# Patient Record
Sex: Male | Born: 1945
Health system: Southern US, Community
[De-identification: ages and names within clinical notes are randomized; demographics above are authoritative.]

## PROBLEM LIST (undated history)

## (undated) DIAGNOSIS — Z95810 Presence of automatic (implantable) cardiac defibrillator: Secondary | ICD-10-CM

## (undated) DIAGNOSIS — I10 Essential (primary) hypertension: Secondary | ICD-10-CM

## (undated) DIAGNOSIS — I509 Heart failure, unspecified: Secondary | ICD-10-CM

## (undated) DIAGNOSIS — K635 Polyp of colon: Secondary | ICD-10-CM

## (undated) DIAGNOSIS — E785 Hyperlipidemia, unspecified: Secondary | ICD-10-CM

## (undated) DIAGNOSIS — I428 Other cardiomyopathies: Secondary | ICD-10-CM

## (undated) DIAGNOSIS — I4891 Unspecified atrial fibrillation: Secondary | ICD-10-CM

## (undated) DIAGNOSIS — Z950 Presence of cardiac pacemaker: Secondary | ICD-10-CM

## (undated) DIAGNOSIS — I499 Cardiac arrhythmia, unspecified: Secondary | ICD-10-CM

## (undated) DIAGNOSIS — G459 Transient cerebral ischemic attack, unspecified: Secondary | ICD-10-CM

## (undated) DIAGNOSIS — E119 Type 2 diabetes mellitus without complications: Secondary | ICD-10-CM

## (undated) DIAGNOSIS — C61 Malignant neoplasm of prostate: Secondary | ICD-10-CM

## (undated) DIAGNOSIS — I639 Cerebral infarction, unspecified: Secondary | ICD-10-CM

## (undated) HISTORY — PX: PROSTATECTOMY: SHX69

## (undated) HISTORY — DX: Malignant neoplasm of prostate: C61

## (undated) HISTORY — DX: Essential (primary) hypertension: I10

## (undated) HISTORY — PX: CARDIAC CATHETERIZATION: SHX172

## (undated) HISTORY — DX: Unspecified atrial fibrillation: I48.91

## (undated) HISTORY — DX: Presence of automatic (implantable) cardiac defibrillator: Z95.810

## (undated) HISTORY — DX: Heart failure, unspecified: I50.9

## (undated) HISTORY — DX: Cerebral infarction, unspecified: I63.9

## (undated) HISTORY — PX: BACK SURGERY: SHX140

## (undated) HISTORY — PX: COLONOSCOPY: SHX174

## (undated) HISTORY — DX: Transient cerebral ischemic attack, unspecified: G45.9

## (undated) HISTORY — DX: Polyp of colon: K63.5

## (undated) HISTORY — DX: Hyperlipidemia, unspecified: E78.5

## (undated) HISTORY — DX: Other cardiomyopathies: I42.8

## (undated) HISTORY — DX: Type 2 diabetes mellitus without complications: E11.9

---

## 2010-09-11 HISTORY — PX: ICD IMPLANT: EP1208

## 2014-10-28 ENCOUNTER — Encounter: Payer: Self-pay | Admitting: Family Medicine

## 2016-09-27 ENCOUNTER — Ambulatory Visit: Payer: Self-pay | Admitting: Family Medicine

## 2016-10-11 ENCOUNTER — Encounter: Payer: Self-pay | Admitting: Family Medicine

## 2016-10-11 ENCOUNTER — Ambulatory Visit (INDEPENDENT_AMBULATORY_CARE_PROVIDER_SITE_OTHER): Payer: Managed Care, Other (non HMO) | Admitting: Family Medicine

## 2016-10-11 VITALS — BP 160/82 | HR 60 | Temp 97.7°F | Ht 73.0 in | Wt 251.0 lb

## 2016-10-11 DIAGNOSIS — I10 Essential (primary) hypertension: Secondary | ICD-10-CM

## 2016-10-11 DIAGNOSIS — I5022 Chronic systolic (congestive) heart failure: Secondary | ICD-10-CM | POA: Insufficient documentation

## 2016-10-11 DIAGNOSIS — I480 Paroxysmal atrial fibrillation: Secondary | ICD-10-CM | POA: Insufficient documentation

## 2016-10-11 DIAGNOSIS — Z23 Encounter for immunization: Secondary | ICD-10-CM | POA: Diagnosis not present

## 2016-10-11 DIAGNOSIS — Z8546 Personal history of malignant neoplasm of prostate: Secondary | ICD-10-CM

## 2016-10-11 DIAGNOSIS — Z95810 Presence of automatic (implantable) cardiac defibrillator: Secondary | ICD-10-CM | POA: Insufficient documentation

## 2016-10-11 MED ORDER — PRADAXA 150 MG PO CAPS: 150.0000 mg | ORAL_CAPSULE | Freq: Two times a day (BID) | ORAL | 3 refills | Status: DC

## 2016-10-11 NOTE — Patient Instructions (Signed)
It was a pleasure to see you today- we will look forward to taking care of you in the future.  I will get you referred to see a cardiologist  We will also work on getting your past medical records Take care and let me know if any concerns

## 2016-10-11 NOTE — Progress Notes (Signed)
Pre visit review using our clinic review tool, if applicable. No additional management support is needed unless otherwise documented below in the visit note. 

## 2016-10-11 NOTE — Progress Notes (Signed)
[facility] [address], [place], [place] [address] [phone]  Date:  10/11/2016   Name:  Bobby Bradley   DOB:  13-Aug-1946   MRN:  [id number]  PCP:  No primary care provider on file.    Chief Complaint: Referral (Pt needs referral to cardio. Pt just moved to the area from [place]. Flu vaccine today. )   History of Present Illness:  Bobby Bradley is a 71 y.o. very pleasant male patient who presents with the following:  Here today as a new patient- no notes in Epic for this pt Moved here from [place] in July.  His daughter and grands live here.   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  Heart evaluation at that time was normal.   After the air [employer] he had a back operation (disectomy and laminectomy at L5/s1), repeated a year later In 2003 he was dx with prostate cancer- he had a prostatectomy and then a mesh implant. No other treatment needed. He does annual PSA testing and has been at 0 since his operation.  He no longer sees urology  In 2012 he had TIA x2 and was found to have a fib.  He is on pradaxa for this.  He was also noted to have a reduced EF of about 35% around that time His most recent echo was about 2 years ago  He did not have an ablation- no sx of his a fib.  He goes in and out of a fib but does not have any sx of same.  He is really not bothered by this. He does not have nay CP.  He will sometimes feel like his stamina and breath is not as good as it might be  He does have an ICD Unsure of date of last colonoscopy-  5-6 years ago per his recollection He had labs done around the time of his move- does not think  he is due yet today  No bleeding with his pradaza Flu shot today  He does not that he tends to get tired more easily due to his heart condition.  He is bothered by the heat more easily than in the past   Patient Active Problem List   Diagnosis Date Noted  . Paroxysmal atrial fibrillation ([place]) 10/11/2016  . Chronic systolic congestive heart failure ([place]) 10/11/2016  . ICD (implantable cardioverter-defibrillator) in place 10/11/2016    Past Medical History:  Diagnosis Date  . A-fib ([place])   . CHF (congestive heart failure) ([place])   . Essential hypertension   . Non-ischemic cardiomyopathy ([place])   . Presence of combination internal cardiac defibrillator (ICD) and pacemaker   . Prostate cancer [facility])     Past Surgical History:  Procedure Laterality Date  . BACK SURGERY    . PROSTATECTOMY      Social History  Substance Use Topics  . Smoking status: Former Smoker    Quit date: 09/12/1983  . Smokeless tobacco: Never Used  . Alcohol use Not on file     Comment: very rare    Family History  Problem Relation Age of Onset  . Lung cancer Mother   . Uterine cancer Mother   . Lung cancer Father   . Lung cancer Brother     No Known Allergies  Medication list has been reviewed and updated.  No current outpatient prescriptions on file prior to visit.   No current facility-administered medications on file prior to visit.     Review of Systems:  As per HPI- otherwise negative.  No chest pain Admits that his BP has been 'up and down" recently.  He does not check his BP at home He has not noted any foot or leg swelling or other sign of fluid retention   Physical Examination: Blood pressure (!) 160/82, pulse 60, temperature 97.7 F (36.5 C), temperature source Oral, height 6\' 1"  (1.854 m), weight 251 lb (113.9 kg), SpO2 95 %.  Vitals:   10/11/16 1058  Weight: 251 lb (113.9 kg)  Height: 6\' 1"  (1.854 m)   Body mass index is 33.12 kg/m. Ideal Body Weight: Weight in (lb)  to have BMI = 25: 189.1  GEN: WDWN, NAD, Non-toxic, A & O x 3, overweight, looks well HEENT: Atraumatic, Normocephalic. Neck supple. No masses, No LAD.  Bilateral TM wnl, oropharynx normal.  PEERL,EOMI.   Ears and Nose: No external deformity. CV: RRR, No M/G/R. No JVD. No thrill. No extra heart sounds.  At this time seems to be in NSR with rate of approx 60 PULM: CTA B, no wheezes, crackles, rhonchi. No retractions. No resp. distress. No accessory muscle use. EXTR: No c/c/e NEURO Normal gait.  PSYCH: Normally interactive. Conversant. Not depressed or anxious appearing.  Calm demeanor.    Assessment and Plan: Paroxysmal atrial fibrillation ([place]) - Plan: Ambulatory referral to Cardiology, PRADAXA 150 MG CAPS capsule  History of prostate cancer  Chronic systolic congestive heart failure ([place]) - Plan: Ambulatory referral to Cardiology  ICD (implantable cardioverter-defibrillator) in place  Essential hypertension   Here today as a new patient to establish care Will obtain his medical records from [place] Refilled his pradaza for a fib He needs to establish care with cardiology- referral made today His BP is higher than we would like.  We have referred him to cardiology. However if appt is more than a month or so away he will see me for a BP recheck in the meantime    Signed Lamar Blinks, MD

## 2016-10-19 NOTE — Progress Notes (Signed)
HPI: 71 yo male for evaluation of atrial fibrillation and CHF. Also with prior ICD. Recently moved from [place]. Patient apparently had significant ectopy in the past and had catheterization revealing no coronary disease. He later had a TIA and was diagnosed with a cardiomyopathy and paroxysmal atrial fibrillation. I do not have records available. He moved to this area in July and presents to establish. He has some fatigue but denies dyspnea on exertion, orthopnea, PND, pedal edema, chest pain or syncope. No bleeding.  Current Outpatient Prescriptions  Medication Sig Dispense Refill  . carvedilol (COREG) 12.5 MG tablet Take 1 tablet by mouth 2 (two) times daily.    Marland Kitchen losartan (COZAAR) 100 MG tablet Take 1 tablet by mouth daily.  0  . PRADAXA 150 MG CAPS capsule Take 1 capsule (150 mg total) by mouth 2 (two) times daily. 180 capsule 3  . spironolactone (ALDACTONE) 25 MG tablet Take 1 tablet by mouth daily.  1   No current facility-administered medications for this visit.     No Known Allergies   Past Medical History:  Diagnosis Date  . A-fib ([place])   . CHF (congestive heart failure) ([place])   . Essential hypertension   . Hyperlipidemia   . Non-ischemic cardiomyopathy ([place])   . Presence of combination internal cardiac defibrillator (ICD) and pacemaker   . Prostate cancer ([place])   . TIA (transient ischemic attack)     Past Surgical History:  Procedure Laterality Date  . BACK SURGERY    . PROSTATECTOMY      Social History   Social History  . Marital status: Married    Spouse name: N/A  . Number of children: 7  . Years of education: N/A   Occupational History  . Art therapist     Social History Main Topics  . Smoking status: Former Smoker    Quit date: 09/12/1983  . Smokeless tobacco: Never Used  . Alcohol use Yes     Comment: very rare  . Drug use: No  . Sexual activity: Not on file   Other Topics Concern  . Not on file   Social History Narrative    . No narrative on file    Family History  Problem Relation Age of Onset  . Lung cancer Mother   . Uterine cancer Mother   . Lung cancer Father   . Lung cancer Brother   . Atrial fibrillation Sister     ROS: Some fatigue but no fevers or chills, productive cough, hemoptysis, dysphasia, odynophagia, melena, hematochezia, dysuria, hematuria, rash, seizure activity, orthopnea, PND, pedal edema, claudication. Remaining systems are negative.  Physical Exam:   Blood pressure (!) 156/83, pulse 63, height 6\' 1"  (1.854 m), weight 251 lb 12.8 oz (114.2 kg).  General:  Well developed/well nourished in NAD Skin warm/dry Patient not depressed No peripheral clubbing Back-normal HEENT-normal/normal eyelids Neck supple/normal carotid upstroke bilaterally; no bruits; no JVD; no thyromegaly chest - CTA/ normal expansion, ICD left chest  CV - RRR/normal S1 and S2; no murmurs, rubs or gallops;  PMI nondisplaced Abdomen -NT/ND, no HSM, no mass, + bowel sounds, positive bruit 2+ femoral pulses, no bruits Ext-no edema, chords, 2+ DP Neuro-grossly nonfocal  ECG -sinus rhythm at a rate of 63. Normal axis. First-degree AV block.  A/P  1 paroxysmal atrial fibrillation-patient is in sinus rhythm today. Continue beta blocker. Continue pradaxa. CHADsvasc 5. Check hemoglobin and renal function. We will obtain all records from [place].  2 nonischemic cardiomyopathy-obtain  all records. Continue ARB and beta blocker. Continue spironolactone. Check potassium and renal function.  3 ICD-I will arrange an evaluation by electrophysiology to follow.  4 bruit-schedule  5 hypertension-blood pressure is elevated. Add amlodipine 5 mg daily and follow.  Kirk Ruths, MD

## 2016-10-25 ENCOUNTER — Encounter: Payer: Self-pay | Admitting: Cardiology

## 2016-10-25 ENCOUNTER — Ambulatory Visit (INDEPENDENT_AMBULATORY_CARE_PROVIDER_SITE_OTHER): Payer: Managed Care, Other (non HMO) | Admitting: Cardiology

## 2016-10-25 VITALS — BP 156/83 | HR 63 | Ht 73.0 in | Wt 251.8 lb

## 2016-10-25 DIAGNOSIS — I4891 Unspecified atrial fibrillation: Secondary | ICD-10-CM

## 2016-10-25 DIAGNOSIS — I10 Essential (primary) hypertension: Secondary | ICD-10-CM

## 2016-10-25 DIAGNOSIS — R0989 Other specified symptoms and signs involving the circulatory and respiratory systems: Secondary | ICD-10-CM

## 2016-10-25 DIAGNOSIS — I420 Dilated cardiomyopathy: Secondary | ICD-10-CM

## 2016-10-25 MED ORDER — AMLODIPINE BESYLATE 5 MG PO TABS
5.0000 mg | ORAL_TABLET | Freq: Every day | ORAL | 3 refills | Status: DC
Start: 2016-10-25 — End: 2017-10-08

## 2016-10-25 NOTE — Patient Instructions (Signed)
Medication Instructions:   START AMLODIPINE 5 MG ONCE DAILY  Labwork:  Your physician recommends that you HAVE LAB WORK TODAY  Testing/Procedures:  Your physician has requested that you have an abdominal aorta duplex. During this test, an ultrasound is used to evaluate the aorta. Allow 30 minutes for this exam. Do not eat after midnight the day before and avoid carbonated beverages   Follow-Up:  Your physician wants you to follow-up in: [place] will receive a reminder letter in the mail two months in advance. If you don't receive a letter, please call our office to schedule the follow-up appointment.   APPOINTMENT WITH DR CAMNITZ TO ESTABLISH TO FOLLOW ICD

## 2016-10-26 LAB — CBC
HEMOGLOBIN: 15.2 g/dL (ref 13.0–17.7)
Hematocrit: 43.8 % (ref 37.5–51.0)
MCH: 30.6 pg (ref 26.6–33.0)
MCHC: 34.7 g/dL (ref 31.5–35.7)
MCV: 88 fL (ref 79–97)
Platelets: 241 10*3/uL (ref 150–379)
RBC: 4.96 x10E6/uL (ref 4.14–5.80)
RDW: 14 % (ref 12.3–15.4)
WBC: 8 10*3/uL (ref 3.4–10.8)

## 2016-10-26 LAB — BASIC METABOLIC PANEL
BUN/Creatinine Ratio: 18 (ref 10–24)
BUN: 16 mg/dL (ref 8–27)
CALCIUM: 9.8 mg/dL (ref 8.6–10.2)
CO2: 26 mmol/L (ref 18–29)
CREATININE: 0.9 mg/dL (ref 0.76–1.27)
Chloride: 101 mmol/L (ref 96–106)
GFR calc Af Amer: 100 mL/min/{1.73_m2} (ref >59)
GFR, EST NON AFRICAN AMERICAN: 87 mL/min/{1.73_m2} (ref >59)
GLUCOSE: 133 mg/dL — AB (ref 65–99)
Potassium: 4.8 mmol/L (ref 3.5–5.2)
Sodium: 141 mmol/L (ref 134–144)

## 2016-10-27 ENCOUNTER — Ambulatory Visit (HOSPITAL_BASED_OUTPATIENT_CLINIC_OR_DEPARTMENT_OTHER)
Admission: RE | Admit: 2016-10-27 | Discharge: 2016-10-27 | Disposition: A | Discharge status: Home / Self Care | Payer: Managed Care, Other (non HMO) | Source: Ambulatory Visit | Attending: Cardiology | Admitting: Cardiology

## 2016-10-27 DIAGNOSIS — R0989 Other specified symptoms and signs involving the circulatory and respiratory systems: Secondary | ICD-10-CM | POA: Insufficient documentation

## 2016-10-27 DIAGNOSIS — I700 Atherosclerosis of aorta: Secondary | ICD-10-CM | POA: Diagnosis not present

## 2016-11-20 ENCOUNTER — Ambulatory Visit: Payer: Self-pay | Admitting: Family Medicine

## 2016-11-21 ENCOUNTER — Telehealth: Payer: Self-pay | Admitting: Cardiology

## 2016-11-21 NOTE — Telephone Encounter (Signed)
New message    *STAT* If patient is at the pharmacy, call can be transferred to refill team.   1. Which medications need to be refilled? (please list name of each medication and dose if known) losartan (COZAAR) 100 MG tablet, carvedilol (COREG) 12.5 MG tablet  2. Which pharmacy/location (including street and city if local pharmacy) is medication to be sent to? [facility] in [place] - [address]  3. Do they need a 30 day or 90 day supply? 60 days on both

## 2016-11-22 MED ORDER — CARVEDILOL 12.5 MG PO TABS: 12.5000 mg | ORAL_TABLET | Freq: Two times a day (BID) | ORAL | 3 refills | Status: DC

## 2016-11-22 MED ORDER — LOSARTAN POTASSIUM 100 MG PO TABS: 100.0000 mg | ORAL_TABLET | Freq: Every day | ORAL | 3 refills | Status: DC

## 2016-11-22 NOTE — Telephone Encounter (Signed)
Rx sent electronically.  

## 2016-11-29 ENCOUNTER — Telehealth: Payer: Self-pay | Admitting: Cardiology

## 2016-11-29 NOTE — Telephone Encounter (Signed)
New Message     [employer] sent him a letter requesting a drug coverage review for the Pradaxa .   He just moved down her from [place] about the  carvedilol (COREG) 12.5 MG tablet Take 1 tablet (12.5 mg total) by mouth 2 (two) times daily.   This needs to be 25 mg not 12.5 mg, needs a new prescription sent to pharmacy for this

## 2016-11-29 NOTE — Telephone Encounter (Signed)
Spoke with pt states that he is taking carvedilol 25mg  not 12.5mg  ok to refill 25mg ?

## 2016-11-30 MED ORDER — CARVEDILOL 25 MG PO TABS: 25.0000 mg | ORAL_TABLET | Freq: Two times a day (BID) | ORAL | 3 refills | Status: DC

## 2016-11-30 NOTE — Telephone Encounter (Signed)
Carvedilol 25 mg refill sent

## 2016-11-30 NOTE — Telephone Encounter (Signed)
Yes Kirk Ruths, MD

## 2016-12-06 ENCOUNTER — Telehealth: Payer: Self-pay | Admitting: *Deleted

## 2016-12-06 MED ORDER — APIXABAN 5 MG PO TABS: 5.0000 mg | ORAL_TABLET | Freq: Two times a day (BID) | ORAL | 6 refills | Status: DC

## 2016-12-06 NOTE — Telephone Encounter (Signed)
Patient was started on pradaxa by his PCP in [place]. He brought a letter by the office from [place], they will not cover the pradaxa unless he tries eliquis or xarelto. Discussed with dr Stanford Breed, new script for eliquis sent to the pharmacy. Patient aware of above.

## 2017-04-13 ENCOUNTER — Telehealth: Payer: Self-pay | Admitting: *Deleted

## 2017-04-13 NOTE — Telephone Encounter (Signed)
Received Medical records from [facility], [place], [place], forwarded to provider/SLS 08/03

## 2017-04-16 ENCOUNTER — Encounter: Payer: Self-pay | Admitting: Family Medicine

## 2017-04-16 DIAGNOSIS — I429 Cardiomyopathy, unspecified: Secondary | ICD-10-CM | POA: Insufficient documentation

## 2017-04-16 DIAGNOSIS — Z7901 Long term (current) use of anticoagulants: Secondary | ICD-10-CM | POA: Insufficient documentation

## 2017-04-16 DIAGNOSIS — Z8673 Personal history of transient ischemic attack (TIA), and cerebral infarction without residual deficits: Secondary | ICD-10-CM | POA: Insufficient documentation

## 2017-04-30 ENCOUNTER — Telehealth: Payer: Self-pay | Admitting: *Deleted

## 2017-04-30 NOTE — Telephone Encounter (Signed)
Received Medical records from [facility]; forwarded to provider/SLS 08/20

## 2017-05-01 ENCOUNTER — Telehealth: Payer: Self-pay | Admitting: Family Medicine

## 2017-05-01 NOTE — Telephone Encounter (Signed)
Also received notes from his PCP from [place], Dr. Fredonia Highland

## 2017-05-01 NOTE — Telephone Encounter (Signed)
Received cardiology records from [place], [facility], Dr. Perrin Smack Will abstract/ scan as needed

## 2017-05-15 ENCOUNTER — Other Ambulatory Visit: Payer: Self-pay | Admitting: *Deleted

## 2017-05-15 MED ORDER — SPIRONOLACTONE 25 MG PO TABS: 25.0000 mg | ORAL_TABLET | Freq: Every day | ORAL | 1 refills | Status: DC

## 2017-05-15 NOTE — Telephone Encounter (Signed)
REFILL 

## 2017-06-04 ENCOUNTER — Ambulatory Visit (INDEPENDENT_AMBULATORY_CARE_PROVIDER_SITE_OTHER): Payer: Managed Care, Other (non HMO) | Admitting: Cardiology

## 2017-06-04 ENCOUNTER — Encounter (INDEPENDENT_AMBULATORY_CARE_PROVIDER_SITE_OTHER): Payer: Self-pay

## 2017-06-04 ENCOUNTER — Encounter: Payer: Self-pay | Admitting: Cardiology

## 2017-06-04 VITALS — BP 130/78 | HR 61 | Ht 72.0 in | Wt 249.6 lb

## 2017-06-04 DIAGNOSIS — I428 Other cardiomyopathies: Secondary | ICD-10-CM | POA: Diagnosis not present

## 2017-06-04 DIAGNOSIS — Z95810 Presence of automatic (implantable) cardiac defibrillator: Secondary | ICD-10-CM

## 2017-06-04 DIAGNOSIS — I483 Typical atrial flutter: Secondary | ICD-10-CM

## 2017-06-04 DIAGNOSIS — I10 Essential (primary) hypertension: Secondary | ICD-10-CM

## 2017-06-04 DIAGNOSIS — I480 Paroxysmal atrial fibrillation: Secondary | ICD-10-CM

## 2017-06-04 DIAGNOSIS — I5022 Chronic systolic (congestive) heart failure: Secondary | ICD-10-CM

## 2017-06-04 LAB — CUP PACEART INCLINIC DEVICE CHECK
Date Time Interrogation Session: 20180924040000
HighPow Impedance: 53 Ohm
Implantable Lead Serial Number: 363165
Implantable Pulse Generator Implant Date: 20160308
Lead Channel Impedance Value: 424 Ohm
Lead Channel Sensing Intrinsic Amplitude: 23.8 mV
Lead Channel Setting Pacing Amplitude: 2.4 V
Lead Channel Setting Pacing Pulse Width: 0.5 ms
MDC IDC LEAD IMPLANT DT: 20160308
MDC IDC LEAD LOCATION: 753860
MDC IDC MSMT LEADCHNL RV PACING THRESHOLD AMPLITUDE: 0.8 V
MDC IDC MSMT LEADCHNL RV PACING THRESHOLD PULSEWIDTH: 0.5 ms
MDC IDC PG SERIAL: 200269
MDC IDC SET LEADCHNL RV SENSING SENSITIVITY: 0.6 mV
MDC IDC STAT BRADY RV PERCENT PACED: 1 % — AB

## 2017-06-04 NOTE — Patient Instructions (Signed)
Medication Instructions: Your physician recommends that you continue on your current medications as directed. Please refer to the Current Medication list given to you today.   Labwork: None ordered  Procedures/Testing: None orderd  Follow-Up: Remote monitoring is used to monitor your Pacemaker of ICD from home. This monitoring reduces the number of office visits required to check your device to one time per year. It allows [place] to keep an eye on the functioning of your device to ensure it is working properly. You are scheduled for a device check from home on 09/05/17. You may send your transmission at any time that day. If you have a wireless device, the transmission will be sent automatically. After your physician reviews your transmission, you will receive a postcard with your next transmission date.     Your physician wants you to follow-up in: 1 year with Dr. Curt Bears.  You will receive a reminder letter in the mail two months in advance. If you don't receive a letter, please call our office to schedule the follow-up appointment.   Any Additional Special Instructions Will Be Listed Below (If Applicable).     If you need a refill on your cardiac medications before your next appointment, please call your pharmacy.

## 2017-06-04 NOTE — Progress Notes (Signed)
Electrophysiology Office Note   Date:  06/04/2017   ID:  Bobby Bradley, DOB [birth year], MRN [id number]  PCP:  Bobby Mclean, MD  Cardiologist:  Bobby Bradley Primary Electrophysiologist:  Bobby Scales Meredith Leeds, MD    Chief Complaint  Patient presents with  . Dsfib Check    Nonischemic cardiomyopathy/Chronic systolic CHF     History of Present Illness: Bobby Bradley is a 71 y.o. male who is being seen today for the evaluation of CHF, AF at the request of Copland, Bobby Filler, MD. Presenting today for electrophysiology evaluation. He has a history of atrial fibrillation and CHF status post Bobby Bradley. He apparently had significant ectopy in the past with catheterization revealing no coronary artery disease. He had a TIA and was diagnosed with cardiomyopathy and atrial fibrillation. His ICD was implanted in 2016.    Today, he denies symptoms of palpitations, chest pain, shortness of breath, orthopnea, PND, lower extremity edema, claudication, dizziness, presyncope, syncope, bleeding, or neurologic sequela. The patient is tolerating medications without difficulties.    Past Medical History:  Diagnosis Date  . A-fib (Bobby Bradley)   . CHF (congestive heart failure) (Bobby Bradley)   . Essential hypertension   . Hyperlipidemia   . Non-ischemic cardiomyopathy (Bobby Bradley)   . Presence of combination internal cardiac defibrillator (ICD) and pacemaker   . Prostate cancer (Bobby Bradley)   . TIA (transient ischemic attack)    Past Surgical History:  Procedure Laterality Date  . BACK SURGERY    . PROSTATECTOMY       Current Outpatient Prescriptions  Medication Sig Dispense Refill  . amLODipine (NORVASC) 5 MG tablet Take 1 tablet (5 mg total) by mouth daily. 90 tablet 3  . apixaban (ELIQUIS) 5 MG TABS tablet Take 1 tablet (5 mg total) by mouth 2 (two) times daily. 60 tablet 6  . carvedilol (COREG) 25 MG tablet Take 1 tablet (25 mg total) by mouth 2 (two) times daily. 90 tablet 3  . losartan (COZAAR)  100 MG tablet Take 1 tablet (100 mg total) by mouth daily. 60 tablet 3  . Multiple Vitamin (MULTI VITAMIN DAILY PO) Take 1 tablet by mouth daily.    Bobby Bradley spironolactone (ALDACTONE) 25 MG tablet Take 1 tablet (25 mg total) by mouth daily. 90 tablet 1   No current facility-administered medications for this visit.     Allergies:   Lisinopril   Social History:  The patient  reports that he quit smoking about 33 years ago. He has never used smokeless tobacco. He reports that he drinks alcohol. He reports that he does not use drugs.   Family History:  The patient's family history includes Atrial fibrillation in his sister; Lung cancer in his brother, father, and mother; Uterine cancer in his mother.    ROS:  Please see the history of present illness.   Otherwise, review of systems is positive for none.   All other systems are reviewed and negative.    PHYSICAL EXAM: VS:  BP 130/78   Pulse 61   Ht 6' (1.829 m)   Wt 249 lb 9.6 oz (113.2 kg)   BMI 33.85 kg/m  , BMI Body mass index is 33.85 kg/m. GEN: Well nourished, well developed, in no acute distress  HEENT: normal  Neck: no JVD, carotid bruits, or masses Cardiac: RRR; no murmurs, rubs, or gallops,no edema  Respiratory:  clear to auscultation bilaterally, normal work of breathing GI: soft, nontender, nondistended, + BS MS: no deformity or atrophy  Skin: warm and dry,  device pocket is well healed Neuro:  Strength and sensation are intact Psych: euthymic mood, full affect  EKG:  EKG is ordered today. Personal review of the ekg ordered shows sinus rhythm, 1 degree AV block  Device interrogation is reviewed today in detail.  See PaceArt for details.   Recent Labs: 10/25/2016: BUN 16; Creatinine, Ser 0.90; Hemoglobin 15.2; Platelets 241; Potassium 4.8; Sodium 141    Lipid Panel  No results found for: CHOL, TRIG, HDL, CHOLHDL, VLDL, LDLCALC, LDLDIRECT   Wt Readings from Last 3 Encounters:  06/04/17 249 lb 9.6 oz (113.2 kg)    10/25/16 251 lb 12.8 oz (114.2 kg)  10/11/16 251 lb (113.9 kg)      Other studies Reviewed: Additional studies/ records that were reviewed today include: TTE 2015  Review of the above records today demonstrates:  The left ventricular wall motion is normal. The mitral inflow pattern is consistent with Diastolic Dysfunction. There is mild concentric left ventricular hypertrophy. The left ventricular function is moderately reduced. Overall left ventricular ejection fraction is estimated to be 35%. The right atrium is dilated. The left atrium is dilated. There is mild mitral regurgitation. Mild tricuspid regurgitation is present.   ASSESSMENT AND PLAN:  1.  Chronic systolic heart failure due to nonischemic cardiomyopathy: NYHA class II. Status post [employer] single-chamber ICD. Currently on optimal medical therapy with carvedilol, Aldactone and losartan. Feeling well without issues. No signs of volume overload.  2. Atrial fibrillation/flutter: In sinus rhythm today. Anticoagulated with Eliquis.  This patients CHA2DS2-VASc Score and unadjusted Ischemic Stroke Rate (% per year) is equal to 3.2 % stroke rate/year from a score of 3  Above score calculated as 1 point each if present [CHF, HTN, DM, Vascular=MI/PAD/Aortic Plaque, Age if 65-74, or Male] Above score calculated as 2 points each if present [Age > 75, or Stroke/TIA/TE]  3. Essential hypertension: Blood pressure well controlled today. No changes.   Current medicines are reviewed at length with the patient today.   The patient does not have concerns regarding his medicines.  The following changes were made today:  none  Labs/ tests ordered today include:  Orders Placed This Encounter  Procedures  . EKG 12-Lead     Disposition:   FU with Bobby Bradley 1 year  Signed, Farhiya Rosten Meredith Leeds, MD  06/04/2017 8:59 AM     [facility] [address] [place] [address] [phone]  (office) [phone] (fax)

## 2017-06-08 ENCOUNTER — Telehealth: Payer: Self-pay | Admitting: *Deleted

## 2017-06-08 NOTE — Telephone Encounter (Signed)
LMOVM requesting call back to the [facility].  Latitude monitoring successfully transferred to our clinic but home monitor has not connected since 04/16/17.  Will determine if patient needs cell/internet adapter.

## 2017-06-11 ENCOUNTER — Other Ambulatory Visit: Payer: Self-pay | Admitting: Cardiology

## 2017-06-11 NOTE — Telephone Encounter (Signed)
REFILL 

## 2017-06-12 NOTE — Telephone Encounter (Signed)
Spoke w/ pt and attempted to help him send a remote transmission w/ his land line phone. Transmission was not successful. Instructed pt that I would order him a cell adapter. Pt verbalized understanding.

## 2017-07-06 ENCOUNTER — Other Ambulatory Visit: Payer: Self-pay | Admitting: Pharmacist Clinician (PhC)/ Clinical Pharmacy Specialist

## 2017-07-06 MED ORDER — APIXABAN 5 MG PO TABS
5.0000 mg | ORAL_TABLET | Freq: Two times a day (BID) | ORAL | 1 refills | Status: DC
Start: 2017-07-06 — End: 2018-01-10

## 2017-07-09 ENCOUNTER — Other Ambulatory Visit: Payer: Self-pay | Admitting: Cardiology

## 2017-07-09 NOTE — Telephone Encounter (Signed)
REFILL 

## 2017-09-05 ENCOUNTER — Ambulatory Visit (INDEPENDENT_AMBULATORY_CARE_PROVIDER_SITE_OTHER): Payer: Managed Care, Other (non HMO) | Admitting: *Deleted

## 2017-09-05 DIAGNOSIS — I428 Other cardiomyopathies: Secondary | ICD-10-CM | POA: Diagnosis not present

## 2017-09-06 ENCOUNTER — Encounter: Payer: Self-pay | Admitting: Cardiology

## 2017-09-06 NOTE — Progress Notes (Signed)
Remote ICD transmission.   

## 2017-09-17 LAB — CUP PACEART REMOTE DEVICE CHECK
Battery Remaining Longevity: 144 mo
Date Time Interrogation Session: 20181226090100
HighPow Impedance: 54 Ohm
Implantable Lead Implant Date: 20160308
Implantable Lead Location: 753860
Implantable Lead Serial Number: 363165
Implantable Pulse Generator Implant Date: 20160308
Lead Channel Pacing Threshold Pulse Width: 0.5 ms
MDC IDC MSMT BATTERY REMAINING PERCENTAGE: 100 %
MDC IDC MSMT LEADCHNL RV IMPEDANCE VALUE: 429 Ohm
MDC IDC MSMT LEADCHNL RV PACING THRESHOLD AMPLITUDE: 0.8 V
MDC IDC PG SERIAL: 200269
MDC IDC SET LEADCHNL RV PACING AMPLITUDE: 2.4 V
MDC IDC SET LEADCHNL RV PACING PULSEWIDTH: 0.5 ms
MDC IDC SET LEADCHNL RV SENSING SENSITIVITY: 0.6 mV
MDC IDC STAT BRADY RV PERCENT PACED: 1 %

## 2017-10-08 ENCOUNTER — Other Ambulatory Visit: Payer: Self-pay

## 2017-10-08 MED ORDER — AMLODIPINE BESYLATE 5 MG PO TABS: 5.0000 mg | ORAL_TABLET | Freq: Every day | ORAL | 0 refills | Status: DC

## 2017-10-08 NOTE — Telephone Encounter (Signed)
Rx(s) sent to pharmacy electronically.  

## 2017-11-12 ENCOUNTER — Other Ambulatory Visit: Payer: Self-pay | Admitting: Cardiology

## 2017-11-12 NOTE — Telephone Encounter (Signed)
refill 

## 2017-12-05 ENCOUNTER — Encounter: Payer: Self-pay | Admitting: *Deleted

## 2017-12-05 ENCOUNTER — Telehealth: Payer: Self-pay | Admitting: Cardiology

## 2017-12-05 NOTE — Telephone Encounter (Signed)
LMOVM reminding pt to send remote transmission.   

## 2017-12-07 ENCOUNTER — Ambulatory Visit (INDEPENDENT_AMBULATORY_CARE_PROVIDER_SITE_OTHER): Payer: 59 | Admitting: *Deleted

## 2017-12-07 ENCOUNTER — Encounter: Payer: Self-pay | Admitting: Cardiology

## 2017-12-07 DIAGNOSIS — I428 Other cardiomyopathies: Secondary | ICD-10-CM

## 2017-12-10 ENCOUNTER — Encounter: Payer: Self-pay | Admitting: Cardiology

## 2017-12-10 NOTE — Progress Notes (Signed)
Remote ICD transmission.   

## 2017-12-15 ENCOUNTER — Encounter: Payer: Self-pay | Admitting: Family Medicine

## 2017-12-15 ENCOUNTER — Ambulatory Visit (INDEPENDENT_AMBULATORY_CARE_PROVIDER_SITE_OTHER): Payer: 59 | Admitting: Family Medicine

## 2017-12-15 VITALS — BP 144/84 | HR 60 | Temp 98.3°F | Wt 253.0 lb

## 2017-12-15 DIAGNOSIS — J069 Acute upper respiratory infection, unspecified: Secondary | ICD-10-CM | POA: Diagnosis not present

## 2017-12-15 DIAGNOSIS — B9789 Other viral agents as the cause of diseases classified elsewhere: Secondary | ICD-10-CM

## 2017-12-15 MED ORDER — GUAIFENESIN-CODEINE 100-10 MG/5ML PO SYRP: 10.0000 mL | ORAL_SOLUTION | Freq: Three times a day (TID) | ORAL | 0 refills | Status: DC | PRN

## 2017-12-15 MED ORDER — FLUTICASONE PROPIONATE 50 MCG/ACT NA SUSP: 2.0000 | Freq: Every day | NASAL | 6 refills | Status: DC

## 2017-12-15 MED ORDER — ALBUTEROL SULFATE HFA 108 (90 BASE) MCG/ACT IN AERS: 2.0000 | INHALATION_SPRAY | Freq: Four times a day (QID) | RESPIRATORY_TRACT | 0 refills | Status: DC | PRN

## 2017-12-15 NOTE — Patient Instructions (Signed)
Follow up as needed or as scheduled CONTINUE the Zyrtec daily START the Flonase- 2 sprays each nostril daily Drink plenty of fluids REST! Use the cough syrup as needed- may cause drowsiness Use the Albuterol inhaler- 2 puffs every 4-6 hrs as needed for cough, wheezing, or shortness of breath Call with any questions or concerns Hang in there!!!

## 2017-12-15 NOTE — Progress Notes (Signed)
   Subjective:    Patient ID: Bobby Bradley, male    DOB: Jan 24, 1946, 72 y.o.   MRN: [id number]  HPI URI- pt reports he gets sick every time he visits his son who has cats, to which he is allergic.  sxs started ~2 weeks ago.  No fevers.  Denies sinus pain/pressure.  Yesterday had R ear fullness.  + nasal congestion and cough.  Taking Robitussin w/ some relief.  Cough is worse at night.  No known sick contacts but has been flying.  No tooth pain.  Cough is productive of green sputum.  Pt has hx of this previously.  Taking Zyrtec daily.   Review of Systems For ROS see HPI     Objective:   Physical Exam  Constitutional: He appears well-developed and well-nourished. No distress.  HENT:  Head: Normocephalic and atraumatic.  No TTP over sinuses + turbinate edema + PND TMs normal bilaterally  Eyes: Pupils are equal, round, and reactive to light. Conjunctivae and EOM are normal.  Neck: Normal range of motion. Neck supple.  Cardiovascular: Normal rate, regular rhythm and normal heart sounds.  Pulmonary/Chest: Effort normal. No respiratory distress. He has wheezes (faint wheezes at lung bases bilaterally).  No cough heard during visit  Lymphadenopathy:    He has no cervical adenopathy.  Skin: Skin is warm and dry.  Vitals reviewed.         Assessment & Plan:  Viral URI- new.  Pt doesn't have any evidence of bacterial infxn on PE.  Appears to be a viral/allergy combo triggered by his exposure to cats.  Add Flonase to daily Zyrtec.  Albuterol as needed due to faint wheezing. Cough syrup prn.  Reviewed supportive care and red flags that should prompt return. s Pt expressed understanding and is in agreement w/ plan.

## 2017-12-25 LAB — CUP PACEART REMOTE DEVICE CHECK
Battery Remaining Percentage: 100 %
Brady Statistic RV Percent Paced: 1 %
Date Time Interrogation Session: 20190330031900
HIGH POWER IMPEDANCE MEASURED VALUE: 47 Ohm
Implantable Lead Implant Date: 20160308
Implantable Lead Serial Number: 363165
Implantable Pulse Generator Implant Date: 20160308
Lead Channel Impedance Value: 394 Ohm
Lead Channel Pacing Threshold Amplitude: 0.8 V
Lead Channel Pacing Threshold Pulse Width: 0.5 ms
Lead Channel Setting Pacing Amplitude: 2.4 V
Lead Channel Setting Sensing Sensitivity: 0.6 mV
MDC IDC LEAD LOCATION: 753860
MDC IDC MSMT BATTERY REMAINING LONGEVITY: 144 mo
MDC IDC SET LEADCHNL RV PACING PULSEWIDTH: 0.5 ms
Pulse Gen Serial Number: 200269

## 2018-01-07 ENCOUNTER — Ambulatory Visit (HOSPITAL_BASED_OUTPATIENT_CLINIC_OR_DEPARTMENT_OTHER)
Admission: RE | Admit: 2018-01-07 | Discharge: 2018-01-07 | Disposition: A | Discharge status: Home / Self Care | Payer: 59 | Source: Ambulatory Visit | Attending: Family Medicine | Admitting: Family Medicine

## 2018-01-07 ENCOUNTER — Encounter: Payer: Self-pay | Admitting: Family Medicine

## 2018-01-07 ENCOUNTER — Other Ambulatory Visit: Payer: Self-pay | Admitting: Cardiology

## 2018-01-07 ENCOUNTER — Ambulatory Visit: Payer: 59 | Admitting: Family Medicine

## 2018-01-07 VITALS — BP 132/78 | HR 64 | Temp 98.7°F | Resp 16 | Ht 72.0 in | Wt 250.0 lb

## 2018-01-07 DIAGNOSIS — M5431 Sciatica, right side: Secondary | ICD-10-CM

## 2018-01-07 DIAGNOSIS — G8929 Other chronic pain: Secondary | ICD-10-CM | POA: Diagnosis not present

## 2018-01-07 DIAGNOSIS — M5441 Lumbago with sciatica, right side: Secondary | ICD-10-CM | POA: Diagnosis not present

## 2018-01-07 DIAGNOSIS — I700 Atherosclerosis of aorta: Secondary | ICD-10-CM | POA: Diagnosis not present

## 2018-01-07 DIAGNOSIS — K8020 Calculus of gallbladder without cholecystitis without obstruction: Secondary | ICD-10-CM | POA: Diagnosis not present

## 2018-01-07 MED ORDER — PREDNISONE 20 MG PO TABS: ORAL_TABLET | ORAL | 0 refills | Status: DC

## 2018-01-07 MED ORDER — CYCLOBENZAPRINE HCL 10 MG PO TABS: 10.0000 mg | ORAL_TABLET | Freq: Two times a day (BID) | ORAL | 0 refills | Status: DC | PRN

## 2018-01-07 NOTE — Progress Notes (Addendum)
[place] [facility] [address], [place], [place] [address] [phone]  Date:  01/07/2018   Name:  Bobby Bradley   DOB:  Apr 26, 1946   MRN:  [id number]  PCP:  Darreld Mclean, MD    Chief Complaint: No chief complaint on file.   History of Present Illness:  Bobby Bradley is a 72 y.o. very pleasant male patient who presents with the following: Last seen by myself in 1/18 Here today with low back pain- history of a fib, cardiomyopathy, ICD, TIA, prostate cancer Visit with Dr. Curt Bears in September:  ASSESSMENT AND PLAN: 1.  Chronic systolic heart failure due to nonischemic cardiomyopathy: NYHA class II. Status post [employer] single-chamber ICD. Currently on optimal medical therapy with carvedilol, Aldactone and losartan. Feeling well without issues. No signs of volume overload. 2. Atrial fibrillation/flutter: In sinus rhythm today. Anticoagulated with Eliquis. This patients CHA2DS2-VASc Score and unadjusted Ischemic Stroke Rate (% per year) is equal to 3.2 % stroke rate/year from a score of 3 Above score calculated as 1 point each if present [CHF, HTN, DM, Vascular=MI/PAD/Aortic Plaque, Age if 65-74, or Male] Above score calculated as 2 points each if present [Age > 75, or Stroke/TIA/TE] 3. Essential hypertension: Blood pressure well controlled today. No changes. Current medicines are reviewed at length with the patient today.   The patient does not have concerns regarding his medicines.  The following changes were made today:  none  For the last year he has noted some issues with his right leg off an on- pain from the buttock down the lateral calf. He suspected sciatica. He also more recently started to notice some right sided lower back pain He notes that it is harder for him to lift his right leg out of a car to get out of a passenger side. He recently went on quite a long car trip. He felt ok the day of the  drive and the day after, but 2 days later he had pain running down his right leg and was quite stiff.  He is sore and stiff in the am- has to take a hot shower and some tylenol to get going   He did have an operation at L5/S1 in the 1995.  This does not really remind him of that time.  He had a repeat operation a year later "because I didn't follow doctor's orders."   He feels a bit numb down the right lateral calf  No bowel or bladder control issues that are new- he did have surgery for prostate cancer so he has some baseline urinary continence problems He has tried tylenol and heat wraps so far   Back surgery was done in [place] - he does not have a back specialist here  Patient Active Problem List   Diagnosis Date Noted  . Cardiomyopathy ([place]) 04/16/2017  . History of TIA (transient ischemic attack) 04/16/2017  . Chronic anticoagulation 04/16/2017  . Paroxysmal atrial fibrillation ([place]) 10/11/2016  . Chronic systolic congestive heart failure ([place]) 10/11/2016  . ICD (implantable cardioverter-defibrillator) in place 10/11/2016  . History of prostate cancer 10/11/2016  . Essential hypertension 10/11/2016    Past Medical History:  Diagnosis Date  . A-fib ([place])   . CHF (congestive heart failure) ([place])   . Essential hypertension   . Hyperlipidemia   . Non-ischemic cardiomyopathy ([place])   . Presence of combination internal cardiac defibrillator (ICD) and pacemaker   . Prostate cancer ([place])   .  TIA (transient ischemic attack)     Past Surgical History:  Procedure Laterality Date  . BACK SURGERY    . PROSTATECTOMY      Social History   Tobacco Use  . Smoking status: Former Smoker    Last attempt to quit: 09/12/1983    Years since quitting: 34.3  . Smokeless tobacco: Never Used  Substance Use Topics  . Alcohol use: Yes    Comment: very rare  . Drug use: No    Family History  Problem Relation Age of Onset  . Lung cancer Mother   . Uterine cancer Mother   . Lung cancer  Father   . Lung cancer Brother   . Atrial fibrillation Sister     Allergies  Allergen Reactions  . Lisinopril     Extreme diarrhea    Medication list has been reviewed and updated.  Current Outpatient Medications on File Prior to Visit  Medication Sig Dispense Refill  . apixaban (ELIQUIS) 5 MG TABS tablet Take 1 tablet (5 mg total) by mouth 2 (two) times daily. 180 tablet 1  . carvedilol (COREG) 25 MG tablet TAKE 1 TABLET(25 MG) BY MOUTH TWICE DAILY 60 tablet 11  . fluticasone (FLONASE) 50 MCG/ACT nasal spray Place 2 sprays into both nostrils daily. 16 g 6  . losartan (COZAAR) 100 MG tablet TAKE 1 TABLET(100 MG) BY MOUTH DAILY 60 tablet 11  . Multiple Vitamin (MULTI VITAMIN DAILY PO) Take 1 tablet by mouth daily.    Marland Kitchen spironolactone (ALDACTONE) 25 MG tablet TAKE 1 TABLET(25 MG) BY MOUTH DAILY 90 tablet 2  . albuterol (PROVENTIL HFA;VENTOLIN HFA) 108 (90 Base) MCG/ACT inhaler Inhale 2 puffs into the lungs every 6 (six) hours as needed for wheezing or shortness of breath. (Patient not taking: Reported on 01/07/2018) 1 Inhaler 0  . amLODipine (NORVASC) 5 MG tablet Take 1 tablet (5 mg total) by mouth daily. NEEDS APPOINTMENT WITH DR Stanford Breed FOR FUTURE REFILLS 90 tablet 0  . guaiFENesin-codeine (ROBITUSSIN AC) 100-10 MG/5ML syrup Take 10 mLs by mouth 3 (three) times daily as needed for cough. (Patient not taking: Reported on 01/07/2018) 240 mL 0   No current facility-administered medications on file prior to visit.     Review of Systems:  As per HPI- otherwise negative. No fever or chills No rash    Physical Examination: Vitals:   01/07/18 1331  BP: 132/78  Pulse: 64  Resp: 16  Temp: 98.7 F (37.1 C)  SpO2: 94%   Vitals:   01/07/18 1331  Weight: 250 lb (113.4 kg)  Height: 6' (1.829 m)   Body mass index is 33.91 kg/m. Ideal Body Weight: Weight in (lb) to have BMI = 25: 183.9  GEN: WDWN, NAD, Non-toxic, A & O x 3, tall, overweight, looks well  HEENT: Atraumatic,  Normocephalic. Neck supple. No masses, No LAD. Ears and Nose: No external deformity. CV: RRR, No M/G/R. No JVD. No thrill. No extra heart sounds. PULM: CTA B, no wheezes, crackles, rhonchi. No retractions. No resp. distress. No accessory muscle use. ABD: S, NT, ND, +BS. No rebound. No HSM. EXTR: No c/c/e NEURO Normal gait.  PSYCH: Normally interactive. Conversant. Not depressed or anxious appearing.  Calm demeanor.  Midline lumbar scar from previous operations, no tenderness over bony spine or sciatic notches bilaterally He does have some spasm and soreness in the right Paraspinous muscles  Able to raise on toes Lumbar flexion is slightly restricted due to pain Extension is normal Positive SLR on the  right only Reduced but symmetrical DTR bilaterally at knees   Assessment and Plan: Chronic right-sided low back pain with right-sided sciatica - Plan: predniSONE (DELTASONE) 20 MG tablet, cyclobenzaprine (FLEXERIL) 10 MG tablet  Sciatica, right side - Plan: DG Lumbar Spine Complete  History of back problems since college and s/p 2 spine operations Here today with right sided sciatica symptoms Conservative prednisone taper given his history of cardiomyopathy Cannot get MRI due to ICD Flexeril to use as needed, cautioned regarding sedation - then later noted possible issue with using flexeril in patients with CHF. Pt alerted- I will touch base with Dr. Curt Bears prior to use   Signed Lamar Blinks, MD  4/30:  Dr. Curt Bears kindly replied to my message- he inquired with the cardiology pharmacy team and they were not aware of any contraindication to his using flexeril  Called pt and LMOM with this update, and briefly discussed his lumbar report.  However will need to call him back to discuss in further detail  Also received his lumbar film report: Dg Lumbar Spine Complete  Result Date: 01/08/2018 CLINICAL DATA:  72 year old male with low back pain and right radiculopathy for the past  week. No injury. Initial encounter. EXAM: LUMBAR SPINE - COMPLETE 4+ VIEW COMPARISON:  None. FINDINGS: Straightening of the lumbar spine. Moderate-to-marked L4-5 and L5-S1 disc space narrowing with bilateral facet degenerative changes. Mild L1-2 and L3-4 disc space narrowing. No compression fracture. Five gallstones. Possible 8 mm left renal calculus. Vascular calcifications. IMPRESSION: Moderate-to-marked L4-5 and L5-S1 disc space narrowing with bilateral facet degenerative changes. Mild L1-2 and L3-4 disc space narrowing. Five gallstones. Possible 8 mm left renal calculus. Aortic Atherosclerosis (ICD10-I70.0). Electronically Signed   By: Genia Del M.D.   On: 01/08/2018 06:37

## 2018-01-07 NOTE — Patient Instructions (Signed)
Good to see you today- I am sorry that your back and leg are bothering you!   We will get x-rays today and try some medication for your symptoms Flexeril as needed for pain and spasm- remember this will make you drowsy- do not use when you need to drive! Prednisone for inflammation and swelling of your sciatic nerve  I'll be in touch with your x-ray reports asap; if your symptoms fail to resolve or come right back we may need to pursue a CT or specialist referral

## 2018-01-08 ENCOUNTER — Other Ambulatory Visit: Payer: Self-pay | Admitting: Cardiology

## 2018-01-08 ENCOUNTER — Encounter: Payer: Self-pay | Admitting: Cardiology

## 2018-01-08 ENCOUNTER — Ambulatory Visit: Payer: 59 | Admitting: Cardiology

## 2018-01-08 VITALS — BP 144/74 | HR 60 | Ht 72.0 in | Wt 248.6 lb

## 2018-01-08 DIAGNOSIS — I428 Other cardiomyopathies: Secondary | ICD-10-CM | POA: Diagnosis not present

## 2018-01-08 DIAGNOSIS — I480 Paroxysmal atrial fibrillation: Secondary | ICD-10-CM

## 2018-01-08 DIAGNOSIS — I10 Essential (primary) hypertension: Secondary | ICD-10-CM | POA: Diagnosis not present

## 2018-01-08 MED ORDER — AMLODIPINE BESYLATE 10 MG PO TABS: 10.0000 mg | ORAL_TABLET | Freq: Every day | ORAL | 3 refills | Status: DC

## 2018-01-08 NOTE — Progress Notes (Signed)
HPI: FU atrial fibrillation and CHF. Also with prior ICD. Recently moved from [place]. Patient apparently had significant ectopy in the past and had catheterization revealing no coronary disease. He later had a TIA and was diagnosed with a cardiomyopathy and paroxysmal atrial fibrillation.  Echocardiogram December 2015 showed ejection fraction 35%, mild left ventricular hypertrophy, diastolic dysfunction, biatrial enlargement, mild mitral and tricuspid regurgitation.  Abdominal ultrasound February 2018 showed no aneurysm.  Since last seen the patient has dyspnea with more extreme activities but not with routine activities. It is relieved with rest. It is not associated with chest pain. There is no orthopnea, PND or pedal edema. There is no syncope or palpitations. There is no exertional chest pain.   Current Outpatient Medications  Medication Sig Dispense Refill  . amLODipine (NORVASC) 5 MG tablet Take 1 tablet (5 mg total) by mouth daily. Please call [phone] to schedule appointment for additional refills thanks. (2nd attempt) 30 tablet 0  . apixaban (ELIQUIS) 5 MG TABS tablet Take 1 tablet (5 mg total) by mouth 2 (two) times daily. 180 tablet 1  . carvedilol (COREG) 25 MG tablet TAKE 1 TABLET(25 MG) BY MOUTH TWICE DAILY 60 tablet 11  . cyclobenzaprine (FLEXERIL) 10 MG tablet Take 1 tablet (10 mg total) by mouth 2 (two) times daily as needed for muscle spasms. 30 tablet 0  . fluticasone (FLONASE) 50 MCG/ACT nasal spray Place 2 sprays into both nostrils daily. 16 g 6  . guaiFENesin-codeine (ROBITUSSIN AC) 100-10 MG/5ML syrup Take 10 mLs by mouth 3 (three) times daily as needed for cough. 240 mL 0  . losartan (COZAAR) 100 MG tablet TAKE 1 TABLET(100 MG) BY MOUTH DAILY 60 tablet 11  . Multiple Vitamin (MULTI VITAMIN DAILY PO) Take 1 tablet by mouth daily.    . predniSONE (DELTASONE) 20 MG tablet Take 2 pills daily for 4 days, then 1 pill daily for 3 days 11 tablet 0  . spironolactone  (ALDACTONE) 25 MG tablet TAKE 1 TABLET(25 MG) BY MOUTH DAILY 90 tablet 2   No current facility-administered medications for this visit.      Past Medical History:  Diagnosis Date  . A-fib ([place])   . CHF (congestive heart failure) ([place])   . Essential hypertension   . Hyperlipidemia   . Non-ischemic cardiomyopathy ([place])   . Presence of combination internal cardiac defibrillator (ICD) and pacemaker   . Prostate cancer ([place])   . TIA (transient ischemic attack)     Past Surgical History:  Procedure Laterality Date  . BACK SURGERY    . PROSTATECTOMY      Social History   Socioeconomic History  . Marital status: Married    Spouse name: Not on file  . Number of children: 7  . Years of education: Not on file  . Highest education level: Not on file  Occupational History  . Occupation: Art therapist   Social Needs  . Financial resource strain: Not on file  . Food insecurity:    Worry: Not on file    Inability: Not on file  . Transportation needs:    Medical: Not on file    Non-medical: Not on file  Tobacco Use  . Smoking status: Former Smoker    Last attempt to quit: 09/12/1983    Years since quitting: 34.3  . Smokeless tobacco: Never Used  Substance and Sexual Activity  . Alcohol use: Yes    Comment: very rare  . Drug use: No  .  Sexual activity: Not on file  Lifestyle  . Physical activity:    Days per week: Not on file    Minutes per session: Not on file  . Stress: Not on file  Relationships  . Social connections:    Talks on phone: Not on file    Gets together: Not on file    Attends religious service: Not on file    Active member of club or organization: Not on file    Attends meetings of clubs or organizations: Not on file    Relationship status: Not on file  . Intimate partner violence:    Fear of current or ex partner: Not on file    Emotionally abused: Not on file    Physically abused: Not on file    Forced sexual activity: Not on file  Other  Topics Concern  . Not on file  Social History Narrative  . Not on file    Family History  Problem Relation Age of Onset  . Lung cancer Mother   . Uterine cancer Mother   . Lung cancer Father   . Lung cancer Brother   . Atrial fibrillation Sister     ROS: Fatigue but no fevers or chills, productive cough, hemoptysis, dysphasia, odynophagia, melena, hematochezia, dysuria, hematuria, rash, seizure activity, orthopnea, PND, pedal edema, claudication. Remaining systems are negative.  Physical Exam: Well-developed well-nourished in no acute distress.  Skin is warm and dry.  HEENT is normal.  Neck is supple.  Chest is clear to auscultation with normal expansion. ICD left chest  Cardiovascular exam is regular rate and rhythm.  Abdominal exam nontender or distended. No masses palpated. Extremities show no edema. neuro grossly intact  A/P  1 paroxysmal atrial fibrillation-patient remains in sinus rhythm.  We will continue with metoprolol for rate control if atrial fibrillation recurs.  Continue apixaban.  Check hemoglobin and renal function.  2 history of nonischemic cardiomyopathy-continue ARB, beta-blocker and Spironolactone.  Check potassium and renal function.  Repeat echocardiogram.  3 prior ICD-followed by electrophysiology.  4 hypertension-blood pressure is elevated; increase amlodipine to 10 mg daily and follow.  Kirk Ruths, MD

## 2018-01-08 NOTE — Patient Instructions (Signed)
Medication Instructions:   INCREASE AMLODIPINE TO 10 MG ONCE DAILY= 2 OF THE 5 MG TABLETS ONCE DAILY  Labwork:  Your physician recommends that you HAVE LAB WORK TODAY  Testing/Procedures:  Your physician has requested that you have an echocardiogram. Echocardiography is a painless test that uses sound waves to create images of your heart. It provides your doctor with information about the size and shape of your heart and how well your heart's chambers and valves are working. This procedure takes approximately one hour. There are no restrictions for this procedure.    Follow-Up:  Your physician wants you to follow-up in: [place] will receive a reminder letter in the mail two months in advance. If you don't receive a letter, please call our office to schedule the follow-up appointment.    If you need a refill on your cardiac medications before your next appointment, please call your pharmacy.

## 2018-01-09 LAB — BASIC METABOLIC PANEL
BUN/Creatinine Ratio: 19 (ref 10–24)
BUN: 19 mg/dL (ref 8–27)
CALCIUM: 10.4 mg/dL — AB (ref 8.6–10.2)
CO2: 22 mmol/L (ref 20–29)
Chloride: 100 mmol/L (ref 96–106)
Creatinine, Ser: 0.99 mg/dL (ref 0.76–1.27)
GFR calc Af Amer: 89 mL/min/{1.73_m2} (ref >59)
GFR calc non Af Amer: 77 mL/min/{1.73_m2} (ref >59)
GLUCOSE: 148 mg/dL — AB (ref 65–99)
POTASSIUM: 4.5 mmol/L (ref 3.5–5.2)
SODIUM: 137 mmol/L (ref 134–144)

## 2018-01-09 LAB — CBC
HEMATOCRIT: 40.3 % (ref 37.5–51.0)
HEMOGLOBIN: 14.2 g/dL (ref 13.0–17.7)
MCH: 31.3 pg (ref 26.6–33.0)
MCHC: 35.2 g/dL (ref 31.5–35.7)
MCV: 89 fL (ref 79–97)
Platelets: 260 10*3/uL (ref 150–379)
RBC: 4.54 x10E6/uL (ref 4.14–5.80)
RDW: 13.6 % (ref 12.3–15.4)
WBC: 11.7 10*3/uL — AB (ref 3.4–10.8)

## 2018-01-10 ENCOUNTER — Other Ambulatory Visit: Payer: Self-pay | Admitting: Cardiology

## 2018-01-10 ENCOUNTER — Ambulatory Visit (HOSPITAL_COMMUNITY): Payer: 59 | Attending: Cardiology

## 2018-01-10 ENCOUNTER — Other Ambulatory Visit: Payer: Self-pay

## 2018-01-10 DIAGNOSIS — I428 Other cardiomyopathies: Secondary | ICD-10-CM | POA: Insufficient documentation

## 2018-01-10 DIAGNOSIS — I4891 Unspecified atrial fibrillation: Secondary | ICD-10-CM | POA: Insufficient documentation

## 2018-01-10 DIAGNOSIS — I429 Cardiomyopathy, unspecified: Secondary | ICD-10-CM | POA: Insufficient documentation

## 2018-01-10 NOTE — Telephone Encounter (Signed)
REFILL 

## 2018-01-12 ENCOUNTER — Encounter: Payer: Self-pay | Admitting: Family Medicine

## 2018-01-19 NOTE — Progress Notes (Addendum)
[place] [facility] [address], [place], [place] [address] [phone]  Date:  01/21/2018   Name:  Bobby Bradley   DOB:  [birth year]   MRN:  [id number]  PCP:  Darreld Mclean, MD    Chief Complaint: Back Pain (go over back xray results) and Hyperglycemia (when seeing Dr. Ruthine Dose labs, elevated glucose level)   History of Present Illness:  Bobby Bradley is a 72 y.o. very pleasant male patient who presents with the following:  History of cardiomyopathy, ICD, afib and chronic anticoagulation, HTN Here today to go over recent films   Lumbar spine films from 4/29  Dg Lumbar Spine Complete  Result Date: 01/08/2018 CLINICAL DATA:  72 year old male with low back pain and right radiculopathy for the past week. No injury. Initial encounter. EXAM: LUMBAR SPINE - COMPLETE 4+ VIEW COMPARISON:  None. FINDINGS: Straightening of the lumbar spine. Moderate-to-marked L4-5 and L5-S1 disc space narrowing with bilateral facet degenerative changes. Mild L1-2 and L3-4 disc space narrowing. No compression fracture. Five gallstones. Possible 8 mm left renal calculus. Vascular calcifications. IMPRESSION: Moderate-to-marked L4-5 and L5-S1 disc space narrowing with bilateral facet degenerative changes. Mild L1-2 and L3-4 disc space narrowing. Five gallstones. Possible 8 mm left renal calculus. Aortic Atherosclerosis (ICD10-I70.0). Electronically Signed   By: Genia Del M.D.   On: 01/08/2018 06:37   From our last visit in late April of this year:  For the last year he has noted some issues with his right leg off an on- pain from the buttock down the lateral calf. He suspected sciatica. He also more recently started to notice some right sided lower back pain He notes that it is harder for him to lift his right leg out of a car to get out of a passenger side. He recently went on quite a long car trip. He felt ok the day of the drive and the day  after, but 2 days later he had pain running down his right leg and was quite stiff.  He is sore and stiff in the am- has to take a hot shower and some tylenol to get going  He did have an operation at L5/S1 in the 1995.  This does not really remind him of that time.  He had a repeat operation a year later "because I didn't follow doctor's orders."  He feels a bit numb down the right lateral calf  No bowel or bladder control issues that are new- he did have surgery for prostate cancer so he has some baseline urinary continence problems He has tried tylenol and heat wraps so far  Back surgery was done in [place] - he does not have a back specialist here   He also was recently in Dr. Jacalyn Lefevre office and was noted to have a glucose of 148, and an elevated calcium as well - however he was on prednisone at that time   We treated him with prednisone at his recent visit with me- he has completed this course now.  He notes that this really helped him, after 1 dose he felt 75% better .  At this time his sx are really resolved and he does not wish to have a referral  He has some flexeril left which he can use prn if sx return  His strength in his leg is good  No new urinary sx  No numbness  The pain down his legs has resolved He may still  feel a glitch in his right hip/ lower back on occasion   We discussed signs and sx of gallbladder colic and kidney stones- he has not noted any of these problems but will watch out for any post prandial pain, flank pain, hematuria, etc  Patient Active Problem List   Diagnosis Date Noted  . Cardiomyopathy ([place]) 04/16/2017  . History of TIA (transient ischemic attack) 04/16/2017  . Chronic anticoagulation 04/16/2017  . Paroxysmal atrial fibrillation ([place]) 10/11/2016  . Chronic systolic congestive heart failure ([place]) 10/11/2016  . ICD (implantable cardioverter-defibrillator) in place 10/11/2016  . History of prostate cancer 10/11/2016  . Essential hypertension  10/11/2016    Past Medical History:  Diagnosis Date  . A-fib ([place])   . CHF (congestive heart failure) ([place])   . Essential hypertension   . Hyperlipidemia   . Non-ischemic cardiomyopathy ([place])   . Presence of combination internal cardiac defibrillator (ICD) and pacemaker   . Prostate cancer ([place])   . TIA (transient ischemic attack)     Past Surgical History:  Procedure Laterality Date  . BACK SURGERY    . PROSTATECTOMY      Social History   Tobacco Use  . Smoking status: Former Smoker    Last attempt to quit: 09/12/1983    Years since quitting: 34.3  . Smokeless tobacco: Never Used  Substance Use Topics  . Alcohol use: Yes    Comment: very rare  . Drug use: No    Family History  Problem Relation Age of Onset  . Lung cancer Mother   . Uterine cancer Mother   . Lung cancer Father   . Lung cancer Brother   . Atrial fibrillation Sister     Allergies  Allergen Reactions  . Lisinopril     Extreme diarrhea    Medication list has been reviewed and updated.  Current Outpatient Medications on File Prior to Visit  Medication Sig Dispense Refill  . amLODipine (NORVASC) 10 MG tablet Take 1 tablet (10 mg total) by mouth daily. 90 tablet 3  . carvedilol (COREG) 25 MG tablet TAKE 1 TABLET(25 MG) BY MOUTH TWICE DAILY 60 tablet 11  . cyclobenzaprine (FLEXERIL) 10 MG tablet Take 1 tablet (10 mg total) by mouth 2 (two) times daily as needed for muscle spasms. 30 tablet 0  . ELIQUIS 5 MG TABS tablet TAKE 1 TABLET(5 MG) BY MOUTH TWICE DAILY 180 tablet 1  . fluticasone (FLONASE) 50 MCG/ACT nasal spray Place 2 sprays into both nostrils daily. 16 g 6  . losartan (COZAAR) 100 MG tablet TAKE 1 TABLET(100 MG) BY MOUTH DAILY 60 tablet 11  . Multiple Vitamin (MULTI VITAMIN DAILY PO) Take 1 tablet by mouth daily.    Marland Kitchen spironolactone (ALDACTONE) 25 MG tablet TAKE 1 TABLET(25 MG) BY MOUTH DAILY 90 tablet 3   No current facility-administered medications on file prior to visit.     Review  of Systems:  As per HPI- otherwise negative. No fever or chills No rash     Physical Examination: Vitals:   01/21/18 0934  BP: 128/64  Pulse: 71  Resp: 16  SpO2: 97%   Vitals:   01/21/18 0934  Weight: 247 lb (112 kg)  Height: 6' (1.829 m)   Body mass index is 33.5 kg/m. Ideal Body Weight: Weight in (lb) to have BMI = 25: 183.9  GEN: WDWN, NAD, Non-toxic, A & O x 3, overweight, looks well  HEENT: Atraumatic, Normocephalic. Neck supple. No masses, No LAD.   Ears and  Nose: No external deformity. CV: RRR, No M/G/R. No JVD. No thrill. No extra heart sounds. PULM: CTA B, no wheezes, crackles, rhonchi. No retractions. No resp. distress. No accessory muscle use. EXTR: No c/c/e NEURO Normal gait.  PSYCH: Normally interactive. Conversant. Not depressed or anxious appearing.  Calm demeanor.  Normal strength and sensation and DTR of both LE, neg SLR    Assessment and Plan: Sciatica, right side  Elevated serum glucose - Plan: Hemoglobin A1c, CANCELED: Hemoglobin A1c  Serum calcium elevated - Plan: Calcium  Gallstones  Sciatica sx are resolved after pred taper,  He will let me know if these return Follow- up on elevated glucose and calcium today Discussed gallstones and kidney stone noted on x-rays today   Signed Lamar Blinks, MD  Received his labs- message to pt  Calcium is back to normal.  However your A1c- average blood sugar over the previous 3 months- is in diabetic range (greater than 6.5%).  We would need 2 A1c levels in this range to confirm diagnosis of diabetes, but this is suspicious.  Certainly your Z6W is not wildly elevated but I supsect that you do have mild type 2 diabetes.   For the time being, please work on your diet; fewer sweets and carbs, more vegetables, lean protein and low fat dairy.   Exercise is also a good idea.  Let's repeat an A1c in about 2 weeks, which can be done as a lab visit only.  If your A1c is still elevated at that time we will start  an oral medication (likely metformin) Let me know if any questions, and I will order your A1c for you to have drawn as a lab visit only Results for orders placed or performed in visit on 01/21/18  Calcium  Result Value Ref Range   Calcium 9.2 8.4 - 10.5 mg/dL  Hemoglobin A1c  Result Value Ref Range   Hgb A1c MFr Bld 7.2 (H) 4.6 - 6.5 %

## 2018-01-21 ENCOUNTER — Encounter: Payer: Self-pay | Admitting: Family Medicine

## 2018-01-21 ENCOUNTER — Ambulatory Visit: Payer: 59 | Admitting: Family Medicine

## 2018-01-21 VITALS — BP 128/64 | HR 71 | Resp 16 | Ht 72.0 in | Wt 247.0 lb

## 2018-01-21 DIAGNOSIS — R739 Hyperglycemia, unspecified: Secondary | ICD-10-CM

## 2018-01-21 DIAGNOSIS — K8020 Calculus of gallbladder without cholecystitis without obstruction: Secondary | ICD-10-CM

## 2018-01-21 DIAGNOSIS — M5431 Sciatica, right side: Secondary | ICD-10-CM | POA: Diagnosis not present

## 2018-01-21 LAB — CALCIUM: CALCIUM: 9.2 mg/dL (ref 8.4–10.5)

## 2018-01-21 LAB — HEMOGLOBIN A1C: Hgb A1c MFr Bld: 7.2 % — ABNORMAL HIGH (ref 4.6–6.5)

## 2018-01-21 NOTE — Patient Instructions (Addendum)
So glad that the prednisone helped you and that your back is better Let me know if it starts to bother you a lot in the future  We will follow-up on elevated calcium and blood sugar today As we discussed, watch for any symptoms of kidney stones or gallbladder pain- please alert me or otherwise seek care if these occur Take care!

## 2018-01-22 ENCOUNTER — Encounter: Payer: Self-pay | Admitting: Family Medicine

## 2018-01-22 NOTE — Addendum Note (Signed)
Addended by: Lamar Blinks C on: 01/22/2018 06:13 AM   Modules accepted: Orders

## 2018-02-06 ENCOUNTER — Encounter: Payer: Self-pay | Admitting: Family Medicine

## 2018-02-06 ENCOUNTER — Other Ambulatory Visit (INDEPENDENT_AMBULATORY_CARE_PROVIDER_SITE_OTHER): Payer: 59

## 2018-02-06 ENCOUNTER — Other Ambulatory Visit: Payer: Self-pay | Admitting: Family Medicine

## 2018-02-06 DIAGNOSIS — R739 Hyperglycemia, unspecified: Secondary | ICD-10-CM | POA: Diagnosis not present

## 2018-02-06 DIAGNOSIS — E119 Type 2 diabetes mellitus without complications: Secondary | ICD-10-CM | POA: Insufficient documentation

## 2018-02-06 LAB — HEMOGLOBIN A1C: Hgb A1c MFr Bld: 7.1 % — ABNORMAL HIGH (ref 4.6–6.5)

## 2018-02-06 MED ORDER — METFORMIN HCL 500 MG PO TABS: 500.0000 mg | ORAL_TABLET | Freq: Two times a day (BID) | ORAL | 3 refills | Status: DC

## 2018-03-11 ENCOUNTER — Ambulatory Visit (INDEPENDENT_AMBULATORY_CARE_PROVIDER_SITE_OTHER): Payer: 59 | Admitting: *Deleted

## 2018-03-11 DIAGNOSIS — I428 Other cardiomyopathies: Secondary | ICD-10-CM

## 2018-03-11 NOTE — Progress Notes (Signed)
Remote ICD transmission.   

## 2018-04-09 LAB — CUP PACEART REMOTE DEVICE CHECK
Battery Remaining Longevity: 144 mo
Battery Remaining Percentage: 100 %
Brady Statistic RV Percent Paced: 1 %
HIGH POWER IMPEDANCE MEASURED VALUE: 53 Ohm
Implantable Lead Location: 753860
Implantable Pulse Generator Implant Date: 20160308
Lead Channel Impedance Value: 404 Ohm
Lead Channel Setting Pacing Amplitude: 2.4 V
Lead Channel Setting Pacing Pulse Width: 0.5 ms
Lead Channel Setting Sensing Sensitivity: 0.6 mV
MDC IDC LEAD IMPLANT DT: 20160308
MDC IDC LEAD SERIAL: 363165
MDC IDC MSMT LEADCHNL RV PACING THRESHOLD AMPLITUDE: 0.8 V
MDC IDC MSMT LEADCHNL RV PACING THRESHOLD PULSEWIDTH: 0.5 ms
MDC IDC SESS DTM: 20190701123500
Pulse Gen Serial Number: 200269

## 2018-05-15 ENCOUNTER — Ambulatory Visit (INDEPENDENT_AMBULATORY_CARE_PROVIDER_SITE_OTHER): Payer: Medicare Other | Admitting: Cardiology

## 2018-05-15 ENCOUNTER — Encounter: Payer: Self-pay | Admitting: Cardiology

## 2018-05-15 VITALS — BP 130/62 | HR 63 | Ht 72.0 in | Wt 247.0 lb

## 2018-05-15 DIAGNOSIS — I10 Essential (primary) hypertension: Secondary | ICD-10-CM

## 2018-05-15 DIAGNOSIS — I429 Cardiomyopathy, unspecified: Secondary | ICD-10-CM | POA: Diagnosis not present

## 2018-05-15 DIAGNOSIS — I480 Paroxysmal atrial fibrillation: Secondary | ICD-10-CM

## 2018-05-15 DIAGNOSIS — I428 Other cardiomyopathies: Secondary | ICD-10-CM

## 2018-05-15 LAB — CUP PACEART INCLINIC DEVICE CHECK
Date Time Interrogation Session: 20190904040000
HIGH POWER IMPEDANCE MEASURED VALUE: 55 Ohm
Implantable Lead Location: 753860
Implantable Lead Serial Number: 363165
Lead Channel Impedance Value: 404 Ohm
Lead Channel Setting Pacing Amplitude: 2.4 V
Lead Channel Setting Pacing Pulse Width: 0.5 ms
Lead Channel Setting Sensing Sensitivity: 0.6 mV
MDC IDC LEAD IMPLANT DT: 20160308
MDC IDC MSMT LEADCHNL RV PACING THRESHOLD AMPLITUDE: 0.9 V
MDC IDC MSMT LEADCHNL RV PACING THRESHOLD PULSEWIDTH: 0.5 ms
MDC IDC MSMT LEADCHNL RV SENSING INTR AMPL: 22.6 mV
MDC IDC PG IMPLANT DT: 20160308
Pulse Gen Serial Number: 200269

## 2018-05-15 NOTE — Progress Notes (Signed)
Electrophysiology Office Note   Date:  05/15/2018   ID:  Bobby Bradley, DOB Jun 24, 1946, MRN [id number]  PCP:  Darreld Mclean, MD  Cardiologist:  Stanford Breed Primary Electrophysiologist:  Will Meredith Leeds, MD    No chief complaint on file.    History of Present Illness: Bobby Bradley is a 72 y.o. male who is being seen today for the evaluation of CHF at the request of Kirk Ruths. Presenting today for electrophysiology evaluation.  He has a history of atrial fibrillation, nonischemic cardiomyopathy, hypertension, hyperlipidemia, and TIAs.  He has an ICD in place.  Today, he denies symptoms of palpitations, chest pain, shortness of breath, orthopnea, PND, lower extremity edema, claudication, dizziness, presyncope, syncope, bleeding, or neurologic sequela. The patient is tolerating medications without difficulties.  Is overall feeling well without much in the way of shortness of breath.   Past Medical History:  Diagnosis Date  . A-fib ([place])   . CHF (congestive heart failure) ([place])   . Essential hypertension   . Hyperlipidemia   . Non-ischemic cardiomyopathy ([place])   . Presence of combination internal cardiac defibrillator (ICD) and pacemaker   . Prostate cancer ([place])   . TIA (transient ischemic attack)    Past Surgical History:  Procedure Laterality Date  . BACK SURGERY    . PROSTATECTOMY       Current Outpatient Medications  Medication Sig Dispense Refill  . amLODipine (NORVASC) 10 MG tablet Take 1 tablet (10 mg total) by mouth daily. 90 tablet 3  . carvedilol (COREG) 25 MG tablet TAKE 1 TABLET(25 MG) BY MOUTH TWICE DAILY 60 tablet 11  . cyclobenzaprine (FLEXERIL) 10 MG tablet Take 1 tablet (10 mg total) by mouth 2 (two) times daily as needed for muscle spasms. 30 tablet 0  . ELIQUIS 5 MG TABS tablet TAKE 1 TABLET(5 MG) BY MOUTH TWICE DAILY 180 tablet 1  . fluticasone (FLONASE) 50 MCG/ACT nasal spray Place 2 sprays into both nostrils daily. 16 g 6  . losartan  (COZAAR) 100 MG tablet TAKE 1 TABLET(100 MG) BY MOUTH DAILY 60 tablet 11  . metFORMIN (GLUCOPHAGE) 500 MG tablet Take 1 tablet (500 mg total) by mouth 2 (two) times daily with a meal. 180 tablet 3  . Multiple Vitamin (MULTI VITAMIN DAILY PO) Take 1 tablet by mouth daily.    Marland Kitchen spironolactone (ALDACTONE) 25 MG tablet TAKE 1 TABLET(25 MG) BY MOUTH DAILY 90 tablet 3   No current facility-administered medications for this visit.     Allergies:   Lisinopril   Social History:  The patient  reports that he quit smoking about 34 years ago. He has never used smokeless tobacco. He reports that he drinks alcohol. He reports that he does not use drugs.   Family History:  The patient's family history includes Atrial fibrillation in his sister; Lung cancer in his brother, father, and mother; Uterine cancer in his mother.    ROS:  Please see the history of present illness.   Otherwise, review of systems is positive for none.   All other systems are reviewed and negative.    PHYSICAL EXAM: VS:  BP 130/62   Pulse 63   Ht 6' (1.829 m)   Wt 247 lb (112 kg)   BMI 33.50 kg/m  , BMI Body mass index is 33.5 kg/m. GEN: Well nourished, well developed, in no acute distress  HEENT: normal  Neck: no JVD, carotid bruits, or masses Cardiac: RRR; no murmurs, rubs, or gallops,no edema  Respiratory:  clear to auscultation bilaterally, normal work of breathing GI: soft, nontender, nondistended, + BS MS: no deformity or atrophy  Skin: warm and dry, device pocket is well healed Neuro:  Strength and sensation are intact Psych: euthymic mood, full affect  EKG:  EKG is ordered today. Personal review of the ekg ordered shows anus rhythm, first-degree AV block, rate 63  Device interrogation is reviewed today in detail.  See PaceArt for details.   Recent Labs: 01/08/2018: BUN 19; Creatinine, Ser 0.99; Hemoglobin 14.2; Platelets 260; Potassium 4.5; Sodium 137    Lipid Panel  No results found for: CHOL, TRIG, HDL,  CHOLHDL, VLDL, LDLCALC, LDLDIRECT   Wt Readings from Last 3 Encounters:  05/15/18 247 lb (112 kg)  01/21/18 247 lb (112 kg)  01/08/18 248 lb 9.6 oz (112.8 kg)      Other studies Reviewed: Additional studies/ records that were reviewed today include: TTE 01/10/18  Review of the above records today demonstrates:  - Left ventricle: The cavity size was normal. Wall thickness was   increased in a pattern of moderate LVH. Systolic function was   mildly reduced. The estimated ejection fraction was in the range   of 45% to 50%. Diffuse hypokinesis. Doppler parameters are   consistent with abnormal left ventricular relaxation (grade 1   diastolic dysfunction). - Mitral valve: Calcified annulus.   ASSESSMENT AND PLAN:  1.  Paroxysmal atrial fibrillation: Currently on Eliquis.  In sinus rhythm.  This patients CHA2DS2-VASc Score and unadjusted Ischemic Stroke Rate (% per year) is equal to 3.2 % stroke rate/year from a score of 3  Above score calculated as 1 point each if present [CHF, HTN, DM, Vascular=MI/PAD/Aortic Plaque, Age if 65-74, or Male] Above score calculated as 2 points each if present [Age > 75, or Stroke/TIA/TE]  2.  Nonischemic cardiomyopathy: Currently on ARB, beta-blocker, Aldactone.  Has a [employer] ICD in place.  Device functioning appropriately.  No changes.  3.  Hypertension: Controlled today.  No changes.    Current medicines are reviewed at length with the patient today.   The patient does not have concerns regarding his medicines.  The following changes were made today:  none  Labs/ tests ordered today include:  Orders Placed This Encounter  Procedures  . EKG 12-Lead     Disposition:   FU with Will Camnitz 1 year  Signed, Will Meredith Leeds, MD  05/15/2018 2:23 PM     [place] [address] [phone] (office) [phone] (fax)

## 2018-05-15 NOTE — Patient Instructions (Signed)
Medication Instructions:  Your physician recommends that you continue on your current medications as directed. Please refer to the Current Medication list given to you today.  *If you need a refill on your cardiac medications before your next appointment, please call your pharmacy*  Labwork: None ordered  Testing/Procedures: None ordered  Follow-Up: Remote monitoring is used to monitor your Pacemaker or ICD from home. This monitoring reduces the number of office visits required to check your device to one time per year. It allows [place] to keep an eye on the functioning of your device to ensure it is working properly. You are scheduled for a device check from home on 06/10/2018. You may send your transmission at any time that day. If you have a wireless device, the transmission will be sent automatically. After your physician reviews your transmission, you will receive a postcard with your next transmission date.  Your physician wants you to follow-up in: 1 year with Dr. Curt Bears.  You will receive a reminder letter in the mail two months in advance. If you don't receive a letter, please call our office to schedule the follow-up appointment.  Thank you for choosing [facility]!!   Trinidad Curet, RN [phone]

## 2018-06-03 ENCOUNTER — Other Ambulatory Visit: Payer: Self-pay | Admitting: Cardiology

## 2018-06-10 ENCOUNTER — Ambulatory Visit (INDEPENDENT_AMBULATORY_CARE_PROVIDER_SITE_OTHER): Payer: Medicare Other | Admitting: *Deleted

## 2018-06-10 DIAGNOSIS — I428 Other cardiomyopathies: Secondary | ICD-10-CM

## 2018-06-11 NOTE — Progress Notes (Signed)
Remote ICD transmission.   

## 2018-07-04 LAB — CUP PACEART REMOTE DEVICE CHECK
Battery Remaining Longevity: 144 mo
Date Time Interrogation Session: 20191024150956
Implantable Lead Implant Date: 20160308
Implantable Pulse Generator Implant Date: 20160308
Lead Channel Sensing Intrinsic Amplitude: 20.1 mV
Lead Channel Setting Pacing Pulse Width: 0.5 ms
Lead Channel Setting Sensing Sensitivity: 0.6 mV
MDC IDC LEAD LOCATION: 753860
MDC IDC LEAD SERIAL: 363165
MDC IDC SET LEADCHNL RV PACING AMPLITUDE: 2.4 V
Pulse Gen Serial Number: 200269

## 2018-07-13 ENCOUNTER — Telehealth: Payer: Self-pay | Admitting: Physician Assistant

## 2018-07-13 MED ORDER — LOSARTAN POTASSIUM 100 MG PO TABS: 100.0000 mg | ORAL_TABLET | Freq: Every day | ORAL | 1 refills | Status: DC

## 2018-07-13 MED ORDER — APIXABAN 5 MG PO TABS: 5.0000 mg | ORAL_TABLET | Freq: Two times a day (BID) | ORAL | 1 refills | Status: DC

## 2018-07-13 NOTE — Telephone Encounter (Signed)
The patient called the answering service after-hours today. He spoke with [facility] on [address] and they did not have rx's for Eliquis and Losartan on file. Last seen by Dr. Curt Bears 05/2018, and also up to date on follow-up with Dr. Stanford Breed in 12/2017. Labs stable this year. I sent in rx's as requested for 6 months at prior doses. The patient verbalized understanding and gratitude.  Dayna Dunn PA-C

## 2018-08-17 ENCOUNTER — Ambulatory Visit (INDEPENDENT_AMBULATORY_CARE_PROVIDER_SITE_OTHER): Payer: Medicare Other | Admitting: Family Medicine

## 2018-08-17 ENCOUNTER — Encounter: Payer: Self-pay | Admitting: Family Medicine

## 2018-08-17 DIAGNOSIS — J40 Bronchitis, not specified as acute or chronic: Secondary | ICD-10-CM

## 2018-08-17 MED ORDER — ALBUTEROL SULFATE HFA 108 (90 BASE) MCG/ACT IN AERS: 2.0000 | INHALATION_SPRAY | Freq: Four times a day (QID) | RESPIRATORY_TRACT | 0 refills | Status: DC | PRN

## 2018-08-17 MED ORDER — AZITHROMYCIN 250 MG PO TABS: ORAL_TABLET | ORAL | 0 refills | Status: DC

## 2018-08-17 NOTE — Patient Instructions (Signed)

## 2018-08-17 NOTE — Assessment & Plan Note (Signed)
History and symptoms consistent with acute bronchitis -Start azithromycin for prolonged symptoms -Rx for albuterol -Increase fluids  -humidifier recommended -F/u if not improving or for worsening symptoms.

## 2018-08-17 NOTE — Progress Notes (Signed)
Bobby Bradley - 72 y.o. male MRN [id number]  Date of birth: [birth year]  Subjective Chief Complaint  Patient presents with  . Cough    Ongoing for two weeks-he recently traveled from [place]. Denies wheezing. Admits to productive cough. Denies fevers and body aches. He has been taking Nyquil.     HPI Bobby Bradley is a 72 y.o. male  Here today with complaint of cough, congestion, shortness of breath, and intermittent wheezing. Symptoms started around 10-12 days ago and are not improving.  He denies fever, chills, increased fatigue, body aches, nausea, vomiting, diarrhea. He has been using OTC medications with some improvement.   ROS:  A comprehensive ROS was completed and negative except as noted per HPI  Allergies  Allergen Reactions  . Lisinopril     Extreme diarrhea    Past Medical History:  Diagnosis Date  . A-fib ([place])   . CHF (congestive heart failure) ([place])   . Essential hypertension   . Hyperlipidemia   . Non-ischemic cardiomyopathy ([place])   . Presence of combination internal cardiac defibrillator (ICD) and pacemaker   . Prostate cancer ([place])   . TIA (transient ischemic attack)     Past Surgical History:  Procedure Laterality Date  . BACK SURGERY    . PROSTATECTOMY      Social History   Socioeconomic History  . Marital status: Married    Spouse name: Not on file  . Number of children: 7  . Years of education: Not on file  . Highest education level: Not on file  Occupational History  . Occupation: Art therapist   Social Needs  . Financial resource strain: Not on file  . Food insecurity:    Worry: Not on file    Inability: Not on file  . Transportation needs:    Medical: Not on file    Non-medical: Not on file  Tobacco Use  . Smoking status: Former Smoker    Last attempt to quit: 09/12/1983    Years since quitting: 34.9  . Smokeless tobacco: Never Used  Substance and Sexual Activity  . Alcohol use: Yes    Comment: very rare  . Drug use:  No  . Sexual activity: Not on file  Lifestyle  . Physical activity:    Days per week: Not on file    Minutes per session: Not on file  . Stress: Not on file  Relationships  . Social connections:    Talks on phone: Not on file    Gets together: Not on file    Attends religious service: Not on file    Active member of club or organization: Not on file    Attends meetings of clubs or organizations: Not on file    Relationship status: Not on file  Other Topics Concern  . Not on file  Social History Narrative  . Not on file    Family History  Problem Relation Age of Onset  . Lung cancer Mother   . Uterine cancer Mother   . Lung cancer Father   . Lung cancer Brother   . Atrial fibrillation Sister     Health Maintenance  Topic Date Due  . Hepatitis C Screening  June 08, 1946  . FOOT EXAM  06/26/1957  . OPHTHALMOLOGY EXAM  06/26/1957  . TETANUS/TDAP  06/26/1966  . COLONOSCOPY  06/26/1997  . PNA vac Low Risk Adult (2 of 2 - PCV13) 10/28/2015  . INFLUENZA VACCINE  04/11/2018  . HEMOGLOBIN A1C  08/09/2018    -----------------------------------------------------------------------------------------------------------------------------------------------------------------------------------------------------------------  Physical Exam BP 134/68   Pulse 63   Temp 98.7 F (37.1 C) (Oral)   Ht 6' (1.829 m)   Wt 248 lb (112.5 kg)   SpO2 95%   BMI 33.63 kg/m   Physical Exam  Constitutional: He is oriented to person, place, and time. He appears well-nourished. No distress.  HENT:  Head: Normocephalic and atraumatic.  Mouth/Throat: Oropharynx is clear and moist.  Eyes: No scleral icterus.  Neck: Neck supple.  Cardiovascular: Normal rate, regular rhythm and normal heart sounds.  Pulmonary/Chest: Effort normal. He has wheezes (mild bilateraly wheezing).  Lymphadenopathy:    He has no cervical adenopathy.  Neurological: He is alert and oriented to person, place, and time.  Skin:  Skin is warm and dry.  Psychiatric: He has a normal mood and affect. His behavior is normal.    ------------------------------------------------------------------------------------------------------------------------------------------------------------------------------------------------------------------- Assessment and Plan  Bronchitis History and symptoms consistent with acute bronchitis -Start azithromycin for prolonged symptoms -Rx for albuterol -Increase fluids  -humidifier recommended -F/u if not improving or for worsening symptoms.

## 2018-09-09 ENCOUNTER — Ambulatory Visit (INDEPENDENT_AMBULATORY_CARE_PROVIDER_SITE_OTHER): Payer: Medicare Other

## 2018-09-09 DIAGNOSIS — I428 Other cardiomyopathies: Secondary | ICD-10-CM | POA: Diagnosis not present

## 2018-09-09 DIAGNOSIS — I5022 Chronic systolic (congestive) heart failure: Secondary | ICD-10-CM

## 2018-09-09 NOTE — Progress Notes (Signed)
Remote ICD transmission.   

## 2018-09-10 LAB — CUP PACEART REMOTE DEVICE CHECK
Date Time Interrogation Session: 20191231175839
Implantable Lead Implant Date: 20160308
Implantable Lead Model: 295
Implantable Pulse Generator Implant Date: 20160308
MDC IDC LEAD LOCATION: 753860
MDC IDC LEAD SERIAL: 363165
Pulse Gen Serial Number: 200269

## 2018-10-21 IMAGING — DX DG LUMBAR SPINE COMPLETE 4+V
5 series · 5 of 5 positions shown · non-contrast
Comparison: None.

CLINICAL DATA: 71-year-old male with low back pain and right
radiculopathy for the past week. No injury. Initial encounter.

EXAM:
LUMBAR SPINE - COMPLETE 4+ VIEW

[l-spine ap]
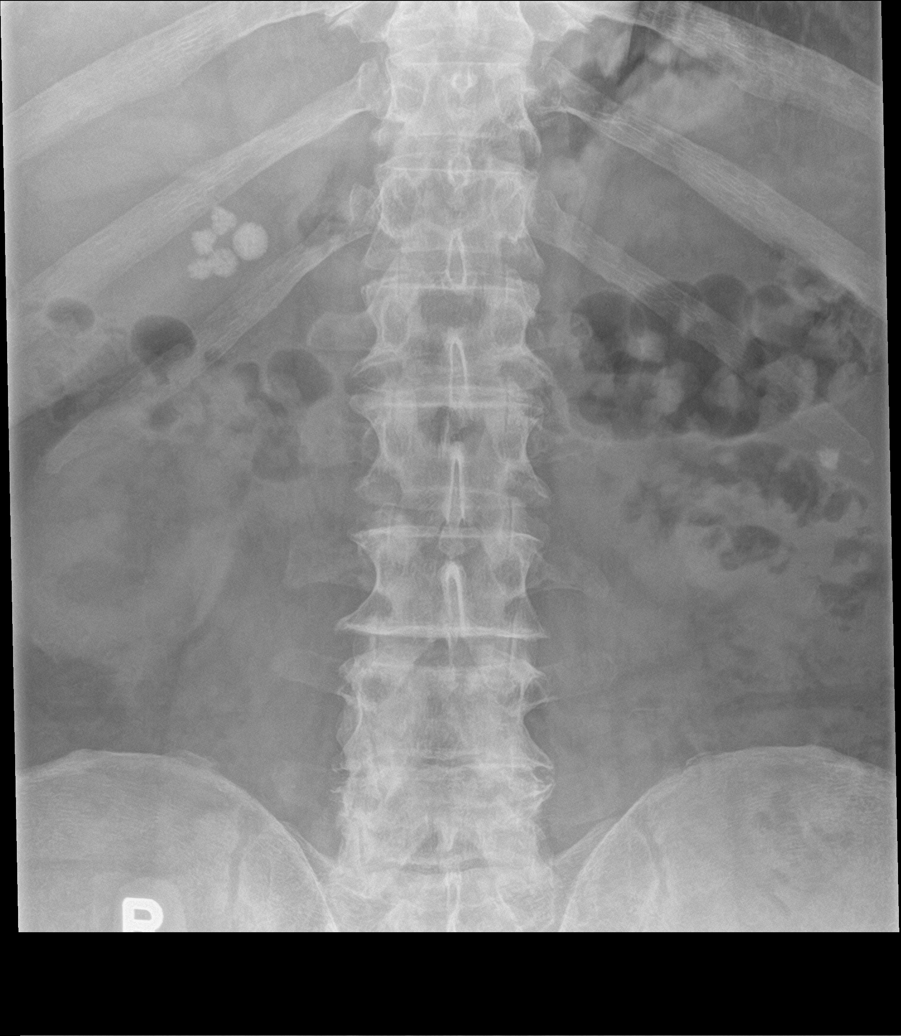

[l-spine obl (1 of 2)]
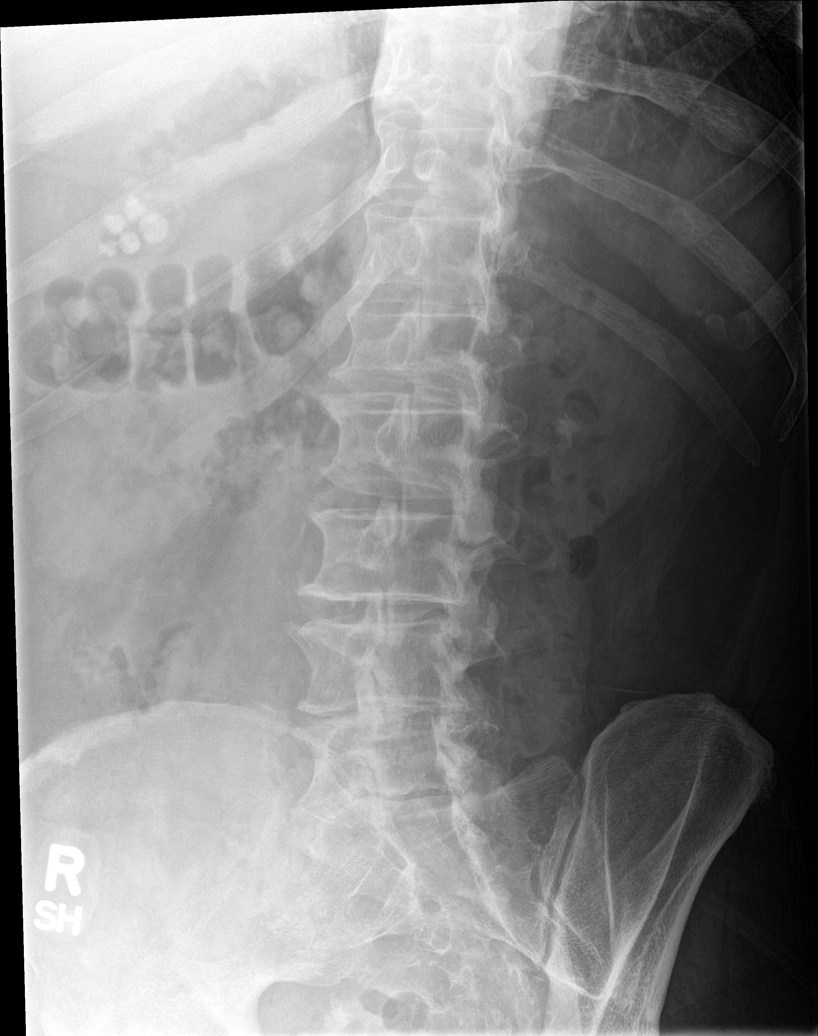

[l-spine obl (2 of 2)]
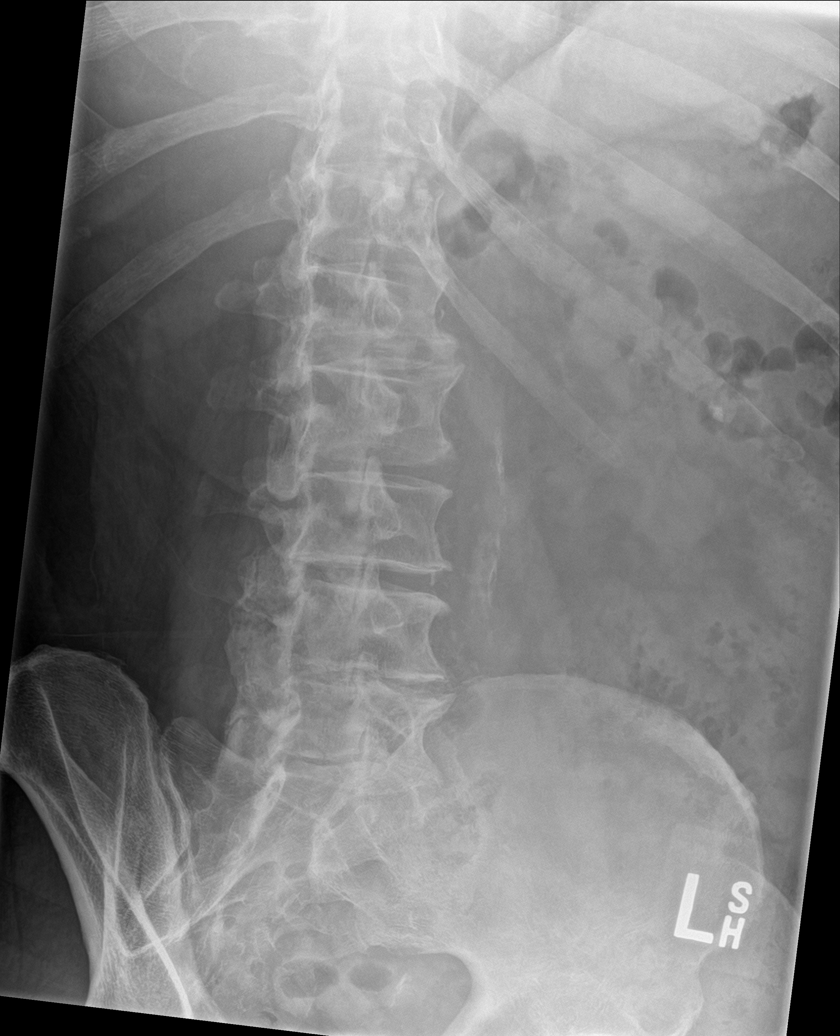

[l-spine lat]
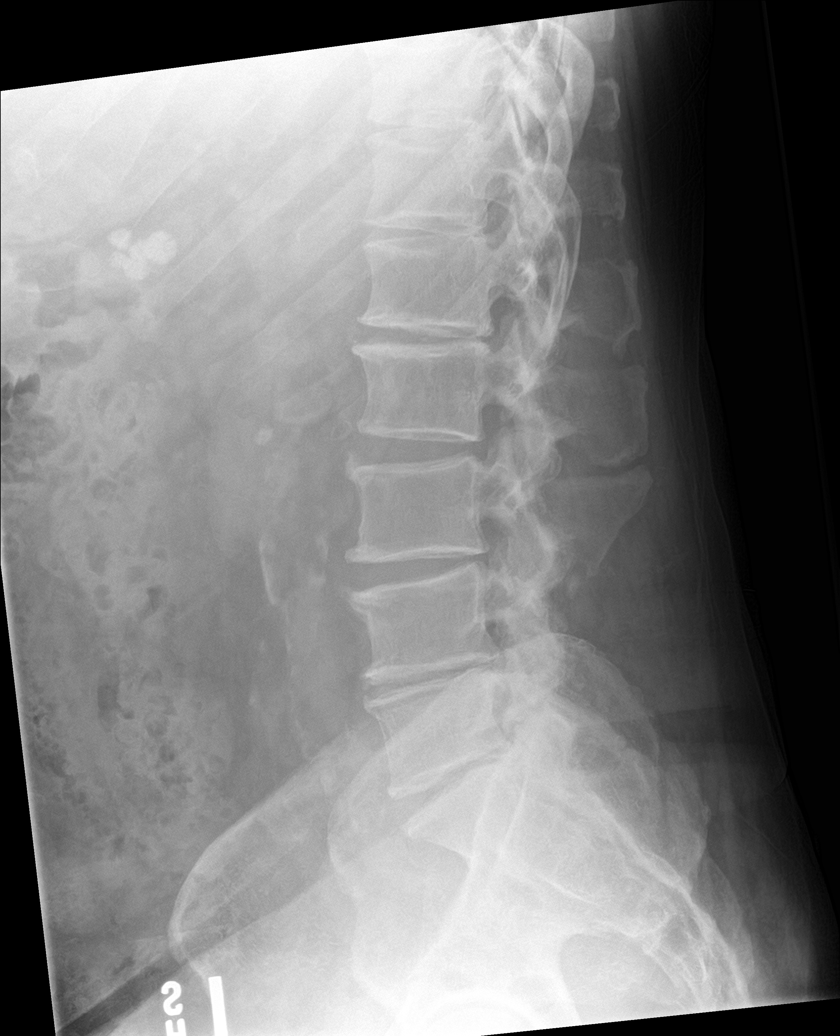

[l-spine spot]
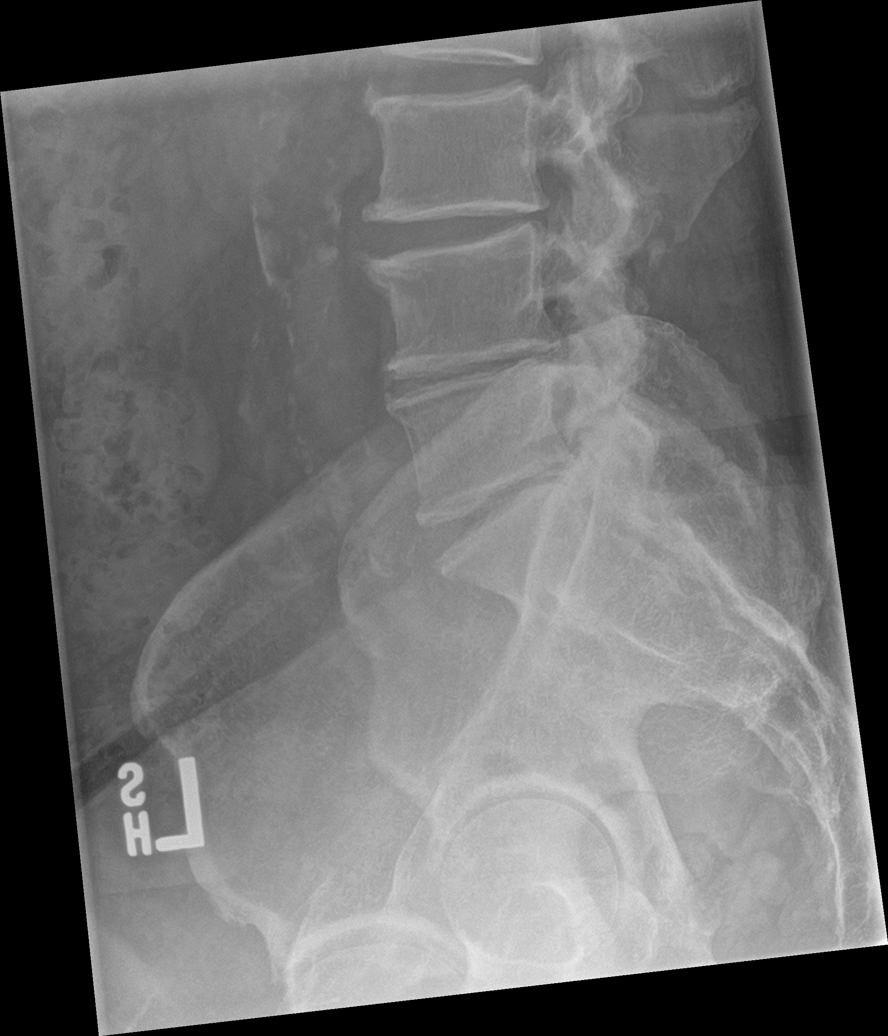

[5 of 5 positions shown; findings below may reference images not displayed]

FINDINGS: Straightening of the lumbar spine.

Moderate-to-marked L4-5 and L5-S1 disc space narrowing with
bilateral facet degenerative changes.

Mild L1-2 and L3-4 disc space narrowing.

No compression fracture.

Five gallstones. Possible 8 mm left renal calculus. Vascular
calcifications.
IMPRESSION: Moderate-to-marked L4-5 and L5-S1 disc space narrowing with
bilateral facet degenerative changes.

Mild L1-2 and L3-4 disc space narrowing.

Five gallstones.

Possible 8 mm left renal calculus.

Aortic Atherosclerosis ([id number]).

## 2018-12-04 ENCOUNTER — Ambulatory Visit: Payer: Self-pay | Admitting: *Deleted

## 2018-12-04 NOTE — Telephone Encounter (Signed)
2 weeks ago he had a "stomach flu". Symptoms resolved but now has the same symptoms.Temperature started today now 100.5 oral without medication. Combo of watery and soft stools started yesterday-has had 8  Small (about a handful only) stools over the last 24 hours. Denies blood and mucous in stool. Notices dull ache in both sides that resolves with stools. Drinking minimal of 8 glasses water daily. Voiding without difficulty and plenty. Denies dry mouth/nausea/voiting. Reviewed Care advice and symptoms requiring a call back. Stated understanding. Reason for Disposition . [1] SEVERE diarrhea (e.g., 7 or more times / day more than normal) AND [2] present > 24 hours (1 day)  Answer Assessment - Initial Assessment Questions 1. DIARRHEA SEVERITY: "How bad is the diarrhea?" "How many extra stools have you had in the past 24 hours than normal?"    - NO DIARRHEA (SCALE 0)   - MILD (SCALE 1-3): Few loose or mushy BMs; increase of 1-3 stools over normal daily number of stools; mild increase in ostomy output.   -  MODERATE (SCALE 4-7): Increase of 4-6 stools daily over normal; moderate increase in ostomy output. * SEVERE (SCALE 8-10; OR 'WORST POSSIBLE'): Increase of 7 or more stools daily over normal; moderate increase in ostomy output; incontinence.     8-10x  Mostly small amounts each time. 2. ONSET: "When did the diarrhea begin?"      Yesterday 3. BM CONSISTENCY: "How loose or watery is the diarrhea?"      Combination of particles and watery 4. VOMITING: "Are you also vomiting?" If so, ask: "How many times in the past 24 hours?"      no 5. ABDOMINAL PAIN: "Are you having any abdominal pain?" If yes: "What does it feel like?" (e.g., crampy, dull, intermittent, constant)      Yes, dull pains while lying on both sides.pain has been constant since it began, yesterday. Pain noticed only on his sides while lying on his sides. 6. ABDOMINAL PAIN SEVERITY: If present, ask: "How bad is the pain?"  (e.g., Scale  1-10; mild, moderate, or severe)   - MILD (1-3): doesn't interfere with normal activities, abdomen soft and not tender to touch    - MODERATE (4-7): interferes with normal activities or awakens from sleep, tender to touch    - SEVERE (8-10): excruciating pain, doubled over, unable to do any normal activities       4 7. ORAL INTAKE: If vomiting, "Have you been able to drink liquids?" "How much fluids have you had in the past 24 hours?"    No vomiting. Has drank water today about 12 ounces. Drank water last night 8. HYDRATION: "Any signs of dehydration?" (e.g., dry mouth [not just dry lips], too weak to stand, dizziness, new weight loss) "When did you last urinate?"     Voiding without difficulty today. 9. EXPOSURE: "Have you traveled to a foreign country recently?" "Have you been exposed to anyone with diarrhea?" "Could you have eaten any food that was spoiled?"     no 10. ANTIBIOTIC USE: "Are you taking antibiotics now or have you taken antibiotics in the past 2 months?"       no 11. OTHER SYMPTOMS: "Do you have any other symptoms?" (e.g., fever, blood in stool)       Fever 100.5 oral this morning. 12. PREGNANCY: "Is there any chance you are pregnant?" "When was your last menstrual period?"       na  Protocols used: [facility]

## 2018-12-05 ENCOUNTER — Ambulatory Visit: Payer: Self-pay | Admitting: *Deleted

## 2018-12-05 NOTE — Telephone Encounter (Signed)
Pts wife calling, pt present during call. Triaged yesterday for temp of 100.5, diarrhea. States diarrhea has subsided with imodium. Reports temp this afternoon 102.8 and 103.0 tympanic thermometer. HAs been taking 8hr tylenol . Took 76min prior to call. Has been taking every 8 hours. States temp "Creeps up just prior to time for tylenol due."  Denies any other symptoms; no abdominal pain, no nausea, no cough, no cold symptoms.States is staying hydrated, drinking gatorade. Voiding WNL, no dysuria.Declines UC per disposition.  Pts email verified. Wife states she did her own appt yesterday with Dr. Lorelei Pont via video. Please advise regarding appt:  CB: [phone]  Reason for Disposition . [1] Fever > 101 F (38.3 C) AND [2] age > 31  Answer Assessment - Initial Assessment Questions 1. TEMPERATURE: "What is the most recent temperature?"  "How was it measured?"      102.8 one ear, 103.0 other ear 2. ONSET: "When did the fever start?"      yesterday 3. SYMPTOMS: "Do you have any other symptoms besides the fever?"  (e.g., colds, headache, sore throat, earache, cough, rash, diarrhea, vomiting, abdominal pain)     no 4. CAUSE: If there are no symptoms, ask: "What do you think is causing the fever?"      unsure 5. CONTACTS: "Does anyone else in the family have an infection?"     no 6. TREATMENT: "What have you done so far to treat this fever?" (e.g., medications)     Tylenol 8 hour 7. IMMUNOCOMPROMISE: "Do you have of the following: diabetes, HIV positive, splenectomy, cancer chemotherapy, chronic steroid treatment, transplant patient, etc."       .  Protocols used: FEVER-A-AH

## 2018-12-06 ENCOUNTER — Telehealth: Payer: Self-pay | Admitting: Internal Medicine

## 2018-12-06 NOTE — Telephone Encounter (Signed)
FYI. Please advise.

## 2018-12-06 NOTE — Telephone Encounter (Signed)
Please advise in absence of PCP 

## 2018-12-06 NOTE — Telephone Encounter (Signed)
I called the patient again. I explained him that based on his symptoms I am concerned that he could have pneumonia, coronavirus or other illnesses and there is the potential that he could deteriorate quickly and get in a very difficult situation including the need of a ventilator or death. He is afraid of going to the ER, I assured him that all the precautions will be taken to prevent him from getting other infections from the ER and to prevent him to pass what ever he has to other patients. Ample time was provided for questions, he at the end of our conversation verbalized that he fully understand the risks. I explained him that we are not seeing pts w/ fever in our office and I cannot treat him further through a phone conversation. Will again route this to the primary care doctor

## 2018-12-06 NOTE — Telephone Encounter (Signed)
The patient reports that 2 weeks ago he had the flu: Describe fever, chills, aches and pains and some cough. After approximately 5 days, he got better however he continue with on and off fever. He has also developed diarrhea, see phone note from yesterday. His temperature was 103.0 and it was associated with chills.  Last fever was yesterday. He is taking Tylenol. Admits to mild cough, no chest pain no difficulty breathing. My advised to the patient is to go to the ER, I am concerned that he may had the flu and now he has pneumonia  since he continue running fevers.  This also could be coronavirus.    The wife was in the background and she was really upset about my advice, I explained her my train of thoughts, she remains skeptical.  At the end of the conversation the patient told me that he will go to the ER. I will forward this to  PCP

## 2018-12-06 NOTE — Telephone Encounter (Signed)
Again I agree with advice given by Dr. Larose Kells

## 2018-12-06 NOTE — Telephone Encounter (Signed)
Pt called back in to schedule an ov with his PCP.  Re-advised pt of advice from Dr. Larose Kells, pt says that he is not going to the ER. Pt says that he would rather see his PCP. I did advise that I would send a message with his concern.

## 2018-12-09 ENCOUNTER — Encounter: Payer: Self-pay | Admitting: Family Medicine

## 2018-12-09 ENCOUNTER — Other Ambulatory Visit: Payer: Self-pay

## 2018-12-09 ENCOUNTER — Ambulatory Visit (INDEPENDENT_AMBULATORY_CARE_PROVIDER_SITE_OTHER): Payer: Medicare Other | Admitting: *Deleted

## 2018-12-09 ENCOUNTER — Ambulatory Visit (INDEPENDENT_AMBULATORY_CARE_PROVIDER_SITE_OTHER): Payer: Medicare Other | Admitting: Family Medicine

## 2018-12-09 VITALS — BP 113/63 | HR 85

## 2018-12-09 DIAGNOSIS — I5022 Chronic systolic (congestive) heart failure: Secondary | ICD-10-CM

## 2018-12-09 DIAGNOSIS — J0110 Acute frontal sinusitis, unspecified: Secondary | ICD-10-CM

## 2018-12-09 DIAGNOSIS — I428 Other cardiomyopathies: Secondary | ICD-10-CM | POA: Diagnosis not present

## 2018-12-09 MED ORDER — AMOXICILLIN-POT CLAVULANATE 875-125 MG PO TABS: 1.0000 | ORAL_TABLET | Freq: Two times a day (BID) | ORAL | 0 refills | Status: DC

## 2018-12-09 NOTE — Patient Instructions (Addendum)
It was good to talk to you today!   Please do come and see me to check on your diabetes as soon as we are back to normal- we are overdue!!!!!  For current symptoms we are going to use Augmentin antibiotic for sinus infection I gave you a 10 day supply- you can stop after 7 days if your symptoms are better  Please do seek care at the ER if you are not doing ok- if any significant shortness of breath or worsening of your other symptoms

## 2018-12-09 NOTE — Progress Notes (Signed)
[place] [facility] [address], [place], [place] [address] [phone]  Date:  12/09/2018   Name:  Bobby Bradley   DOB:  04-17-1946   MRN:  [id number]  PCP:  Bobby Mclean, MD    Chief Complaint: No chief complaint on file.   History of Present Illness:  Bobby Bradley is a 73 y.o. very pleasant male patient who presents with the following: Pt identified by name and DOB Pt with history of DM, cardiomyopathy with ICD, TIA, a fib on chronic anticoagulation, prostate cancer I last saw this patient in May 2019  He does have mild diabetes, most recent A1c of 7.1% back in May-he did not follow-up with me in 4 months as requested.  We did start him on metformin at that time  Last visit with cardiology was in September for electrophysiology consultation-Dr. Curt Bradley had the patient continue his Eliquis, ARB, beta-blocker, Aldactone  The patient called in because he was sick with temp to 103 this past Friday.  My partner Dr. Larose Bradley was quite rightly concerned, and directed him to go to the emergency room.  He spoke to the pt several times on the phone.  Today is Monday- he first got sick about 2 weeks ago.  He noted chills, was "miserable" for 2 days with nauea, poor appetite.  He improved and thought he was ok.  This past wednesday is when he first noted the high fever.  However he noted that his temp then seemed to resolve so he did not go to the ER after all.  Did well for a couple of days, but then temp of 101 this am Mild cough now and then No abd pain No vomiting,some diarrhea off and on.  He has used imodium and that does seem to help No ST  He feels like he might have a sinus infection He notes pressure in his sinuses and ears  No SOB noted He does not feel that he is in any distress or that he needs to go to the ER right now  Not checking his glucose at home     Patient Active Problem List   Diagnosis Date Noted   . Bronchitis 08/17/2018  . Controlled type 2 diabetes mellitus without complication, without long-term current use of insulin ([place]) 02/06/2018  . Cardiomyopathy ([place]) 04/16/2017  . History of TIA (transient ischemic attack) 04/16/2017  . Chronic anticoagulation 04/16/2017  . Paroxysmal atrial fibrillation ([place]) 10/11/2016  . Chronic systolic congestive heart failure ([place]) 10/11/2016  . ICD (implantable cardioverter-defibrillator) in place 10/11/2016  . History of prostate cancer 10/11/2016  . Essential hypertension 10/11/2016    Past Medical History:  Diagnosis Date  . A-fib (Bobby Bradley)   . CHF (congestive heart failure) (Bobby Bradley)   . Essential hypertension   . Hyperlipidemia   . Non-ischemic cardiomyopathy (Bobby Bradley)   . Presence of combination internal cardiac defibrillator (ICD) and pacemaker   . Prostate cancer (Bobby Bradley)   . TIA (transient ischemic attack)     Past Surgical History:  Procedure Laterality Date  . BACK SURGERY    . PROSTATECTOMY      Social History   Tobacco Use  . Smoking status: Former Smoker    Last attempt to quit: 09/12/1983    Years since quitting: 35.2  . Smokeless tobacco: Never Used  Substance Use Topics  . Alcohol use: Yes    Comment: very rare  . Drug use: No  Family History  Problem Relation Age of Onset  . Lung cancer Mother   . Uterine cancer Mother   . Lung cancer Father   . Lung cancer Brother   . Atrial fibrillation Sister     Allergies  Allergen Reactions  . Lisinopril     Extreme diarrhea    Medication list has been reviewed and updated.  Current Outpatient Medications on File Prior to Visit  Medication Sig Dispense Refill  . albuterol (PROVENTIL HFA;VENTOLIN HFA) 108 (90 Base) MCG/ACT inhaler Inhale 2 puffs into the lungs every 6 (six) hours as needed for wheezing or shortness of breath. 1 Inhaler 0  . amLODipine (NORVASC) 10 MG tablet Take 1 tablet (10 mg total) by mouth daily. 90 tablet 3  . apixaban (ELIQUIS) 5 MG TABS tablet  Take 1 tablet (5 mg total) by mouth 2 (two) times daily. 180 tablet 1  . carvedilol (COREG) 25 MG tablet TAKE 1 TABLET(25 MG) BY MOUTH TWICE DAILY 60 tablet 11  . cyclobenzaprine (FLEXERIL) 10 MG tablet Take 1 tablet (10 mg total) by mouth 2 (two) times daily as needed for muscle spasms. 30 tablet 0  . fluticasone (FLONASE) 50 MCG/ACT nasal spray Place 2 sprays into both nostrils daily. 16 g 6  . losartan (COZAAR) 100 MG tablet Take 1 tablet (100 mg total) by mouth daily. 90 tablet 1  . metFORMIN (GLUCOPHAGE) 500 MG tablet Take 1 tablet (500 mg total) by mouth 2 (two) times daily with a meal. 180 tablet 3  . Multiple Vitamin (MULTI VITAMIN DAILY PO) Take 1 tablet by mouth daily.    Bobby Bradley spironolactone (ALDACTONE) 25 MG tablet TAKE 1 TABLET(25 MG) BY MOUTH DAILY 90 tablet 3   No current facility-administered medications on file prior to visit.     Review of Systems:  As per HPI- otherwise negative. No rash No vomiting  Physical Examination: Vitals:   12/09/18 1043  BP: 113/63  Pulse: 85   There were no vitals filed for this visit. There is no height or weight on file to calculate BMI. Ideal Body Weight:   He has not been checking his blood sugar  Temp was 101 this am  113/63, pulse 85 per pt cuff during visit   BP Readings from Last 3 Encounters:  12/09/18 113/63  08/17/18 134/68  05/15/18 130/62    Looks well, no apparent distress or tachypnea    Assessment and Plan: Acute non-recurrent frontal sinusitis - Plan: amoxicillin-clavulanate (AUGMENTIN) 875-125 MG tablet  webex visit today with recent illness He had a significant fever last week and was directed to the ER by Dr. Larose Bradley.  However his fever seemed to abate so he did not go. Today he notes a lower temp, to about 101 degrees, and what he thinks is a sinus infection rx for augmentin for sinusitis and also in case he has any start of pneumonia.  Again went over reasons to visit the ER   Summary to pt via mychart     Signed Lamar Blinks, MD

## 2018-12-10 LAB — CUP PACEART REMOTE DEVICE CHECK
Battery Remaining Longevity: 144 mo
Battery Remaining Percentage: 100 %
Brady Statistic RV Percent Paced: 2 %
Date Time Interrogation Session: 20200330080100
HighPow Impedance: 46 Ohm
Implantable Lead Location: 753860
Implantable Lead Serial Number: 363165
Implantable Pulse Generator Implant Date: 20160308
Lead Channel Pacing Threshold Amplitude: 0.9 V
Lead Channel Pacing Threshold Pulse Width: 0.5 ms
Lead Channel Setting Pacing Amplitude: 2.4 V
Lead Channel Setting Pacing Pulse Width: 0.5 ms
Lead Channel Setting Sensing Sensitivity: 0.6 mV
MDC IDC LEAD IMPLANT DT: 20160308
MDC IDC MSMT LEADCHNL RV IMPEDANCE VALUE: 378 Ohm
Pulse Gen Serial Number: 200269

## 2018-12-18 NOTE — Progress Notes (Signed)
Remote ICD transmission.   

## 2018-12-24 ENCOUNTER — Other Ambulatory Visit: Payer: Self-pay | Admitting: Cardiology

## 2018-12-24 MED ORDER — SPIRONOLACTONE 25 MG PO TABS: ORAL_TABLET | ORAL | 1 refills | Status: DC

## 2018-12-24 NOTE — Telephone Encounter (Signed)
Amlodipine refilled.

## 2018-12-25 ENCOUNTER — Encounter: Payer: Self-pay | Admitting: Cardiology

## 2018-12-25 ENCOUNTER — Telehealth (INDEPENDENT_AMBULATORY_CARE_PROVIDER_SITE_OTHER): Payer: Medicare Other | Admitting: Cardiology

## 2018-12-25 VITALS — BP 108/69 | HR 67 | Ht 72.0 in | Wt 240.0 lb

## 2018-12-25 DIAGNOSIS — I10 Essential (primary) hypertension: Secondary | ICD-10-CM

## 2018-12-25 DIAGNOSIS — I428 Other cardiomyopathies: Secondary | ICD-10-CM

## 2018-12-25 DIAGNOSIS — Z95810 Presence of automatic (implantable) cardiac defibrillator: Secondary | ICD-10-CM

## 2018-12-25 DIAGNOSIS — I480 Paroxysmal atrial fibrillation: Secondary | ICD-10-CM

## 2018-12-25 NOTE — Patient Instructions (Signed)
Medication Instructions:  NO CHANGE If you need a refill on your cardiac medications before your next appointment, please call your pharmacy.   Lab work: Your physician recommends that you return for lab work in: 6-8 WEEKS If you have labs (blood work) drawn today and your tests are completely normal, you will receive your results only by: Marland Kitchen MyChart Message (if you have MyChart) OR . A paper copy in the mail If you have any lab test that is abnormal or we need to change your treatment, we will call you to review the results.  Follow-Up: At [facility], you and your health needs are our priority.  As part of our continuing mission to provide you with exceptional heart care, we have created designated Provider Care Teams.  These Care Teams include your primary Cardiologist (physician) and Advanced Practice Providers (APPs -  Physician Assistants and Nurse Practitioners) who all work together to provide you with the care you need, when you need it. You will need a follow up appointment in 6 months.  Please call our office 2 months in advance to schedule this appointment.  You may see Kirk Ruths MD or one of the following Advanced Practice Providers on your designated Care Team:   Kerin Ransom, PA-C Roby Lofts, [place] . Sande Rives, PA-C

## 2018-12-25 NOTE — Progress Notes (Signed)
Telehealth visit  Evaluation Performed:  Follow-up visit  Date:  12/25/2018   ID:  Bobby Bradley, DOB December 03, 1945, MRN [id number]  Pt location: home Provider location: [place]  PCP:  [place], Gay Filler, MD  Cardiologist:  Dr Stanford Breed Electrophysiologist:  Will Meredith Leeds, MD   Chief Complaint:  FU atrial fibrillation and CHF  History of Present Illness:    FU atrial fibrillation and CHF. Also with prior ICD. Previously moved from [place].Patient apparently had significant ectopy in the past and had catheterization revealing no coronary disease. He later had a TIA and was diagnosed with a cardiomyopathy and paroxysmal atrial fibrillation. Abdominal ultrasound February 2018 showed no aneurysm. Echocardiogram May 2019 showed ejection fraction 45-50%, moderate left ventricular hypertrophy, grade 1 diastolic dysfunction. Since last seen the patient denies any dyspnea on exertion, orthopnea, PND, pedal edema, palpitations, syncope or chest pain.   The patient had symptoms concerning for COVID-19 infection (fever, chills, cough, or new shortness of breath) 1 months ago now improved.    Past Medical History:  Diagnosis Date  . A-fib ([place])   . CHF (congestive heart failure) ([place])   . Essential hypertension   . Hyperlipidemia   . Non-ischemic cardiomyopathy ([place])   . Presence of combination internal cardiac defibrillator (ICD) and pacemaker   . Prostate cancer ([place])   . TIA (transient ischemic attack)    Past Surgical History:  Procedure Laterality Date  . BACK SURGERY    . PROSTATECTOMY       Current Meds  Medication Sig  . albuterol (PROVENTIL HFA;VENTOLIN HFA) 108 (90 Base) MCG/ACT inhaler Inhale 2 puffs into the lungs every 6 (six) hours as needed for wheezing or shortness of breath.  Marland Kitchen amLODipine (NORVASC) 10 MG tablet TAKE 1 TABLET BY MOUTH DAILY  . apixaban (ELIQUIS) 5 MG TABS tablet Take 1 tablet (5 mg total) by mouth 2 (two) times daily.  . carvedilol (COREG)  25 MG tablet TAKE 1 TABLET(25 MG) BY MOUTH TWICE DAILY  . cyclobenzaprine (FLEXERIL) 10 MG tablet Take 1 tablet (10 mg total) by mouth 2 (two) times daily as needed for muscle spasms.  . fluticasone (FLONASE) 50 MCG/ACT nasal spray Place 2 sprays into both nostrils daily.  Marland Kitchen losartan (COZAAR) 100 MG tablet Take 1 tablet (100 mg total) by mouth daily.  . metFORMIN (GLUCOPHAGE) 500 MG tablet Take 1 tablet (500 mg total) by mouth 2 (two) times daily with a meal.  . Multiple Vitamin (MULTI VITAMIN DAILY PO) Take 1 tablet by mouth daily.  Marland Kitchen spironolactone (ALDACTONE) 25 MG tablet TAKE 1 TABLET(25 MG) BY MOUTH DAILY     Allergies:   Lisinopril   Social History   Tobacco Use  . Smoking status: Former Smoker    Last attempt to quit: 09/12/1983    Years since quitting: 35.3  . Smokeless tobacco: Never Used  Substance Use Topics  . Alcohol use: Yes    Comment: very rare  . Drug use: No     Family Hx: The patient's family history includes Atrial fibrillation in his sister; Lung cancer in his brother, father, and mother; Uterine cancer in his mother.  ROS:   Please see the history of present illness.    Recent malaise, fever now resolved. All other systems reviewed and are negative.  Recent Labs: 01/08/2018: BUN 19; Creatinine, Ser 0.99; Hemoglobin 14.2; Platelets 260; Potassium 4.5; Sodium 137    Wt Readings from Last 3 Encounters:  12/25/18 240 lb (108.9 kg)  08/17/18 248 lb (112.5  kg)  05/15/18 247 lb (112 kg)     Objective:    Vital Signs:  BP 108/69   Pulse 67   Ht 6' (1.829 m)   Wt 240 lb (108.9 kg)   BMI 32.55 kg/m    Well nourished, well developed male in no acute distress. Remainder of clinical exam not performed as telehealth visit (corona virus pandemic).  ASSESSMENT & PLAN:    1. PAF-plan to continue beta blocker for rate control if atrial fibrillation recurs; continue apixaban. Check Hgb and renal function. 2. Nonischemic CM-continue ARB and beta blocker;  continue spironolactone; check bmet. LV function improved on most recent echo. No CHF clinically. 3. Hypertension-BP controlled; continue present meds and follow. 4. Prior ICD-per EP.  COVID-19 Education: The signs and symptoms of COVID-19 were discussed with the patient and how to seek care for testing (follow up with PCP or arrange E-visit).  The importance of social distancing was discussed today.  Time:   Today, I have spent 20 minutes with the patient with telehealth technology discussing the above problems.     Medication Adjustments/Labs and Tests Ordered: Current medicines are reviewed at length with the patient today.  Concerns regarding medicines are outlined above.   Tests Ordered: No orders of the defined types were placed in this encounter.   Medication Changes: No orders of the defined types were placed in this encounter.   Disposition:  Follow up 6 months  Signed, Kirk Ruths, MD  12/25/2018 9:33 AM    [place]

## 2019-01-03 ENCOUNTER — Other Ambulatory Visit: Payer: Self-pay | Admitting: Physician Assistant

## 2019-01-08 ENCOUNTER — Telehealth: Payer: Medicare Other | Admitting: Cardiology

## 2019-01-13 ENCOUNTER — Other Ambulatory Visit: Payer: Self-pay | Admitting: Physician Assistant

## 2019-01-13 NOTE — Telephone Encounter (Signed)
Pt is a 73 yr old male who saw Dr. Stanford Breed via [place] on 12/25/18 due to Covid-19. Pts last noted SCr was 0.99 on 01/08/18, he does have pending order for redraw in epic. Weight on 08/17/18 was 112.5Kg. Will refill Eliquis 5mg  BID for 3 mths.

## 2019-01-22 ENCOUNTER — Encounter: Payer: Self-pay | Admitting: Family Medicine

## 2019-01-27 ENCOUNTER — Other Ambulatory Visit: Payer: Self-pay | Admitting: Family Medicine

## 2019-01-27 DIAGNOSIS — E119 Type 2 diabetes mellitus without complications: Secondary | ICD-10-CM

## 2019-03-10 ENCOUNTER — Ambulatory Visit (INDEPENDENT_AMBULATORY_CARE_PROVIDER_SITE_OTHER): Payer: Medicare Other | Admitting: *Deleted

## 2019-03-10 DIAGNOSIS — I428 Other cardiomyopathies: Secondary | ICD-10-CM

## 2019-03-11 ENCOUNTER — Telehealth: Payer: Self-pay

## 2019-03-11 NOTE — Telephone Encounter (Signed)
Left message for patient to remind of missed remote transmission.  

## 2019-03-12 LAB — CUP PACEART REMOTE DEVICE CHECK
Battery Remaining Longevity: 144 mo
Battery Remaining Percentage: 100 %
Brady Statistic RV Percent Paced: 3 %
Date Time Interrogation Session: 20200701211400
HighPow Impedance: 46 Ohm
Implantable Lead Implant Date: 20160308
Implantable Lead Location: 753860
Implantable Lead Model: 295
Implantable Lead Serial Number: 363165
Implantable Pulse Generator Implant Date: 20160308
Lead Channel Impedance Value: 383 Ohm
Lead Channel Pacing Threshold Amplitude: 0.9 V
Lead Channel Pacing Threshold Pulse Width: 0.5 ms
Lead Channel Setting Pacing Amplitude: 2.4 V
Lead Channel Setting Pacing Pulse Width: 0.5 ms
Lead Channel Setting Sensing Sensitivity: 0.6 mV
Pulse Gen Serial Number: 200269

## 2019-03-21 ENCOUNTER — Encounter: Payer: Self-pay | Admitting: Cardiology

## 2019-03-21 NOTE — Progress Notes (Signed)
Remote ICD transmission.   

## 2019-04-10 ENCOUNTER — Encounter: Payer: Self-pay | Admitting: *Deleted

## 2019-05-06 DIAGNOSIS — I480 Paroxysmal atrial fibrillation: Secondary | ICD-10-CM | POA: Diagnosis not present

## 2019-05-07 LAB — BASIC METABOLIC PANEL
BUN/Creatinine Ratio: 13 (ref 10–24)
BUN: 15 mg/dL (ref 8–27)
CO2: 21 mmol/L (ref 20–29)
Calcium: 9.4 mg/dL (ref 8.6–10.2)
Chloride: 102 mmol/L (ref 96–106)
Creatinine, Ser: 1.18 mg/dL (ref 0.76–1.27)
GFR calc Af Amer: 71 mL/min/{1.73_m2} (ref >59)
GFR calc non Af Amer: 62 mL/min/{1.73_m2} (ref >59)
Glucose: 131 mg/dL — ABNORMAL HIGH (ref 65–99)
Potassium: 4.7 mmol/L (ref 3.5–5.2)
Sodium: 139 mmol/L (ref 134–144)

## 2019-05-07 LAB — CBC
Hematocrit: 39.5 % (ref 37.5–51.0)
Hemoglobin: 13.5 g/dL (ref 13.0–17.7)
MCH: 29.3 pg (ref 26.6–33.0)
MCHC: 34.2 g/dL (ref 31.5–35.7)
MCV: 86 fL (ref 79–97)
Platelets: 246 10*3/uL (ref 150–450)
RBC: 4.61 x10E6/uL (ref 4.14–5.80)
RDW: 13.2 % (ref 11.6–15.4)
WBC: 7.9 10*3/uL (ref 3.4–10.8)

## 2019-06-09 ENCOUNTER — Ambulatory Visit (INDEPENDENT_AMBULATORY_CARE_PROVIDER_SITE_OTHER): Payer: Medicare Other | Admitting: *Deleted

## 2019-06-09 DIAGNOSIS — I429 Cardiomyopathy, unspecified: Secondary | ICD-10-CM

## 2019-06-09 DIAGNOSIS — I480 Paroxysmal atrial fibrillation: Secondary | ICD-10-CM

## 2019-06-09 LAB — CUP PACEART REMOTE DEVICE CHECK
Battery Remaining Longevity: 144 mo
Battery Remaining Percentage: 100 %
Brady Statistic RV Percent Paced: 3 %
Date Time Interrogation Session: 20200928054758
HighPow Impedance: 54 Ohm
Implantable Lead Implant Date: 20160308
Implantable Lead Location: 753860
Implantable Lead Model: 295
Implantable Lead Serial Number: 363165
Implantable Pulse Generator Implant Date: 20160308
Lead Channel Impedance Value: 405 Ohm
Lead Channel Pacing Threshold Amplitude: 0.9 V
Lead Channel Pacing Threshold Pulse Width: 0.5 ms
Lead Channel Setting Pacing Amplitude: 2.4 V
Lead Channel Setting Pacing Pulse Width: 0.5 ms
Lead Channel Setting Sensing Sensitivity: 0.6 mV
Pulse Gen Serial Number: 200269

## 2019-06-10 ENCOUNTER — Ambulatory Visit (INDEPENDENT_AMBULATORY_CARE_PROVIDER_SITE_OTHER): Payer: Medicare Other

## 2019-06-10 ENCOUNTER — Other Ambulatory Visit: Payer: Self-pay

## 2019-06-10 DIAGNOSIS — Z23 Encounter for immunization: Secondary | ICD-10-CM

## 2019-06-18 NOTE — Progress Notes (Signed)
Remote ICD transmission.   

## 2019-06-19 ENCOUNTER — Other Ambulatory Visit: Payer: Self-pay | Admitting: *Deleted

## 2019-06-19 ENCOUNTER — Telehealth: Payer: Self-pay | Admitting: Cardiology

## 2019-06-19 MED ORDER — SPIRONOLACTONE 25 MG PO TABS: ORAL_TABLET | ORAL | 0 refills | Status: DC

## 2019-06-19 NOTE — Telephone Encounter (Signed)
°*  STAT* If patient is at the pharmacy, call can be transferred to refill team.   1. Which medications need to be refilled? (please list name of each medication and dose if known)  Spironolactone  2. Which pharmacy/location (including street and city if local pharmacy) is medication to be sent to? [facility] - [address] [place], [place]  3. Do they need a 30 day or 90 day supply? 90 days and refills

## 2019-06-19 NOTE — Telephone Encounter (Signed)
Requested Prescriptions   Signed Prescriptions Disp Refills  . spironolactone (ALDACTONE) 25 MG tablet 90 tablet 0    Sig: TAKE 1 TABLET(25 MG) BY MOUTH DAILY    Authorizing Provider: Lelon Perla    Ordering User: Britt Bottom

## 2019-07-01 ENCOUNTER — Other Ambulatory Visit: Payer: Self-pay

## 2019-07-01 MED ORDER — CARVEDILOL 25 MG PO TABS: ORAL_TABLET | ORAL | 11 refills | Status: DC

## 2019-07-03 ENCOUNTER — Other Ambulatory Visit: Payer: Self-pay | Admitting: Cardiology

## 2019-07-03 MED ORDER — AMLODIPINE BESYLATE 10 MG PO TABS: 10.0000 mg | ORAL_TABLET | Freq: Every day | ORAL | 0 refills | Status: DC

## 2019-07-09 ENCOUNTER — Other Ambulatory Visit: Payer: Self-pay

## 2019-07-09 MED ORDER — APIXABAN 5 MG PO TABS: 5.0000 mg | ORAL_TABLET | Freq: Two times a day (BID) | ORAL | 1 refills | Status: DC

## 2019-07-30 ENCOUNTER — Other Ambulatory Visit: Payer: Self-pay | Admitting: Family Medicine

## 2019-07-30 DIAGNOSIS — E119 Type 2 diabetes mellitus without complications: Secondary | ICD-10-CM

## 2019-08-04 NOTE — Progress Notes (Signed)
HPI: FU atrial fibrillation and CHF. Also with prior ICD. Previously moved from [place].Patient apparently had significant ectopy in the past and had catheterization revealing no coronary disease. He later had a TIA and was diagnosed with a cardiomyopathy and paroxysmal atrial fibrillation. Abdominal ultrasound February 2018 showed no aneurysm. Echocardiogram May 2019 showed ejection fraction 45-50%, moderate left ventricular hypertrophy, grade 1 diastolic dysfunction. Since last seen the patient denies any dyspnea on exertion, orthopnea, PND, pedal edema, palpitations, syncope or chest pain.   Current Outpatient Medications  Medication Sig Dispense Refill  . albuterol (PROVENTIL HFA;VENTOLIN HFA) 108 (90 Base) MCG/ACT inhaler Inhale 2 puffs into the lungs every 6 (six) hours as needed for wheezing or shortness of breath. 1 Inhaler 0  . amLODipine (NORVASC) 10 MG tablet Take 1 tablet (10 mg total) by mouth daily. 90 tablet 0  . apixaban (ELIQUIS) 5 MG TABS tablet Take 1 tablet (5 mg total) by mouth 2 (two) times daily. 180 tablet 1  . carvedilol (COREG) 25 MG tablet TAKE 1 TABLET(25 MG) BY MOUTH TWICE DAILY 60 tablet 11  . cyclobenzaprine (FLEXERIL) 10 MG tablet Take 1 tablet (10 mg total) by mouth 2 (two) times daily as needed for muscle spasms. 30 tablet 0  . fluticasone (FLONASE) 50 MCG/ACT nasal spray Place 2 sprays into both nostrils daily. 16 g 6  . losartan (COZAAR) 100 MG tablet TAKE 1 TABLET BY MOUTH DAILY 90 tablet 2  . metFORMIN (GLUCOPHAGE) 500 MG tablet TAKE 1 TABLET BY MOUTH TWICE DAILY WITH MEALS 180 tablet 1  . Multiple Vitamin (MULTI VITAMIN DAILY PO) Take 1 tablet by mouth daily.    . rosuvastatin (CRESTOR) 10 MG tablet Take 1 tablet (10 mg total) by mouth daily. 90 tablet 3  . spironolactone (ALDACTONE) 25 MG tablet TAKE 1 TABLET(25 MG) BY MOUTH DAILY 90 tablet 0   No current facility-administered medications for this visit.      Past Medical History:  Diagnosis  Date  . A-fib ([place])   . CHF (congestive heart failure) ([place])   . Essential hypertension   . Hyperlipidemia   . Non-ischemic cardiomyopathy ([place])   . Presence of combination internal cardiac defibrillator (ICD) and pacemaker   . Prostate cancer ([place])   . TIA (transient ischemic attack)     Past Surgical History:  Procedure Laterality Date  . BACK SURGERY    . PROSTATECTOMY      Social History   Socioeconomic History  . Marital status: Married    Spouse name: Not on file  . Number of children: 7  . Years of education: Not on file  . Highest education level: Not on file  Occupational History  . Occupation: Art therapist   Social Needs  . Financial resource strain: Not on file  . Food insecurity    Worry: Not on file    Inability: Not on file  . Transportation needs    Medical: Not on file    Non-medical: Not on file  Tobacco Use  . Smoking status: Former Smoker    Quit date: 09/12/1983    Years since quitting: 35.9  . Smokeless tobacco: Never Used  Substance and Sexual Activity  . Alcohol use: Yes    Comment: very rare  . Drug use: No  . Sexual activity: Not on file  Lifestyle  . Physical activity    Days per week: Not on file    Minutes per session: Not on file  .  Stress: Not on file  Relationships  . Social Herbalist on phone: Not on file    Gets together: Not on file    Attends religious service: Not on file    Active member of club or organization: Not on file    Attends meetings of clubs or organizations: Not on file    Relationship status: Not on file  . Intimate partner violence    Fear of current or ex partner: Not on file    Emotionally abused: Not on file    Physically abused: Not on file    Forced sexual activity: Not on file  Other Topics Concern  . Not on file  Social History Narrative  . Not on file    Family History  Problem Relation Age of Onset  . Lung cancer Mother   . Uterine cancer Mother   . Lung cancer  Father   . Lung cancer Brother   . Atrial fibrillation Sister     ROS: no fevers or chills, productive cough, hemoptysis, dysphasia, odynophagia, melena, hematochezia, dysuria, hematuria, rash, seizure activity, orthopnea, PND, pedal edema, claudication. Remaining systems are negative.  Physical Exam: Well-developed well-nourished in no acute distress.  Skin is warm and dry.  HEENT is normal.  Neck is supple.  Chest is clear to auscultation with normal expansion.  Cardiovascular exam is regular rate and rhythm.  Abdominal exam nontender or distended. No masses palpated. Extremities show no edema. neuro grossly intact  ECG-sinus rhythm with PVCs, first-degree AV block, no ST changes.  Personally reviewed  A/P  1 paroxysmal atrial fibrillation-patient remains in sinus rhythm today.  Continue beta-blocker at present dose.  Continue apixaban.  2 hypertension-patient's blood pressure is controlled.  Continue present medications and follow.  3 history of nonischemic cardiomyopathy- LV function improved on most recent echocardiogram.  Continue ARB, beta-blocker and spironolactone.  Check potassium and renal function.  4 prior ICD-per EP.  Kirk Ruths, MD

## 2019-08-05 NOTE — Progress Notes (Addendum)
[facility] [place], [place], [place] [address] [phone]  Date:  08/06/2019   Name:  Bobby Bradley   DOB:  [birth year]   MRN:  [id number]  PCP:  Darreld Mclean, MD    Chief Complaint: Skin tag removal (near right eyelid)   History of Present Illness:  Bobby Bradley is a 73 y.o. very pleasant male patient who presents with the following:  Here today to discuss a skin tag on his face History of diabetes, cardiomyopathy, TIA, A. fib on anticoagulation Eliquis, CHF, ICD in place, hypertension, prostate cancer  I saw him in the office in March of this year for virtual visit due to illness-prior to that, last seen by myself in May 2019  Lab Results  Component Value Date   HGBA1C 7.1 (H) 02/06/2018   Foot exam Eye exam- due, asked him to schedule Colon cancer screening- he needs referral for screening, last colon done about 5 years ago in [place] He was asked for 5 year follow-up but has not yet established with a local gastroenterologist Flu shot is done Needs hep C screening and labs Pneumonia vaccine- needs prevnar   He has a skin tag on his right upper lid which is annoying  It has been there for 2 to 3 years, seems to be getting longer.  It is bothersome to him and he would like it removed  Patient Active Problem List   Diagnosis Date Noted  . Bronchitis 08/17/2018  . Controlled type 2 diabetes mellitus without complication, without long-term current use of insulin ([place]) 02/06/2018  . Cardiomyopathy ([place]) 04/16/2017  . History of TIA (transient ischemic attack) 04/16/2017  . Chronic anticoagulation 04/16/2017  . Paroxysmal atrial fibrillation ([place]) 10/11/2016  . Chronic systolic congestive heart failure ([place]) 10/11/2016  . ICD (implantable cardioverter-defibrillator) in place 10/11/2016  . History of prostate cancer 10/11/2016  . Essential hypertension 10/11/2016    Past Medical History:   Diagnosis Date  . A-fib ([place])   . CHF (congestive heart failure) ([place])   . Essential hypertension   . Hyperlipidemia   . Non-ischemic cardiomyopathy ([place])   . Presence of combination internal cardiac defibrillator (ICD) and pacemaker   . Prostate cancer ([place])   . TIA (transient ischemic attack)     Past Surgical History:  Procedure Laterality Date  . BACK SURGERY    . PROSTATECTOMY      Social History   Tobacco Use  . Smoking status: Former Smoker    Quit date: 09/12/1983    Years since quitting: 35.9  . Smokeless tobacco: Never Used  Substance Use Topics  . Alcohol use: Yes    Comment: very rare  . Drug use: No    Family History  Problem Relation Age of Onset  . Lung cancer Mother   . Uterine cancer Mother   . Lung cancer Father   . Lung cancer Brother   . Atrial fibrillation Sister     Allergies  Allergen Reactions  . Lisinopril     Extreme diarrhea    Medication list has been reviewed and updated.  Current Outpatient Medications on File Prior to Visit  Medication Sig Dispense Refill  . albuterol (PROVENTIL HFA;VENTOLIN HFA) 108 (90 Base) MCG/ACT inhaler Inhale 2 puffs into the lungs every 6 (six) hours as needed for wheezing or shortness of breath. 1 Inhaler 0  . amLODipine (NORVASC) 10 MG tablet Take 1 tablet (10 mg total)  by mouth daily. 90 tablet 0  . apixaban (ELIQUIS) 5 MG TABS tablet Take 1 tablet (5 mg total) by mouth 2 (two) times daily. 180 tablet 1  . carvedilol (COREG) 25 MG tablet TAKE 1 TABLET(25 MG) BY MOUTH TWICE DAILY 60 tablet 11  . cyclobenzaprine (FLEXERIL) 10 MG tablet Take 1 tablet (10 mg total) by mouth 2 (two) times daily as needed for muscle spasms. 30 tablet 0  . fluticasone (FLONASE) 50 MCG/ACT nasal spray Place 2 sprays into both nostrils daily. 16 g 6  . losartan (COZAAR) 100 MG tablet TAKE 1 TABLET BY MOUTH DAILY 90 tablet 2  . metFORMIN (GLUCOPHAGE) 500 MG tablet TAKE 1 TABLET BY MOUTH TWICE DAILY WITH MEALS 180 tablet 1  .  Multiple Vitamin (MULTI VITAMIN DAILY PO) Take 1 tablet by mouth daily.    Marland Kitchen spironolactone (ALDACTONE) 25 MG tablet TAKE 1 TABLET(25 MG) BY MOUTH DAILY 90 tablet 0   No current facility-administered medications on file prior to visit.     Review of Systems:  As per HPI- otherwise negative. No fever or chills, no chest pain or shortness of breath  Physical Examination: Vitals:   08/06/19 1057  BP: 122/80  Pulse: 65  Resp: 18  Temp: (!) 96 F (35.6 C)  SpO2: 98%   Vitals:   08/06/19 1057  Weight: 247 lb (112 kg)  Height: 6' (1.829 m)   Body mass index is 33.5 kg/m. Ideal Body Weight: Weight in (lb) to have BMI = 25: 183.9  GEN: WDWN, NAD, Non-toxic, A & O x 3 HEENT: Atraumatic, Normocephalic. Neck supple. No masses, No LAD. Ears and Nose: No external deformity. CV: RRR, No M/G/R. No JVD. No thrill. No extra heart sounds. PULM: CTA B, no wheezes, crackles, rhonchi. No retractions. No resp. distress. No accessory muscle use. ABD: S, NT, ND, +BS. No rebound. No HSM. EXTR: No c/c/e NEURO Normal gait.  PSYCH: Normally interactive. Conversant. Not depressed or anxious appearing.  Calm demeanor.  He has an approximately 4 mm long skin tag superior lateral to the right eye on a small stalk  Verbal consent obtained.  Patient aware I will not use anesthesia due to location.  Skin tag prepped with alcohol swab.  Skin tag grasped with forceps, and removed with derma blade.  Used silver nitrate stick for hemostasis and placed Band-Aid.  Patient tolerated well, estimated blood loss less than 1 mL.  No immediate complications  Assessment and Plan: Skin tag  Controlled type 2 diabetes mellitus without complication, without long-term current use of insulin ([facility]) - Plan: Comprehensive metabolic panel, Hemoglobin A1c  History of TIA (transient ischemic attack) - Plan: Lipid panel  Essential hypertension  Chronic anticoagulation - Plan: CBC  Encounter for hepatitis C screening test  for low risk patient - Plan: Hepatitis C antibody  Screening for colon cancer - Plan: Ambulatory referral to Gastroenterology  Immunization due - Plan: Prevnar 13  Encounter for screening laboratory testing for COVID-19 virus - Plan: SAR CoV2 Serology (COVID 19)AB(IGG)IA  Here today for a follow-up visit and skin tag removal.  Skin tag removed in the office as above Refer to GI for screening colonoscopy Routine labs pending as above- Will plan further follow- up pending labs. Encouraged eye exam and shingles vaccine Updated pneumonia vaccine  This visit occurred during the SARS-CoV-2 public health emergency.  Safety protocols were in place, including screening questions prior to the visit, additional usage of staff PPE, and extensive cleaning of exam room  while observing appropriate contact time as indicated for disinfecting solutions.     Signed Lamar Blinks, MD   Received his labs 11/26-message to patient  Results for orders placed or performed in visit on 08/06/19  CBC  Result Value Ref Range   WBC 7.9 4.0 - 10.5 K/uL   RBC 4.72 4.22 - 5.81 Mil/uL   Platelets 244.0 150.0 - 400.0 K/uL   Hemoglobin 13.9 13.0 - 17.0 g/dL   HCT 41.3 39.0 - 52.0 %   MCV 87.5 78.0 - 100.0 fl   MCHC 33.5 30.0 - 36.0 g/dL   RDW 14.6 11.5 - 15.5 %  Comprehensive metabolic panel  Result Value Ref Range   Sodium 138 135 - 145 mEq/L   Potassium 4.5 3.5 - 5.1 mEq/L   Chloride 105 96 - 112 mEq/L   CO2 24 19 - 32 mEq/L   Glucose, Bld 138 (H) 70 - 99 mg/dL   BUN 16 6 - 23 mg/dL   Creatinine, Ser 1.09 0.40 - 1.50 mg/dL   Total Bilirubin 0.6 0.2 - 1.2 mg/dL   Alkaline Phosphatase 80 39 - 117 U/L   AST 18 0 - 37 U/L   ALT 22 0 - 53 U/L   Total Protein 6.9 6.0 - 8.3 g/dL   Albumin 4.0 3.5 - 5.2 g/dL   GFR 66.48 >60.00 mL/min   Calcium 9.2 8.4 - 10.5 mg/dL  Hemoglobin A1c  Result Value Ref Range   Hgb A1c MFr Bld 6.3 4.6 - 6.5 %  Lipid panel  Result Value Ref Range   Cholesterol 213 (H) 0 -  200 mg/dL   Triglycerides 309.0 (H) 0.0 - 149.0 mg/dL   HDL 27.90 (L) >39.00 mg/dL   VLDL 61.8 (H) 0.0 - 40.0 mg/dL   Total CHOL/HDL Ratio 8    NonHDL 185.50   LDL cholesterol, direct  Result Value Ref Range   Direct LDL 113.0 mg/dL   The 10-year ASCVD risk score Mikey Bussing DC Jr., et al., 2013) is: 46.8%   Values used to calculate the score:     Age: 67 years     Sex: Male     Is Non-Hispanic African American: No     Diabetic: Yes     Tobacco smoker: No     Systolic Blood Pressure: 123XX123 mmHg     Is BP treated: Yes     HDL Cholesterol: 27.9 mg/dL     Total Cholesterol: 213 mg/dL

## 2019-08-05 NOTE — Patient Instructions (Addendum)
It was nice to see you again today, I will be in touch with your labs ASAP Please have the shingles vaccine given at your pharmacy, at your convenience  You got your pneumonia booster today Please see your eye doctor at your convenience  We removed a skin tag from your face today- if any concerns about complication or infection please call me right away. Otherwise cover with a band- aid as needed.  Should heal within 2-3 days

## 2019-08-06 ENCOUNTER — Ambulatory Visit (INDEPENDENT_AMBULATORY_CARE_PROVIDER_SITE_OTHER): Payer: Medicare Other | Admitting: Family Medicine

## 2019-08-06 ENCOUNTER — Encounter: Payer: Self-pay | Admitting: Gastroenterology

## 2019-08-06 ENCOUNTER — Encounter: Payer: Self-pay | Admitting: Family Medicine

## 2019-08-06 ENCOUNTER — Other Ambulatory Visit: Payer: Self-pay

## 2019-08-06 VITALS — BP 122/80 | HR 65 | Temp 96.0°F | Resp 18 | Ht 72.0 in | Wt 247.0 lb

## 2019-08-06 DIAGNOSIS — L918 Other hypertrophic disorders of the skin: Secondary | ICD-10-CM | POA: Diagnosis not present

## 2019-08-06 DIAGNOSIS — Z8673 Personal history of transient ischemic attack (TIA), and cerebral infarction without residual deficits: Secondary | ICD-10-CM | POA: Diagnosis not present

## 2019-08-06 DIAGNOSIS — Z1211 Encounter for screening for malignant neoplasm of colon: Secondary | ICD-10-CM

## 2019-08-06 DIAGNOSIS — I10 Essential (primary) hypertension: Secondary | ICD-10-CM

## 2019-08-06 DIAGNOSIS — Z1159 Encounter for screening for other viral diseases: Secondary | ICD-10-CM

## 2019-08-06 DIAGNOSIS — Z20822 Contact with and (suspected) exposure to covid-19: Secondary | ICD-10-CM

## 2019-08-06 DIAGNOSIS — Z7901 Long term (current) use of anticoagulants: Secondary | ICD-10-CM | POA: Diagnosis not present

## 2019-08-06 DIAGNOSIS — E119 Type 2 diabetes mellitus without complications: Secondary | ICD-10-CM

## 2019-08-06 DIAGNOSIS — Z20828 Contact with and (suspected) exposure to other viral communicable diseases: Secondary | ICD-10-CM | POA: Diagnosis not present

## 2019-08-06 DIAGNOSIS — Z23 Encounter for immunization: Secondary | ICD-10-CM

## 2019-08-06 LAB — COMPREHENSIVE METABOLIC PANEL
ALT: 22 U/L (ref 0–53)
AST: 18 U/L (ref 0–37)
Albumin: 4 g/dL (ref 3.5–5.2)
Alkaline Phosphatase: 80 U/L (ref 39–117)
BUN: 16 mg/dL (ref 6–23)
CO2: 24 mEq/L (ref 19–32)
Calcium: 9.2 mg/dL (ref 8.4–10.5)
Chloride: 105 mEq/L (ref 96–112)
Creatinine, Ser: 1.09 mg/dL (ref 0.40–1.50)
GFR: 66.48 mL/min (ref >60.00)
Glucose, Bld: 138 mg/dL — ABNORMAL HIGH (ref 70–99)
Potassium: 4.5 mEq/L (ref 3.5–5.1)
Sodium: 138 mEq/L (ref 135–145)
Total Bilirubin: 0.6 mg/dL (ref 0.2–1.2)
Total Protein: 6.9 g/dL (ref 6.0–8.3)

## 2019-08-06 LAB — CBC
HCT: 41.3 % (ref 39.0–52.0)
Hemoglobin: 13.9 g/dL (ref 13.0–17.0)
MCHC: 33.5 g/dL (ref 30.0–36.0)
MCV: 87.5 fl (ref 78.0–100.0)
Platelets: 244 10*3/uL (ref 150.0–400.0)
RBC: 4.72 Mil/uL (ref 4.22–5.81)
RDW: 14.6 % (ref 11.5–15.5)
WBC: 7.9 10*3/uL (ref 4.0–10.5)

## 2019-08-06 LAB — LIPID PANEL
Cholesterol: 213 mg/dL — ABNORMAL HIGH (ref 0–200)
HDL: 27.9 mg/dL — ABNORMAL LOW (ref >39.00)
NonHDL: 185.5
Total CHOL/HDL Ratio: 8
Triglycerides: 309 mg/dL — ABNORMAL HIGH (ref 0.0–149.0)
VLDL: 61.8 mg/dL — ABNORMAL HIGH (ref 0.0–40.0)

## 2019-08-06 LAB — HEMOGLOBIN A1C: Hgb A1c MFr Bld: 6.3 % (ref 4.6–6.5)

## 2019-08-06 LAB — LDL CHOLESTEROL, DIRECT: Direct LDL: 113 mg/dL

## 2019-08-07 ENCOUNTER — Encounter: Payer: Self-pay | Admitting: Family Medicine

## 2019-08-07 DIAGNOSIS — E785 Hyperlipidemia, unspecified: Secondary | ICD-10-CM

## 2019-08-08 LAB — HEPATITIS C ANTIBODY
Hepatitis C Ab: NONREACTIVE
SIGNAL TO CUT-OFF: 0.01 (ref <1.00)

## 2019-08-08 LAB — SAR COV2 SEROLOGY (COVID19)AB(IGG),IA: SARS CoV2 AB IGG: NEGATIVE

## 2019-08-08 MED ORDER — ROSUVASTATIN CALCIUM 10 MG PO TABS: 10.0000 mg | ORAL_TABLET | Freq: Every day | ORAL | 3 refills | Status: DC

## 2019-08-13 ENCOUNTER — Ambulatory Visit (INDEPENDENT_AMBULATORY_CARE_PROVIDER_SITE_OTHER): Payer: Medicare Other | Admitting: Cardiology

## 2019-08-13 ENCOUNTER — Encounter: Payer: Self-pay | Admitting: Cardiology

## 2019-08-13 ENCOUNTER — Other Ambulatory Visit: Payer: Self-pay

## 2019-08-13 VITALS — BP 123/68 | HR 64 | Ht 72.0 in | Wt 250.0 lb

## 2019-08-13 DIAGNOSIS — I10 Essential (primary) hypertension: Secondary | ICD-10-CM | POA: Diagnosis not present

## 2019-08-13 DIAGNOSIS — I429 Cardiomyopathy, unspecified: Secondary | ICD-10-CM

## 2019-08-13 DIAGNOSIS — Z95810 Presence of automatic (implantable) cardiac defibrillator: Secondary | ICD-10-CM

## 2019-08-13 DIAGNOSIS — I480 Paroxysmal atrial fibrillation: Secondary | ICD-10-CM

## 2019-08-13 NOTE — Patient Instructions (Signed)

## 2019-09-08 ENCOUNTER — Ambulatory Visit (INDEPENDENT_AMBULATORY_CARE_PROVIDER_SITE_OTHER): Payer: Medicare Other | Admitting: *Deleted

## 2019-09-08 DIAGNOSIS — I429 Cardiomyopathy, unspecified: Secondary | ICD-10-CM

## 2019-09-08 LAB — CUP PACEART REMOTE DEVICE CHECK
Date Time Interrogation Session: 20201228140044
Implantable Lead Implant Date: 20160308
Implantable Lead Location: 753860
Implantable Lead Model: 295
Implantable Lead Serial Number: 363165
Implantable Pulse Generator Implant Date: 20160308
Pulse Gen Serial Number: 200269

## 2019-09-09 ENCOUNTER — Encounter: Payer: Self-pay | Admitting: Gastroenterology

## 2019-09-09 ENCOUNTER — Other Ambulatory Visit: Payer: Self-pay

## 2019-09-09 ENCOUNTER — Telehealth (INDEPENDENT_AMBULATORY_CARE_PROVIDER_SITE_OTHER): Payer: Medicare Other | Admitting: Gastroenterology

## 2019-09-09 ENCOUNTER — Telehealth: Payer: Self-pay

## 2019-09-09 VITALS — Ht 72.0 in | Wt 250.0 lb

## 2019-09-09 DIAGNOSIS — Z7901 Long term (current) use of anticoagulants: Secondary | ICD-10-CM | POA: Diagnosis not present

## 2019-09-09 DIAGNOSIS — Z1211 Encounter for screening for malignant neoplasm of colon: Secondary | ICD-10-CM

## 2019-09-09 DIAGNOSIS — Z95810 Presence of automatic (implantable) cardiac defibrillator: Secondary | ICD-10-CM | POA: Diagnosis not present

## 2019-09-09 NOTE — Progress Notes (Signed)
Chief Complaint: For colonoscopy.  Referring Provider:  Darreld Mclean, MD      ASSESSMENT AND PLAN;   #1.  Colorectal cancer screening  Plan: - Proceed with colonoscopy with MiraLAX preparation. Discussed risks & benefits. (Risks including rare perforation req laparotomy, bleeding after bx/polypectomy req blood transfusion, rarely missing neoplasms, risks of anesthesia/sedation). Benefits outweigh the risks. Patient agrees to proceed. All the questions were answered.  -Cardiology clearance prior. -Hold Eliquis 24 hours prior. -Please get it cleared from Osvaldo Angst to make sure he satisfies [facility] criteria.  I think he does.    HPI:    Bobby Bradley is a 73 y.o. male  For screening colonoscopy He is on Eliquis due to A. Fib. Has history of CHF- LV EF: 45% -   50% No nausea, vomiting, heartburn, regurgitation, odynophagia or dysphagia.  No significant diarrhea or constipation.  No melena or hematochezia. No unintentional weight loss. No abdominal pain.    Past Medical History:  Diagnosis Date  . A-fib ([facility])   . CHF (congestive heart failure) ([place])   . Essential hypertension   . Hyperlipidemia   . Non-ischemic cardiomyopathy ([place])   . Presence of combination internal cardiac defibrillator (ICD) and pacemaker   . Prostate cancer ([place])   . TIA (transient ischemic attack)     Past Surgical History:  Procedure Laterality Date  . BACK SURGERY    . COLONOSCOPY     about 10 years. Done in [place] or [place]   . PROSTATECTOMY      Family History  Problem Relation Age of Onset  . Lung cancer Mother   . Uterine cancer Mother   . Lung cancer Father   . Lung cancer Brother   . Atrial fibrillation Sister   . Colon cancer Neg Hx   . Esophageal cancer Neg Hx     Social History   Tobacco Use  . Smoking status: Former Smoker    Quit date: 09/12/1983    Years since quitting: 36.0  . Smokeless tobacco: Never Used  Substance Use Topics  . Alcohol  use: Yes    Comment: very rare  . Drug use: No    Current Outpatient Medications  Medication Sig Dispense Refill  . amLODipine (NORVASC) 10 MG tablet Take 1 tablet (10 mg total) by mouth daily. 90 tablet 0  . apixaban (ELIQUIS) 5 MG TABS tablet Take 1 tablet (5 mg total) by mouth 2 (two) times daily. 180 tablet 1  . carvedilol (COREG) 25 MG tablet TAKE 1 TABLET(25 MG) BY MOUTH TWICE DAILY 60 tablet 11  . cyclobenzaprine (FLEXERIL) 10 MG tablet Take 1 tablet (10 mg total) by mouth 2 (two) times daily as needed for muscle spasms. 30 tablet 0  . fluticasone (FLONASE) 50 MCG/ACT nasal spray Place 2 sprays into both nostrils daily. (Patient taking differently: Place 2 sprays into both nostrils as needed. ) 16 g 6  . losartan (COZAAR) 100 MG tablet TAKE 1 TABLET BY MOUTH DAILY 90 tablet 2  . metFORMIN (GLUCOPHAGE) 500 MG tablet TAKE 1 TABLET BY MOUTH TWICE DAILY WITH MEALS 180 tablet 1  . Multiple Vitamin (MULTI VITAMIN DAILY PO) Take 1 tablet by mouth daily.    . rosuvastatin (CRESTOR) 10 MG tablet Take 1 tablet (10 mg total) by mouth daily. 90 tablet 3  . spironolactone (ALDACTONE) 25 MG tablet TAKE 1 TABLET(25 MG) BY MOUTH DAILY 90 tablet 0  . albuterol (PROVENTIL HFA;VENTOLIN HFA) 108 (90 Base) MCG/ACT  inhaler Inhale 2 puffs into the lungs every 6 (six) hours as needed for wheezing or shortness of breath. (Patient not taking: Reported on 09/09/2019) 1 Inhaler 0   No current facility-administered medications for this visit.    Allergies  Allergen Reactions  . Lisinopril     Extreme diarrhea    Review of Systems:  neg     Physical Exam:    Ht 6' (1.829 m)   Wt 250 lb (113.4 kg)   BMI 33.91 kg/m  Wt Readings from Last 3 Encounters:  09/09/19 250 lb (113.4 kg)  08/13/19 250 lb (113.4 kg)  08/06/19 247 lb (112 kg)   Constitutional:  Well-developed, in no acute distress. Psychiatric: Normal mood and affect. Behavior is normal. Not examined since it was a televisit.  Data  Reviewed: I have personally reviewed following labs and imaging studies  CBC: CBC Latest Ref Rng & Units 08/06/2019 05/06/2019 01/08/2018  WBC 4.0 - 10.5 K/uL 7.9 7.9 11.7(H)  Hemoglobin 13.0 - 17.0 g/dL 13.9 13.5 14.2  Hematocrit 39.0 - 52.0 % 41.3 39.5 40.3  Platelets 150.0 - 400.0 K/uL 244.0 246 260    CMP: CMP Latest Ref Rng & Units 08/06/2019 05/06/2019 01/21/2018  Glucose 70 - 99 mg/dL 138(H) 131(H) -  BUN 6 - 23 mg/dL 16 15 -  Creatinine 0.40 - 1.50 mg/dL 1.09 1.18 -  Sodium 135 - 145 mEq/L 138 139 -  Potassium 3.5 - 5.1 mEq/L 4.5 4.7 -  Chloride 96 - 112 mEq/L 105 102 -  CO2 19 - 32 mEq/L 24 21 -  Calcium 8.4 - 10.5 mg/dL 9.2 9.4 9.2  Total Protein 6.0 - 8.3 g/dL 6.9 - -  Total Bilirubin 0.2 - 1.2 mg/dL 0.6 - -  Alkaline Phos 39 - 117 U/L 80 - -  AST 0 - 37 U/L 18 - -  ALT 0 - 53 U/L 22 - -  I connected with  [name] on 09/09/19 by a video enabled telemedicine application and verified that I am speaking with the correct person.   I discussed the limitations of evaluation and management by telemedicine. The patient expressed understanding and agreed to proceed.  Time spent on call/coordination of care: 30 min      Carmell Austria, MD 09/09/2019, 2:08 PM  Cc: Darreld Mclean, MD

## 2019-09-09 NOTE — Telephone Encounter (Signed)
[facility] Pre-operative Risk Assessment     Request for surgical clearance:     Endoscopy Procedure  What type of surgery is being performed?     Colonoscopy  When is this surgery scheduled?     10/20/19  What type of clearance is required ?   Pharmacy  Are there any medications that need to be held prior to surgery and how long? Eliquis x 24 hours   Practice name and name of physician performing surgery?      [facility]  What is your office phone and fax number?      Phone- [phone]  [phone]  Anesthesia type (None, local, MAC, general) ?       [facility]

## 2019-09-09 NOTE — Telephone Encounter (Signed)
Patient with diagnosis of afib on Eliquis for anticoagulation.    Procedure: Colonoscopy Date of procedure: 10/20/2019  CHADS2-VASc score of  5 (CHF, HTN, AGE, stroke/tia x 2)  CrCl 79 ml/min  Per office protocol, patient can hold Elqiuis for 1 day prior to procedure.

## 2019-09-09 NOTE — Telephone Encounter (Signed)
   Primary Cardiologist: Dr Stanford Breed  Chart reviewed as part of pre-operative protocol coverage. Given past medical history and time since last visit, based on ACC/AHA guidelines, Bobby Bradley would be at acceptable risk for the planned procedure without further cardiovascular testing.   OK to hold Eliquis one day pre op.   I will route this recommendation to the requesting party via Epic fax function and remove from pre-op pool.  Please call with questions.  Kerin Ransom, PA-C 09/09/2019, 3:25 PM

## 2019-09-09 NOTE — Patient Instructions (Addendum)
If you are age 72 or older, your body mass index should be between 23-30. Your Body mass index is 33.91 kg/m. If this is out of the aforementioned range listed, please consider follow up with your Primary Care Provider.  If you are age 102 or younger, your body mass index should be between 19-25. Your Body mass index is 33.91 kg/m. If this is out of the aformentioned range listed, please consider follow up with your Primary Care Provider.   You have been scheduled for a colonoscopy. Please follow written instructions given to you at your visit today.  Please pick up your prep supplies at the pharmacy within the next 1-3 days. If you use inhalers (even only as needed), please bring them with you on the day of your procedure. Your physician has requested that you go to www.startemmi.com and enter the access code given to you at your visit today. This web site gives a general overview about your procedure. However, you should still follow specific instructions given to you by our office regarding your preparation for the procedure.  Due to recent COVID-19 restrictions implemented by [employer] and state authorities and in an effort to keep both patients and staff as safe as possible, [facility] requires COVID-19 testing prior to any scheduled endoscopic procedure. The testing center is located at [address] [place], [place] [address] in the [facility]  suite.  Your appointment has been scheduled for 10/17/19 at [facility].   Please bring your insurance cards to this appointment. You will require your COVID screen 2 business days prior to your endoscopic procedure.  You are not required to quarantine after your screening.  You will only receive a phone call with the results if it is POSITIVE.  If you do not receive a call the day before your procedure you should begin your prep, if ordered, and you should report to the endo center for your  procedure at your designated appointment arrival time ( one hour prior to the procedure time). There is no cost to you for the screening on the day of the swab.  [facility] will file with your insurance company for the testing.    You may receive an automated phone call prior to your procedure or have a message in your MyChart that you have an appointment for a BP/15 at the [facility], please disregard this message.  Your testing will be at the [address] [place] location.   If you are leaving [facility] travel [place] on [address], turn left onto [address], turn night onto [address] at the 1st stop light turn right, pass the [employer] on your right and proceed to [place] (white building).    You will be contacted by our office prior to your procedure for directions on holding your Eliquis.  If you do not hear from our office 1 week prior to your scheduled procedure, please call [phone] to discuss.   I have attached a Pre Procedure Patient Acknowledgement form and a prepaid envelope, please initial and sign form and mail back in envelope.     Thank you,  Dr. Jackquline Denmark

## 2019-09-09 NOTE — Telephone Encounter (Signed)
Pharmacy can you comment on holding Eliquis in this patient.  Kerin Ransom PA-C 09/09/2019 3:04 PM

## 2019-09-11 ENCOUNTER — Other Ambulatory Visit: Payer: Self-pay

## 2019-09-11 MED ORDER — LOSARTAN POTASSIUM 100 MG PO TABS: 100.0000 mg | ORAL_TABLET | Freq: Every day | ORAL | 2 refills | Status: DC

## 2019-09-17 ENCOUNTER — Other Ambulatory Visit: Payer: Self-pay

## 2019-09-17 MED ORDER — SPIRONOLACTONE 25 MG PO TABS: ORAL_TABLET | ORAL | 3 refills | Status: DC

## 2019-09-23 ENCOUNTER — Telehealth: Payer: Self-pay

## 2019-09-23 NOTE — Telephone Encounter (Signed)
Patient was notified to hold eliquis one day before procedure. Patient voiced understanding.

## 2019-09-30 ENCOUNTER — Other Ambulatory Visit: Payer: Self-pay

## 2019-09-30 MED ORDER — AMLODIPINE BESYLATE 10 MG PO TABS: 10.0000 mg | ORAL_TABLET | Freq: Every day | ORAL | 3 refills | Status: DC

## 2019-10-07 NOTE — Telephone Encounter (Signed)
Opened in error

## 2019-10-20 ENCOUNTER — Encounter: Payer: Medicare Other | Admitting: Gastroenterology

## 2019-12-08 ENCOUNTER — Ambulatory Visit (INDEPENDENT_AMBULATORY_CARE_PROVIDER_SITE_OTHER): Payer: Medicare Other | Admitting: *Deleted

## 2019-12-08 DIAGNOSIS — I429 Cardiomyopathy, unspecified: Secondary | ICD-10-CM

## 2019-12-08 LAB — CUP PACEART REMOTE DEVICE CHECK
Battery Remaining Longevity: 138 mo
Battery Remaining Percentage: 100 %
Brady Statistic RV Percent Paced: 5 %
Date Time Interrogation Session: 20210329040100
HighPow Impedance: 49 Ohm
Implantable Lead Implant Date: 20160308
Implantable Lead Location: 753860
Implantable Lead Model: 295
Implantable Lead Serial Number: 363165
Implantable Pulse Generator Implant Date: 20160308
Lead Channel Impedance Value: 396 Ohm
Lead Channel Pacing Threshold Amplitude: 0.9 V
Lead Channel Pacing Threshold Pulse Width: 0.5 ms
Lead Channel Setting Pacing Amplitude: 2.4 V
Lead Channel Setting Pacing Pulse Width: 0.5 ms
Lead Channel Setting Sensing Sensitivity: 0.6 mV
Pulse Gen Serial Number: 200269

## 2019-12-08 NOTE — Progress Notes (Signed)
ICD Remote  

## 2019-12-30 ENCOUNTER — Encounter: Payer: Self-pay | Admitting: Family Medicine

## 2020-01-02 ENCOUNTER — Other Ambulatory Visit: Payer: Self-pay | Admitting: Pharmacist Clinician (PhC)/ Clinical Pharmacy Specialist

## 2020-01-02 MED ORDER — APIXABAN 5 MG PO TABS: 5.0000 mg | ORAL_TABLET | Freq: Two times a day (BID) | ORAL | 1 refills | Status: DC

## 2020-01-02 NOTE — Telephone Encounter (Signed)
73 M 113.4 kg SCr 1.09 (07/2019), LOV [place] 08/2019

## 2020-02-03 ENCOUNTER — Other Ambulatory Visit: Payer: Self-pay

## 2020-02-03 DIAGNOSIS — E119 Type 2 diabetes mellitus without complications: Secondary | ICD-10-CM

## 2020-02-03 MED ORDER — METFORMIN HCL 500 MG PO TABS: 500.0000 mg | ORAL_TABLET | Freq: Two times a day (BID) | ORAL | 1 refills | Status: DC

## 2020-03-08 ENCOUNTER — Ambulatory Visit (INDEPENDENT_AMBULATORY_CARE_PROVIDER_SITE_OTHER): Payer: Medicare Other | Admitting: *Deleted

## 2020-03-08 DIAGNOSIS — I5022 Chronic systolic (congestive) heart failure: Secondary | ICD-10-CM

## 2020-03-08 DIAGNOSIS — I429 Cardiomyopathy, unspecified: Secondary | ICD-10-CM | POA: Diagnosis not present

## 2020-03-09 LAB — CUP PACEART REMOTE DEVICE CHECK
Battery Remaining Longevity: 138 mo
Battery Remaining Percentage: 100 %
Brady Statistic RV Percent Paced: 5 %
Date Time Interrogation Session: 20210628040200
HighPow Impedance: 57 Ohm
Implantable Lead Implant Date: 20160308
Implantable Lead Location: 753860
Implantable Lead Model: 295
Implantable Lead Serial Number: 363165
Implantable Pulse Generator Implant Date: 20160308
Lead Channel Impedance Value: 419 Ohm
Lead Channel Pacing Threshold Amplitude: 0.9 V
Lead Channel Pacing Threshold Pulse Width: 0.5 ms
Lead Channel Setting Pacing Amplitude: 2.4 V
Lead Channel Setting Pacing Pulse Width: 0.5 ms
Lead Channel Setting Sensing Sensitivity: 0.6 mV
Pulse Gen Serial Number: 200269

## 2020-03-10 NOTE — Progress Notes (Signed)
Remote ICD transmission.   

## 2020-06-06 ENCOUNTER — Other Ambulatory Visit: Payer: Self-pay | Admitting: Cardiology

## 2020-06-07 ENCOUNTER — Ambulatory Visit (INDEPENDENT_AMBULATORY_CARE_PROVIDER_SITE_OTHER): Payer: Medicare Other | Admitting: Emergency Medicine

## 2020-06-07 DIAGNOSIS — I429 Cardiomyopathy, unspecified: Secondary | ICD-10-CM

## 2020-06-08 ENCOUNTER — Encounter: Payer: Self-pay | Admitting: Family Medicine

## 2020-06-08 ENCOUNTER — Ambulatory Visit: Payer: Medicare Other | Attending: Internal Medicine

## 2020-06-08 DIAGNOSIS — Z23 Encounter for immunization: Secondary | ICD-10-CM

## 2020-06-08 LAB — CUP PACEART REMOTE DEVICE CHECK
Battery Remaining Longevity: 138 mo
Battery Remaining Percentage: 100 %
Brady Statistic RV Percent Paced: 5 %
Date Time Interrogation Session: 20210927040100
HighPow Impedance: 51 Ohm
Implantable Lead Implant Date: 20160308
Implantable Lead Location: 753860
Implantable Lead Model: 295
Implantable Lead Serial Number: 363165
Implantable Pulse Generator Implant Date: 20160308
Lead Channel Impedance Value: 386 Ohm
Lead Channel Pacing Threshold Amplitude: 0.9 V
Lead Channel Pacing Threshold Pulse Width: 0.5 ms
Lead Channel Setting Pacing Amplitude: 2.4 V
Lead Channel Setting Pacing Pulse Width: 0.5 ms
Lead Channel Setting Sensing Sensitivity: 0.6 mV
Pulse Gen Serial Number: 200269

## 2020-06-08 NOTE — Progress Notes (Signed)
° °  Covid-19 Vaccination Clinic  Name:  Bobby Bradley    MRN: [id number] DOB: September 17, 1945  06/08/2020  Bobby Bradley was observed post Covid-19 immunization for 15 minutes without incident. He was provided with Vaccine Information Sheet and instruction to access the V-Safe system.  Vaccinated by Elisha Headland Fidelis  Bobby Bradley was instructed to call 911 with any severe reactions post vaccine:  Difficulty breathing   Swelling of face and throat   A fast heartbeat   A bad rash all over body   Dizziness and weakness

## 2020-06-10 NOTE — Progress Notes (Signed)
Remote ICD transmission.   

## 2020-07-02 ENCOUNTER — Other Ambulatory Visit: Payer: Self-pay | Admitting: Cardiology

## 2020-07-25 ENCOUNTER — Other Ambulatory Visit: Payer: Self-pay | Admitting: Family Medicine

## 2020-07-25 DIAGNOSIS — E785 Hyperlipidemia, unspecified: Secondary | ICD-10-CM

## 2020-07-29 ENCOUNTER — Other Ambulatory Visit: Payer: Self-pay | Admitting: Family Medicine

## 2020-07-29 ENCOUNTER — Other Ambulatory Visit: Payer: Self-pay | Admitting: Cardiology

## 2020-07-29 DIAGNOSIS — E119 Type 2 diabetes mellitus without complications: Secondary | ICD-10-CM

## 2020-07-29 NOTE — Telephone Encounter (Signed)
Dx A.Fib Last OV dr Stanford Breed 08/2019 (already scheduled for 08/25/2020)  74yo Male 113kg Scr = 08/06/2019.

## 2020-08-25 ENCOUNTER — Ambulatory Visit: Payer: Medicare Other | Admitting: Cardiology

## 2020-08-26 ENCOUNTER — Other Ambulatory Visit: Payer: Self-pay | Admitting: Family Medicine

## 2020-08-26 DIAGNOSIS — E119 Type 2 diabetes mellitus without complications: Secondary | ICD-10-CM

## 2020-08-26 DIAGNOSIS — E785 Hyperlipidemia, unspecified: Secondary | ICD-10-CM

## 2020-08-29 ENCOUNTER — Other Ambulatory Visit: Payer: Self-pay | Admitting: Cardiology

## 2020-09-02 ENCOUNTER — Encounter: Payer: Self-pay | Admitting: Family Medicine

## 2020-09-02 DIAGNOSIS — E785 Hyperlipidemia, unspecified: Secondary | ICD-10-CM

## 2020-09-02 MED ORDER — ROSUVASTATIN CALCIUM 10 MG PO TABS: 10.0000 mg | ORAL_TABLET | Freq: Every day | ORAL | 0 refills | Status: DC

## 2020-09-06 ENCOUNTER — Ambulatory Visit (INDEPENDENT_AMBULATORY_CARE_PROVIDER_SITE_OTHER): Payer: Medicare Other

## 2020-09-06 DIAGNOSIS — I5022 Chronic systolic (congestive) heart failure: Secondary | ICD-10-CM

## 2020-09-06 DIAGNOSIS — I429 Cardiomyopathy, unspecified: Secondary | ICD-10-CM

## 2020-09-06 NOTE — Progress Notes (Addendum)
[facility] [place], [place], [place] [address] [id number] [phone]  Date:  09/08/2020   Name:  Bobby Bradley   DOB:  [birth year]   MRN:  [id number]  PCP:  Darreld Mclean, MD    Chief Complaint: Medication Refill (Crestor, wants flu shot)   History of Present Illness:  Bobby Bradley is a 74 y.o. very pleasant male patient who presents with the following:  Pt here today for a follow-up visit  Last seen by myself over a year ago  History of diabetes, cardiomyopathy, TIA, A. fib on anticoagulation Eliquis, CHF, ICD in place, hypertension, prostate cancer s/p prostatectomy Her electrophysiologist is Dr Curt Bears- he notes SOB with exertion but this is stable No shocks received, he keeps up with his ICD checks as directed ?is anyone monitoring his prostate cancer; his prostatectomy was done in 09/2203 He is no longer seeing urology- we will check his PSA today   Foot exam- today  Eye exam- this is due, reminded pt to get this done  Colon cancer screening- his colonoscopy was delayed due to covid, he plans to reschedule this with Dr Octavia Bruckner- he is not sure -Per chart last dose was greater than 10 years ago, he would like to update today if possible shingrix not done yet  Flu vaccine- give today  Labs/ A1c covid series- done including his booster Labs over a year ago   Amlodipine Coreg eliquis Losartan metformin Crestor Spironolactone  Lab Results  Component Value Date   HGBA1C 6.3 08/06/2019    Patient Active Problem List   Diagnosis Date Noted  . Bronchitis 08/17/2018  . Controlled type 2 diabetes mellitus without complication, without long-term current use of insulin ([place]) 02/06/2018  . Cardiomyopathy ([place]) 04/16/2017  . History of TIA (transient ischemic attack) 04/16/2017  . Chronic anticoagulation 04/16/2017  . Paroxysmal atrial fibrillation ([place]) 10/11/2016  . Chronic systolic congestive  heart failure ([place]) 10/11/2016  . ICD (implantable cardioverter-defibrillator) in place 10/11/2016  . History of prostate cancer 10/11/2016  . Essential hypertension 10/11/2016    Past Medical History:  Diagnosis Date  . A-fib ([place])   . CHF (congestive heart failure) ([place])   . Essential hypertension   . Hyperlipidemia   . Non-ischemic cardiomyopathy ([place])   . Presence of combination internal cardiac defibrillator (ICD) and pacemaker   . Prostate cancer ([place])   . TIA (transient ischemic attack)     Past Surgical History:  Procedure Laterality Date  . BACK SURGERY    . COLONOSCOPY     about 10 years. Done in [place] or [place]   . PROSTATECTOMY      Social History   Tobacco Use  . Smoking status: Former Smoker    Quit date: 09/12/1983    Years since quitting: 37.0  . Smokeless tobacco: Never Used  Vaping Use  . Vaping Use: Never used  Substance Use Topics  . Alcohol use: Yes    Comment: very rare  . Drug use: No    Family History  Problem Relation Age of Onset  . Lung cancer Mother   . Uterine cancer Mother   . Lung cancer Father   . Lung cancer Brother   . Atrial fibrillation Sister   . Colon cancer Neg Hx   . Esophageal cancer Neg Hx     Allergies  Allergen Reactions  . Lisinopril     Extreme diarrhea  Medication list has been reviewed and updated.  Current Outpatient Medications on File Prior to Visit  Medication Sig Dispense Refill  . amLODipine (NORVASC) 10 MG tablet Take 1 tablet (10 mg total) by mouth daily. 90 tablet 3  . carvedilol (COREG) 25 MG tablet TAKE 1 TABLET(25 MG) BY MOUTH TWICE DAILY 60 tablet 1  . cyclobenzaprine (FLEXERIL) 10 MG tablet Take 1 tablet (10 mg total) by mouth 2 (two) times daily as needed for muscle spasms. 30 tablet 0  . ELIQUIS 5 MG TABS tablet TAKE 1 TABLET(5 MG) BY MOUTH TWICE DAILY 180 tablet 0  . fluticasone (FLONASE) 50 MCG/ACT nasal spray Place 2 sprays into both nostrils daily. (Patient taking  differently: Place 2 sprays into both nostrils as needed.) 16 g 6  . losartan (COZAAR) 100 MG tablet TAKE 1 TABLET(100 MG) BY MOUTH DAILY 90 tablet 2  . metFORMIN (GLUCOPHAGE) 500 MG tablet Take 1 tablet (500 mg total) by mouth 2 (two) times daily with a meal. 60 tablet 0  . Multiple Vitamin (MULTI VITAMIN DAILY PO) Take 1 tablet by mouth daily.    . rosuvastatin (CRESTOR) 10 MG tablet Take 1 tablet (10 mg total) by mouth daily. 30 tablet 0  . spironolactone (ALDACTONE) 25 MG tablet TAKE 1 TABLET(25 MG) BY MOUTH DAILY 90 tablet 3   No current facility-administered medications on file prior to visit.    Review of Systems:  As per HPI- otherwise negative.   Physical Examination: Vitals:   09/08/20 0857  BP: 122/62  Pulse: 64  Resp: 18  SpO2: 96%   Vitals:   09/08/20 0857  Weight: 249 lb (112.9 kg)   Body mass index is 33.77 kg/m. Ideal Body Weight:    GEN: no acute distress.  Obese, otherwise looks well HEENT: Atraumatic, Normocephalic.   Bilateral TM wnl, oropharynx normal.  PEERL,EOMI.   Ears and Nose: No external deformity. CV: RRR, No M/G/R. No JVD. No thrill. No extra heart sounds. PULM: CTA B, no wheezes, crackles, rhonchi. No retractions. No resp. distress. No accessory muscle use. ABD: S, NT, ND, +BS. No rebound. No HSM. EXTR: No c/c/e PSYCH: Normally interactive. Conversant.  Patient has a wound on the right second knuckle, does not need sutures and is healing well.  Will update tetanus for him today Foot exam is completed today Assessment and Plan: Dyslipidemia - Plan: Lipid panel, rosuvastatin (CRESTOR) 10 MG tablet  Controlled type 2 diabetes mellitus without complication, without long-term current use of insulin (HCC) - Plan: Comprehensive metabolic panel, Hemoglobin A1c, Microalbumin / creatinine urine ratio, metFORMIN (GLUCOPHAGE) 500 MG tablet  History of TIA (transient ischemic attack)  Essential hypertension - Plan: CBC, Comprehensive metabolic panel,  amLODipine (NORVASC) 10 MG tablet, carvedilol (COREG) 25 MG tablet  Chronic anticoagulation  Open wound of right hand, foreign body presence unspecified, unspecified wound type, initial encounter - Plan: Tdap vaccine greater than or equal to 7yo IM  Prostate cancer (HCC) - Plan: PSA  Increased prostate specific antigen (PSA) velocity - Plan: PSA  Needs flu shot - Plan: Flu Vaccine QUAD High Dose(Fluad)  Screening for colon cancer  Patient is here today for routine follow-up visit Labs are pending as above, will check on his lipids and diabetes control Blood pressures under good control on current regimen, medications are refilled History of prostate cancer status post prostatectomy, check PSA today Encourage patient to call his gastroenterologist and arrange colonoscopy soon as possible, also reminded that eye exam is due Flu shot, tetanus  booster given today Discussed Shingrix, recommended that patient have this done at his pharmacy Encouraged continued healthy diet and exercise routine This visit occurred during the SARS-CoV-2 public health emergency.  Safety protocols were in place, including screening questions prior to the visit, additional usage of staff PPE, and extensive cleaning of exam room while observing appropriate contact time as indicated for disinfecting solutions.     Signed Abbe Amsterdam, MD Received his labs 12/29- message to pt  Results for orders placed or performed in visit on 09/08/20  CBC  Result Value Ref Range   WBC 6.3 4.0 - 10.5 K/uL   RBC 4.53 4.22 - 5.81 Mil/uL   Platelets 223.0 150.0 - 400.0 K/uL   Hemoglobin 13.4 13.0 - 17.0 g/dL   HCT 93.8 18.2 - 99.3 %   MCV 87.9 78.0 - 100.0 fl   MCHC 33.8 30.0 - 36.0 g/dL   RDW 71.6 96.7 - 89.3 %  Comprehensive metabolic panel  Result Value Ref Range   Sodium 140 135 - 145 mEq/L   Potassium 4.5 3.5 - 5.1 mEq/L   Chloride 102 96 - 112 mEq/L   CO2 25 19 - 32 mEq/L   Glucose, Bld 147 (H) 70 - 99 mg/dL    BUN 13 6 - 23 mg/dL   Creatinine, Ser 8.10 0.40 - 1.50 mg/dL   Total Bilirubin 0.5 0.2 - 1.2 mg/dL   Alkaline Phosphatase 77 39 - 117 U/L   AST 14 0 - 37 U/L   ALT 19 0 - 53 U/L   Total Protein 7.0 6.0 - 8.3 g/dL   Albumin 4.4 3.5 - 5.2 g/dL   GFR 17.51 >02.58 mL/min   Calcium 9.2 8.4 - 10.5 mg/dL  Hemoglobin N2D  Result Value Ref Range   Hgb A1c MFr Bld 6.5 4.6 - 6.5 %  Lipid panel  Result Value Ref Range   Cholesterol 147 0 - 200 mg/dL   Triglycerides 782.4 (H) 0.0 - 149.0 mg/dL   HDL 23.53 (L) >61.44 mg/dL   VLDL 31.5 (H) 0.0 - 40.0 mg/dL   Total CHOL/HDL Ratio 5    NonHDL 115.64   Microalbumin / creatinine urine ratio  Result Value Ref Range   Microalb, Ur 5.2 (H) 0.0 - 1.9 mg/dL   Creatinine,U 867.6 mg/dL   Microalb Creat Ratio 2.7 0.0 - 30.0 mg/g  PSA  Result Value Ref Range   PSA 0.01 (L) 0.10 - 4.00 ng/mL  LDL cholesterol, direct  Result Value Ref Range   Direct LDL 63.0 mg/dL

## 2020-09-07 LAB — CUP PACEART REMOTE DEVICE CHECK
Battery Remaining Longevity: 138 mo
Battery Remaining Percentage: 100 %
Brady Statistic RV Percent Paced: 5 %
Date Time Interrogation Session: 20211227040100
HighPow Impedance: 51 Ohm
Implantable Lead Implant Date: 20160308
Implantable Lead Location: 753860
Implantable Lead Model: 295
Implantable Lead Serial Number: 363165
Implantable Pulse Generator Implant Date: 20160308
Lead Channel Impedance Value: 397 Ohm
Lead Channel Pacing Threshold Amplitude: 0.9 V
Lead Channel Pacing Threshold Pulse Width: 0.5 ms
Lead Channel Setting Pacing Amplitude: 2.4 V
Lead Channel Setting Pacing Pulse Width: 0.5 ms
Lead Channel Setting Sensing Sensitivity: 0.6 mV
Pulse Gen Serial Number: 200269

## 2020-09-08 ENCOUNTER — Other Ambulatory Visit: Payer: Self-pay

## 2020-09-08 ENCOUNTER — Encounter: Payer: Self-pay | Admitting: Family Medicine

## 2020-09-08 ENCOUNTER — Ambulatory Visit (INDEPENDENT_AMBULATORY_CARE_PROVIDER_SITE_OTHER): Payer: Medicare Other | Admitting: Family Medicine

## 2020-09-08 VITALS — BP 122/62 | HR 64 | Resp 18 | Wt 249.0 lb

## 2020-09-08 DIAGNOSIS — R972 Elevated prostate specific antigen [PSA]: Secondary | ICD-10-CM

## 2020-09-08 DIAGNOSIS — Z23 Encounter for immunization: Secondary | ICD-10-CM

## 2020-09-08 DIAGNOSIS — E119 Type 2 diabetes mellitus without complications: Secondary | ICD-10-CM | POA: Diagnosis not present

## 2020-09-08 DIAGNOSIS — Z7901 Long term (current) use of anticoagulants: Secondary | ICD-10-CM

## 2020-09-08 DIAGNOSIS — E785 Hyperlipidemia, unspecified: Secondary | ICD-10-CM | POA: Diagnosis not present

## 2020-09-08 DIAGNOSIS — S61401A Unspecified open wound of right hand, initial encounter: Secondary | ICD-10-CM | POA: Diagnosis not present

## 2020-09-08 DIAGNOSIS — Z1211 Encounter for screening for malignant neoplasm of colon: Secondary | ICD-10-CM

## 2020-09-08 DIAGNOSIS — C61 Malignant neoplasm of prostate: Secondary | ICD-10-CM

## 2020-09-08 DIAGNOSIS — I10 Essential (primary) hypertension: Secondary | ICD-10-CM

## 2020-09-08 DIAGNOSIS — Z8673 Personal history of transient ischemic attack (TIA), and cerebral infarction without residual deficits: Secondary | ICD-10-CM | POA: Diagnosis not present

## 2020-09-08 LAB — COMPREHENSIVE METABOLIC PANEL
ALT: 19 U/L (ref 0–53)
AST: 14 U/L (ref 0–37)
Albumin: 4.4 g/dL (ref 3.5–5.2)
Alkaline Phosphatase: 77 U/L (ref 39–117)
BUN: 13 mg/dL (ref 6–23)
CO2: 25 mEq/L (ref 19–32)
Calcium: 9.2 mg/dL (ref 8.4–10.5)
Chloride: 102 mEq/L (ref 96–112)
Creatinine, Ser: 1.01 mg/dL (ref 0.40–1.50)
GFR: 73.94 mL/min (ref >60.00)
Glucose, Bld: 147 mg/dL — ABNORMAL HIGH (ref 70–99)
Potassium: 4.5 mEq/L (ref 3.5–5.1)
Sodium: 140 mEq/L (ref 135–145)
Total Bilirubin: 0.5 mg/dL (ref 0.2–1.2)
Total Protein: 7 g/dL (ref 6.0–8.3)

## 2020-09-08 LAB — LIPID PANEL
Cholesterol: 147 mg/dL (ref 0–200)
HDL: 31.8 mg/dL — ABNORMAL LOW (ref >39.00)
NonHDL: 115.64
Total CHOL/HDL Ratio: 5
Triglycerides: 305 mg/dL — ABNORMAL HIGH (ref 0.0–149.0)
VLDL: 61 mg/dL — ABNORMAL HIGH (ref 0.0–40.0)

## 2020-09-08 LAB — CBC
HCT: 39.8 % (ref 39.0–52.0)
Hemoglobin: 13.4 g/dL (ref 13.0–17.0)
MCHC: 33.8 g/dL (ref 30.0–36.0)
MCV: 87.9 fl (ref 78.0–100.0)
Platelets: 223 10*3/uL (ref 150.0–400.0)
RBC: 4.53 Mil/uL (ref 4.22–5.81)
RDW: 14.4 % (ref 11.5–15.5)
WBC: 6.3 10*3/uL (ref 4.0–10.5)

## 2020-09-08 LAB — MICROALBUMIN / CREATININE URINE RATIO
Creatinine,U: 192.4 mg/dL
Microalb Creat Ratio: 2.7 mg/g (ref 0.0–30.0)
Microalb, Ur: 5.2 mg/dL — ABNORMAL HIGH (ref 0.0–1.9)

## 2020-09-08 LAB — PSA: PSA: 0.01 ng/mL — ABNORMAL LOW (ref 0.10–4.00)

## 2020-09-08 LAB — LDL CHOLESTEROL, DIRECT: Direct LDL: 63 mg/dL

## 2020-09-08 LAB — HEMOGLOBIN A1C: Hgb A1c MFr Bld: 6.5 % (ref 4.6–6.5)

## 2020-09-08 MED ORDER — AMLODIPINE BESYLATE 10 MG PO TABS
10.0000 mg | ORAL_TABLET | Freq: Every day | ORAL | 3 refills | Status: DC
Start: 2020-09-08 — End: 2021-09-12

## 2020-09-08 MED ORDER — CARVEDILOL 25 MG PO TABS: ORAL_TABLET | ORAL | 3 refills | Status: DC

## 2020-09-08 MED ORDER — ROSUVASTATIN CALCIUM 10 MG PO TABS: 10.0000 mg | ORAL_TABLET | Freq: Every day | ORAL | 3 refills | Status: DC

## 2020-09-08 MED ORDER — METFORMIN HCL 500 MG PO TABS: 500.0000 mg | ORAL_TABLET | Freq: Two times a day (BID) | ORAL | 3 refills | Status: DC

## 2020-09-08 NOTE — Patient Instructions (Addendum)
It was good to see you again today!  I will be in touch with your labs asap  Please schedule and eye exam and call to set up a colonoscopy appt soon- [facility] Phone: [phone]  You got your flu shot and tetanus booster today Please consider getting the new shingles vaccine- Shingrix- at your pharmacy at your convenience    Health Maintenance After Age 73 After age 58, you are at a higher risk for certain long-term diseases and infections as well as injuries from falls. Falls are a major cause of broken bones and head injuries in people who are older than age 20. Getting regular preventive care can help to keep you healthy and well. Preventive care includes getting regular testing and making lifestyle changes as recommended by your health care provider. Talk with your health care provider about:  Which screenings and tests you should have. A screening is a test that checks for a disease when you have no symptoms.  A diet and exercise plan that is right for you. What should I know about screenings and tests to prevent falls? Screening and testing are the best ways to find a health problem early. Early diagnosis and treatment give you the best chance of managing medical conditions that are common after age 23. Certain conditions and lifestyle choices may make you more likely to have a fall. Your health care provider may recommend:  Regular vision checks. Poor vision and conditions such as cataracts can make you more likely to have a fall. If you wear glasses, make sure to get your prescription updated if your vision changes.  Medicine review. Work with your health care provider to regularly review all of the medicines you are taking, including over-the-counter medicines. Ask your health care provider about any side effects that may make you more likely to have a fall. Tell your health care provider if any medicines that you take make you feel dizzy or sleepy.  Osteoporosis screening.  Osteoporosis is a condition that causes the bones to get weaker. This can make the bones weak and cause them to break more easily.  Blood pressure screening. Blood pressure changes and medicines to control blood pressure can make you feel dizzy.  Strength and balance checks. Your health care provider may recommend certain tests to check your strength and balance while standing, walking, or changing positions.  Foot health exam. Foot pain and numbness, as well as not wearing proper footwear, can make you more likely to have a fall.  Depression screening. You may be more likely to have a fall if you have a fear of falling, feel emotionally low, or feel unable to do activities that you used to do.  Alcohol use screening. Using too much alcohol can affect your balance and may make you more likely to have a fall. What actions can I take to lower my risk of falls? General instructions  Talk with your health care provider about your risks for falling. Tell your health care provider if: ? You fall. Be sure to tell your health care provider about all falls, even ones that seem minor. ? You feel dizzy, sleepy, or off-balance.  Take over-the-counter and prescription medicines only as told by your health care provider. These include any supplements.  Eat a healthy diet and maintain a healthy weight. A healthy diet includes low-fat dairy products, low-fat (lean) meats, and fiber from whole grains, beans, and lots of fruits and vegetables. Home safety  Remove any tripping hazards,  such as rugs, cords, and clutter.  Install safety equipment such as grab bars in bathrooms and safety rails on stairs.  Keep rooms and walkways well-lit. Activity   Follow a regular exercise program to stay fit. This will help you maintain your balance. Ask your health care provider what types of exercise are appropriate for you.  If you need a cane or walker, use it as recommended by your health care provider.  Wear  supportive shoes that have nonskid soles. Lifestyle  Do not drink alcohol if your health care provider tells you not to drink.  If you drink alcohol, limit how much you have: ? 0-1 drink a day for women. ? 0-2 drinks a day for men.  Be aware of how much alcohol is in your drink. In the [place] one drink equals one typical bottle of beer (12 oz), one-half glass of wine (5 oz), or one shot of hard liquor (1 oz).  Do not use any products that contain nicotine or tobacco, such as cigarettes and e-cigarettes. If you need help quitting, ask your health care provider. Summary  Having a healthy lifestyle and getting preventive care can help to protect your health and wellness after age 29.  Screening and testing are the best way to find a health problem early and help you avoid having a fall. Early diagnosis and treatment give you the best chance for managing medical conditions that are more common for people who are older than age 9.  Falls are a major cause of broken bones and head injuries in people who are older than age 31. Take precautions to prevent a fall at home.  Work with your health care provider to learn what changes you can make to improve your health and wellness and to prevent falls. This information is not intended to replace advice given to you by your health care provider. Make sure you discuss any questions you have with your health care provider. Document Revised: 12/19/2018 Document Reviewed: 07/11/2017 Elsevier Patient Education  2020 [employer].

## 2020-09-17 NOTE — Progress Notes (Signed)
Remote ICD transmission.   

## 2020-10-18 ENCOUNTER — Other Ambulatory Visit: Payer: Self-pay

## 2020-10-18 ENCOUNTER — Encounter: Payer: Self-pay | Admitting: Cardiology

## 2020-10-18 ENCOUNTER — Ambulatory Visit (INDEPENDENT_AMBULATORY_CARE_PROVIDER_SITE_OTHER): Payer: Medicare Other | Admitting: Cardiology

## 2020-10-18 VITALS — BP 130/80 | HR 66 | Ht 72.0 in | Wt 248.0 lb

## 2020-10-18 DIAGNOSIS — I429 Cardiomyopathy, unspecified: Secondary | ICD-10-CM

## 2020-10-18 DIAGNOSIS — I480 Paroxysmal atrial fibrillation: Secondary | ICD-10-CM

## 2020-10-18 DIAGNOSIS — Z95810 Presence of automatic (implantable) cardiac defibrillator: Secondary | ICD-10-CM

## 2020-10-18 NOTE — Progress Notes (Signed)
Electrophysiology Office Note   Date:  10/18/2020   ID:  Bobby Bradley, DOB December 09, 1945, MRN [id number]  PCP:  Bobby Mclean, MD  Cardiologist:  Bobby Bradley Primary Electrophysiologist:  Bobby Bradley Bobby Leeds, MD    No chief complaint on file.    History of Present Illness: Bobby Bradley is a 75 y.o. male who is being seen today for the evaluation of CHF at the request of Bobby Bradley. Presenting today for electrophysiology evaluation.    He has a history significant for atrial fibrillation, nonischemic cardiomyopathy, hypertension, hyperlipidemia, and TIAs.  He is status post [place].  Today, denies symptoms of palpitations, chest pain, shortness of breath, orthopnea, PND, lower extremity edema, claudication, dizziness, presyncope, syncope, bleeding, or neurologic sequela. The patient is tolerating medications without difficulties.  Since last being seen he has done well.  He has no chest pain or shortness of breath.  He is able do all of his daily activities without restriction.   Past Medical History:  Diagnosis Date  . A-fib ([place])   . CHF (congestive heart failure) ([place])   . Essential hypertension   . Hyperlipidemia   . Non-ischemic cardiomyopathy ([place])   . Presence of combination internal cardiac defibrillator (ICD) and pacemaker   . Prostate cancer ([place])   . TIA (transient ischemic attack)    Past Surgical History:  Procedure Laterality Date  . BACK SURGERY    . COLONOSCOPY     about 10 years. Done in [place] or [place]   . PROSTATECTOMY       Current Outpatient Medications  Medication Sig Dispense Refill  . amLODipine (NORVASC) 10 MG tablet Take 1 tablet (10 mg total) by mouth daily. 90 tablet 3  . carvedilol (COREG) 25 MG tablet Take one by mouth BID 180 tablet 3  . cyclobenzaprine (FLEXERIL) 10 MG tablet Take 1 tablet (10 mg total) by mouth 2 (two) times daily as needed for muscle spasms. 30 tablet 0  . ELIQUIS 5 MG TABS  tablet TAKE 1 TABLET(5 MG) BY MOUTH TWICE DAILY 180 tablet 0  . fluticasone (FLONASE) 50 MCG/ACT nasal spray Place 2 sprays into both nostrils daily. (Patient taking differently: Place 2 sprays into both nostrils as needed.) 16 g 6  . losartan (COZAAR) 100 MG tablet TAKE 1 TABLET(100 MG) BY MOUTH DAILY 90 tablet 2  . metFORMIN (GLUCOPHAGE) 500 MG tablet Take 1 tablet (500 mg total) by mouth 2 (two) times daily with a meal. 180 tablet 3  . Multiple Vitamin (MULTI VITAMIN DAILY PO) Take 1 tablet by mouth daily.    . rosuvastatin (CRESTOR) 10 MG tablet Take 1 tablet (10 mg total) by mouth daily. 90 tablet 3  . spironolactone (ALDACTONE) 25 MG tablet TAKE 1 TABLET(25 MG) BY MOUTH DAILY 90 tablet 3   No current facility-administered medications for this visit.    Allergies:   Lisinopril   Social History:  The patient  reports that he quit smoking about 37 years ago. He has never used smokeless tobacco. He reports current alcohol use. He reports that he does not use drugs.   Family History:  The patient's family history includes Atrial fibrillation in his sister; Lung cancer in his brother, father, and mother; Uterine cancer in his mother.   ROS:  Please see the history of present illness.   Otherwise, review of systems is positive for none.   All other systems are reviewed and negative.   PHYSICAL EXAM: VS:  BP 130/80  Pulse 66   Ht 6' (1.829 m)   Wt 248 lb (112.5 kg)   SpO2 95%   BMI 33.63 kg/m  , BMI Body mass index is 33.63 kg/m. GEN: Well nourished, well developed, in no acute distress  HEENT: normal  Neck: no JVD, carotid bruits, or masses Cardiac: RRR; no murmurs, rubs, or gallops,no edema  Respiratory:  clear to auscultation bilaterally, normal work of breathing GI: soft, nontender, nondistended, + BS MS: no deformity or atrophy  Skin: warm and dry, device site well healed Neuro:  Strength and sensation are intact Psych: euthymic mood, full affect  EKG:  EKG is ordered  today. Personal review of the ekg ordered shows sinus rhythm, PVC  Personal review of the device interrogation today. Results in [place]: 09/08/2020: ALT 19; BUN 13; Creatinine, Ser 1.01; Hemoglobin 13.4; Platelets 223.0; Potassium 4.5; Sodium 140    Lipid Panel     Component Value Date/Time   CHOL 147 09/08/2020 0921   TRIG 305.0 (H) 09/08/2020 0921   HDL 31.80 (L) 09/08/2020 0921   CHOLHDL 5 09/08/2020 0921   VLDL 61.0 (H) 09/08/2020 0921   LDLDIRECT 63.0 09/08/2020 0921     Wt Readings from Last 3 Encounters:  10/18/20 248 lb (112.5 kg)  09/08/20 249 lb (112.9 kg)  09/09/19 250 lb (113.4 kg)      Other studies Reviewed: Additional studies/ records that were reviewed today include: TTE 01/10/18  Review of the above records today demonstrates:  - Left ventricle: The cavity size was normal. Wall thickness was   increased in a pattern of moderate LVH. Systolic function was   mildly reduced. The estimated ejection fraction was in the range   of 45% to 50%. Diffuse hypokinesis. Doppler parameters are   consistent with abnormal left ventricular relaxation (grade 1   diastolic dysfunction). - Mitral valve: Calcified annulus.   ASSESSMENT AND PLAN:  1.  Paroxysmal atrial fibrillation: Currently on Eliquis.  CHA2DS2-VASc of 3.  He currently feels well.  No further episodes of atrial fibrillation.  2.  Nonischemic cardiomyopathy: Currently on ARB, beta-blocker, Aldactone.  Is status post [place].  Device functioning appropriately.  No changes at this time.    3.  Hypertension: Currently well controlled    Current medicines are reviewed at length with the patient today.   The patient does not have concerns regarding his medicines.  The following changes were made today: None  Labs/ tests ordered today include:  Orders Placed This Encounter  Procedures  . EKG 12-Lead     Disposition:   FU with Bobby Bradley one year  Signed, Bobby Bradley Bobby Leeds, MD  10/18/2020 3:14 PM     [address] [place] [address] [phone] (office) [phone] (fax) acute

## 2020-10-22 NOTE — Progress Notes (Signed)
HPI: FU atrial fibrillation and CHF. Also with prior ICD.Previously moved from [place].Patient apparently had significant ectopy in the past and had catheterization revealing no coronary disease. He later had a TIA and was diagnosed with a cardiomyopathy and paroxysmal atrial fibrillation. Abdominal ultrasound February 2018 showed no aneurysm. Echocardiogram May 2019showed ejection fraction 45-50%, moderateleft ventricular hypertrophy, grade 1diastolic dysfunction.Since last seen  patient notes increased dyspnea on exertion over the past 1 year.  No orthopnea, PND, pedal edema, chest pain or syncope.  Current Outpatient Medications  Medication Sig Dispense Refill  . amLODipine (NORVASC) 10 MG tablet Take 1 tablet (10 mg total) by mouth daily. 90 tablet 3  . carvedilol (COREG) 25 MG tablet Take one by mouth BID 180 tablet 3  . cyclobenzaprine (FLEXERIL) 10 MG tablet Take 1 tablet (10 mg total) by mouth 2 (two) times daily as needed for muscle spasms. 30 tablet 0  . ELIQUIS 5 MG TABS tablet TAKE 1 TABLET(5 MG) BY MOUTH TWICE DAILY 180 tablet 1  . fluticasone (FLONASE) 50 MCG/ACT nasal spray Place 2 sprays into both nostrils daily. (Patient taking differently: Place 2 sprays into both nostrils as needed.) 16 g 6  . losartan (COZAAR) 100 MG tablet TAKE 1 TABLET(100 MG) BY MOUTH DAILY 90 tablet 2  . metFORMIN (GLUCOPHAGE) 500 MG tablet Take 1 tablet (500 mg total) by mouth 2 (two) times daily with a meal. 180 tablet 3  . Multiple Vitamin (MULTI VITAMIN DAILY PO) Take 1 tablet by mouth daily.    . rosuvastatin (CRESTOR) 10 MG tablet Take 1 tablet (10 mg total) by mouth daily. 90 tablet 3  . spironolactone (ALDACTONE) 25 MG tablet TAKE 1 TABLET(25 MG) BY MOUTH DAILY 90 tablet 3   No current facility-administered medications for this visit.     Past Medical History:  Diagnosis Date  . A-fib ([place])   . CHF (congestive heart failure) ([place])   . Essential hypertension   . Hyperlipidemia    . Non-ischemic cardiomyopathy ([place])   . Presence of combination internal cardiac defibrillator (ICD) and pacemaker   . Prostate cancer ([place])   . TIA (transient ischemic attack)     Past Surgical History:  Procedure Laterality Date  . BACK SURGERY    . COLONOSCOPY     about 10 years. Done in [place] or [place]   . PROSTATECTOMY      Social History   Socioeconomic History  . Marital status: Married    Spouse name: Not on file  . Number of children: 7  . Years of education: Not on file  . Highest education level: Not on file  Occupational History  . Occupation: Art therapist   Tobacco Use  . Smoking status: Former Smoker    Quit date: 09/12/1983    Years since quitting: 37.1  . Smokeless tobacco: Never Used  Vaping Use  . Vaping Use: Never used  Substance and Sexual Activity  . Alcohol use: Yes    Comment: very rare  . Drug use: No  . Sexual activity: Not on file  Other Topics Concern  . Not on file  Social History Narrative  . Not on file   Social Determinants of Health   Financial Resource Strain: Not on file  Food Insecurity: Not on file  Transportation Needs: Not on file  Physical Activity: Not on file  Stress: Not on file  Social Connections: Not on file  Intimate Partner Violence: Not on file  Family History  Problem Relation Age of Onset  . Lung cancer Mother   . Uterine cancer Mother   . Lung cancer Father   . Lung cancer Brother   . Atrial fibrillation Sister   . Colon cancer Neg Hx   . Esophageal cancer Neg Hx     ROS: no fevers or chills, productive cough, hemoptysis, dysphasia, odynophagia, melena, hematochezia, dysuria, hematuria, rash, seizure activity, orthopnea, PND, pedal edema, claudication. Remaining systems are negative.  Physical Exam: Well-developed well-nourished in no acute distress.  Skin is warm and dry.  HEENT is normal.  Neck is supple.  Chest is clear to auscultation with normal expansion.   Cardiovascular exam is regular rate and rhythm.  Abdominal exam nontender or distended. No masses palpated. Extremities show no edema. neuro grossly intact  ECG-October 18, 2020-sinus rhythm with occasional PVC and first-degree AV block.  Personally reviewed  A/P  1 paroxysmal atrial fibrillation-patient remains in sinus rhythm on exam.  Continue apixaban and beta-blocker.  2 hypertension-patient's blood pressure is controlled today.  Continue present medical regimen.  3 nonischemic cardiomyopathy-LV function has improved on most recent echocardiogram.  We will continue medical therapy with beta-blocker, ARB and spironolactone.    4 prior ICD-Per electrophysiology.  5 dyspnea-etiology unclear.  He feels that deconditioning may be contributing.  I will check a BNP and potentially increase diuretic if elevated though he does not appear to be significantly volume overloaded on exam.  Repeat echocardiogram.  I doubt pulmonary embolus given that he is on apixaban.  If symptoms do not improve may need pulmonary evaluation and possible rule out obstructive sleep apnea.  Kirk Ruths, MD

## 2020-10-29 ENCOUNTER — Other Ambulatory Visit: Payer: Self-pay | Admitting: Cardiology

## 2020-10-29 NOTE — Telephone Encounter (Signed)
Prescription refill request for Eliquis received. Indication:atrial fibrillation Last office visit:2/22  camnitz Scr:1.01  12/21 Age: 75 Weight:112.5 kg  Prescription refilled

## 2020-11-03 ENCOUNTER — Other Ambulatory Visit: Payer: Self-pay

## 2020-11-03 ENCOUNTER — Encounter: Payer: Self-pay | Admitting: Cardiology

## 2020-11-03 ENCOUNTER — Ambulatory Visit (INDEPENDENT_AMBULATORY_CARE_PROVIDER_SITE_OTHER): Payer: Medicare Other | Admitting: Cardiology

## 2020-11-03 VITALS — BP 132/76 | HR 64 | Ht 72.0 in | Wt 248.1 lb

## 2020-11-03 DIAGNOSIS — R0600 Dyspnea, unspecified: Secondary | ICD-10-CM | POA: Diagnosis not present

## 2020-11-03 DIAGNOSIS — I428 Other cardiomyopathies: Secondary | ICD-10-CM

## 2020-11-03 DIAGNOSIS — I480 Paroxysmal atrial fibrillation: Secondary | ICD-10-CM

## 2020-11-03 DIAGNOSIS — I5022 Chronic systolic (congestive) heart failure: Secondary | ICD-10-CM

## 2020-11-03 DIAGNOSIS — R0609 Other forms of dyspnea: Secondary | ICD-10-CM

## 2020-11-03 DIAGNOSIS — I10 Essential (primary) hypertension: Secondary | ICD-10-CM

## 2020-11-03 NOTE — Patient Instructions (Signed)
  Lab Work:  Your physician recommends that you HAVE LAB WORK TODAY  If you have labs (blood work) drawn today and your tests are completely normal, you will receive your results only by: Marland Kitchen MyChart Message (if you have MyChart) OR . A paper copy in the mail If you have any lab test that is abnormal or we need to change your treatment, we will call you to review the results.   Testing/Procedures:  Your physician has requested that you have an echocardiogram. Echocardiography is a painless test that uses sound waves to create images of your heart. It provides your doctor with information about the size and shape of your heart and how well your heart's chambers and valves are working. This procedure takes approximately one hour. There are no restrictions for this procedure.[facility]-1ST FLOOR IMAGING DEPARTMENT     Follow-Up: At [facility], you and your health needs are our priority.  As part of our continuing mission to provide you with exceptional heart care, we have created designated Provider Care Teams.  These Care Teams include your primary Cardiologist (physician) and Advanced Practice Providers (APPs -  Physician Assistants and Nurse Practitioners) who all work together to provide you with the care you need, when you need it.  We recommend signing up for the patient portal called "MyChart".  Sign up information is provided on this After Visit Summary.  MyChart is used to connect with patients for Virtual Visits (Telemedicine).  Patients are able to view lab/test results, encounter notes, upcoming appointments, etc.  Non-urgent messages can be sent to your provider as well.   To learn more about what you can do with MyChart, go to [email or web address].    Your next appointment:   6 month(s)  The format for your next appointment:   In Person  Provider:   Kirk Ruths, MD

## 2020-11-04 LAB — PRO B NATRIURETIC PEPTIDE: NT-Pro BNP: 233 pg/mL (ref 0–376)

## 2020-11-09 ENCOUNTER — Encounter: Payer: Self-pay | Admitting: *Deleted

## 2020-11-16 ENCOUNTER — Other Ambulatory Visit: Payer: Self-pay | Admitting: Cardiology

## 2020-11-16 DIAGNOSIS — I10 Essential (primary) hypertension: Secondary | ICD-10-CM

## 2020-12-03 ENCOUNTER — Other Ambulatory Visit: Payer: Self-pay

## 2020-12-03 ENCOUNTER — Ambulatory Visit (HOSPITAL_BASED_OUTPATIENT_CLINIC_OR_DEPARTMENT_OTHER)
Admission: RE | Admit: 2020-12-03 | Discharge: 2020-12-03 | Disposition: A | Discharge status: Home / Self Care | Payer: Medicare Other | Source: Ambulatory Visit | Attending: Cardiology | Admitting: Cardiology

## 2020-12-03 DIAGNOSIS — I428 Other cardiomyopathies: Secondary | ICD-10-CM | POA: Insufficient documentation

## 2020-12-03 LAB — ECHOCARDIOGRAM COMPLETE
Area-P 1/2: 3.6 cm2
S' Lateral: 4.38 cm
Single Plane A4C EF: 40.4 %

## 2020-12-06 ENCOUNTER — Ambulatory Visit (INDEPENDENT_AMBULATORY_CARE_PROVIDER_SITE_OTHER): Payer: Medicare Other

## 2020-12-06 DIAGNOSIS — I429 Cardiomyopathy, unspecified: Secondary | ICD-10-CM | POA: Diagnosis not present

## 2020-12-06 LAB — CUP PACEART REMOTE DEVICE CHECK
Battery Remaining Longevity: 126 mo
Battery Remaining Percentage: 100 %
Brady Statistic RV Percent Paced: 11 %
Date Time Interrogation Session: 20220328041300
HighPow Impedance: 48 Ohm
Implantable Lead Implant Date: 20160308
Implantable Lead Location: 753860
Implantable Lead Model: 295
Implantable Lead Serial Number: 363165
Implantable Pulse Generator Implant Date: 20160308
Lead Channel Impedance Value: 378 Ohm
Lead Channel Pacing Threshold Amplitude: 0.8 V
Lead Channel Pacing Threshold Pulse Width: 0.5 ms
Lead Channel Setting Pacing Amplitude: 2.4 V
Lead Channel Setting Pacing Pulse Width: 0.5 ms
Lead Channel Setting Sensing Sensitivity: 0.6 mV
Pulse Gen Serial Number: 200269

## 2020-12-20 NOTE — Progress Notes (Signed)
Remote ICD transmission.   

## 2021-01-25 NOTE — Progress Notes (Addendum)
[facility] [place], [place], [place] [address] [phone]  Date:  01/27/2021   Name:  Bobby Bradley   DOB:  [birth year]   MRN:  [id number]  PCP:  Darreld Mclean, MD    Chief Complaint: Diarrhea (One month, trouble holding in bowls, no abdominal pain, some nausea, no vomiting, no fever)   History of Present Illness:  Bobby Bradley is a 75 y.o. very pleasant male patient who presents with the following:  Patient seen today with concern of diarrhea Most recent visit with myself in December History of well-controlled diabetes, cardiomyopathy, TIA, A. fib on anticoagulation Eliquis, CHF, ICD in place, hypertension, prostate cancer s/p prostatectomy Most recent visit with electrophysiology, Dr. Curt Bradley in February He was also seen by his general cardiologist, Dr. Stanford Bradley in February  Eye exam- reminded pt  Colon cancer screening COVID-19 booster Can update A1c today Lab Results  Component Value Date   HGBA1C 6.5 09/08/2020   Amlodipine Carvedilol Eliquis Spironolactone Losartan Metformin Crestor  About 4 weeks ago his adult grandchild was sick with a diarrheal illness.  Bobby Bradley spent time with him, he seemed to come down with the same illness.  At first it was fairly severe, he was having diarrhea up to 10 times a day.  The symptoms have waxed and waned over the last several weeks.  His wife had him start on some iron supplementation over the last few days, and his stools actually have been close to normal since starting on iron  He notes he has lost a few pounds, he has been eating less due to diarrhea  No fever, chills, vomiting, or abdominal pain  He is not aware of any suspicious or unusual foods, no camping or international travel He has not noted any association with certain foods such as dairy Patient Active Problem List   Diagnosis Date Noted  . Bronchitis 08/17/2018  . Controlled type 2  diabetes mellitus without complication, without long-term current use of insulin ([place]) 02/06/2018  . Cardiomyopathy ([place]) 04/16/2017  . History of TIA (transient ischemic attack) 04/16/2017  . Chronic anticoagulation 04/16/2017  . Paroxysmal atrial fibrillation ([place]) 10/11/2016  . Chronic systolic congestive heart failure ([place]) 10/11/2016  . ICD (implantable cardioverter-defibrillator) in place 10/11/2016  . History of prostate cancer 10/11/2016  . Essential hypertension 10/11/2016    Past Medical History:  Diagnosis Date  . A-fib ([place])   . CHF (congestive heart failure) ([place])   . Essential hypertension   . Hyperlipidemia   . Non-ischemic cardiomyopathy ([place])   . Presence of combination internal cardiac defibrillator (ICD) and pacemaker   . Prostate cancer ([place])   . TIA (transient ischemic attack)     Past Surgical History:  Procedure Laterality Date  . BACK SURGERY    . COLONOSCOPY     about 10 years. Done in [place] or [place]   . PROSTATECTOMY      Social History   Tobacco Use  . Smoking status: Former Smoker    Quit date: 09/12/1983    Years since quitting: 37.4  . Smokeless tobacco: Never Used  Vaping Use  . Vaping Use: Never used  Substance Use Topics  . Alcohol use: Yes    Comment: very rare  . Drug use: No    Family History  Problem Relation Age of Onset  . Lung cancer Mother   . Uterine cancer Mother   . Lung cancer Father   .  Lung cancer Brother   . Atrial fibrillation Sister   . Colon cancer Neg Hx   . Esophageal cancer Neg Hx     Allergies  Allergen Reactions  . Lisinopril     Extreme diarrhea    Medication list has been reviewed and updated.  Current Outpatient Medications on File Prior to Visit  Medication Sig Dispense Refill  . amLODipine (NORVASC) 10 MG tablet Take 1 tablet (10 mg total) by mouth daily. 90 tablet 3  . carvedilol (COREG) 25 MG tablet TAKE 1 TABLET(25 MG) BY MOUTH TWICE DAILY 60 tablet 3  . ELIQUIS 5  MG TABS tablet TAKE 1 TABLET(5 MG) BY MOUTH TWICE DAILY 180 tablet 1  . fluticasone (FLONASE) 50 MCG/ACT nasal spray Place 2 sprays into both nostrils daily. (Patient taking differently: Place 2 sprays into both nostrils as needed.) 16 g 6  . losartan (COZAAR) 100 MG tablet TAKE 1 TABLET(100 MG) BY MOUTH DAILY 90 tablet 2  . metFORMIN (GLUCOPHAGE) 500 MG tablet Take 1 tablet (500 mg total) by mouth 2 (two) times daily with a meal. 180 tablet 3  . Multiple Vitamin (MULTI VITAMIN DAILY PO) Take 1 tablet by mouth daily.    . rosuvastatin (CRESTOR) 10 MG tablet Take 1 tablet (10 mg total) by mouth daily. 90 tablet 3  . spironolactone (ALDACTONE) 25 MG tablet TAKE 1 TABLET(25 MG) BY MOUTH DAILY 90 tablet 3   No current facility-administered medications on file prior to visit.    Review of Systems:  As per HPI- otherwise negative.  Wt Readings from Last 3 Encounters:  01/27/21 242 lb (109.8 kg)  11/03/20 248 lb 1.3 oz (112.5 kg)  10/18/20 248 lb (112.5 kg)      Physical Examination: Vitals:   01/27/21 1526  BP: 132/82  Pulse: (!) 56  Resp: 17  SpO2: 97%   Vitals:   01/27/21 1526  Weight: 242 lb (109.8 kg)  Height: 6' (1.829 m)   Body mass index is 32.82 kg/m. Ideal Body Weight: Weight in (lb) to have BMI = 25: 183.9  GEN: no acute distress.  Obese, looks well HEENT: Atraumatic, Normocephalic.  Ears and Nose: No external deformity. CV: RRR, No M/G/R. No JVD. No thrill. No extra heart sounds. PULM: CTA B, no wheezes, crackles, rhonchi. No retractions. No resp. distress. No accessory muscle use. ABD: S, NT, ND, +BS. No rebound. No HSM.  Belly is benign EXTR: No c/c/e PSYCH: Normally interactive. Conversant.    Assessment and Plan: Diarrhea of presumed infectious origin - Plan: Stool Culture, Clostridium Difficile by PCR(Labcorp/Sunquest), CANCELED: Clostridium Difficile by PCR(Labcorp/Sunquest), CANCELED: Stool Culture  Dyslipidemia - Plan: Lipid panel  Controlled type 2  diabetes mellitus without complication, without long-term current use of insulin (HCC) - Plan: Comprehensive metabolic panel, Hemoglobin A1c  Essential hypertension - Plan: CBC, Comprehensive metabolic panel  Here today with concern of diarrhea, suspect he caught a viral enteritis from his grandson which has taken some time to clear up.  We will certainly check for C. difficile or any other bacterial etiology.  Right now his stools seem better, but he is taking iron.  I asked him to stop the iron supplement and see if diarrhea returns.  Overdue for eye exam  Will plan further follow- up pending labs.  This visit occurred during the SARS-CoV-2 public health emergency.  Safety protocols were in place, including screening questions prior to the visit, additional usage of staff PPE, and extensive cleaning of exam room while observing  appropriate contact time as indicated for disinfecting solutions.    Signed Bobby Blinks, MD  Addendum 5/20, received his labs as below Message to patient  Results for orders placed or performed in visit on 01/27/21  CBC  Result Value Ref Range   WBC 6.9 4.0 - 10.5 K/uL   RBC 4.09 (L) 4.22 - 5.81 Mil/uL   Platelets 328.0 150.0 - 400.0 K/uL   Hemoglobin 12.1 (L) 13.0 - 17.0 g/dL   HCT 35.7 (L) 39.0 - 52.0 %   MCV 87.2 78.0 - 100.0 fl   MCHC 34.0 30.0 - 36.0 g/dL   RDW 13.6 11.5 - 15.5 %  Comprehensive metabolic panel  Result Value Ref Range   Sodium 138 135 - 145 mEq/L   Potassium 4.3 3.5 - 5.1 mEq/L   Chloride 102 96 - 112 mEq/L   CO2 25 19 - 32 mEq/L   Glucose, Bld 147 (H) 70 - 99 mg/dL   BUN 15 6 - 23 mg/dL   Creatinine, Ser 1.00 0.40 - 1.50 mg/dL   Total Bilirubin 0.5 0.2 - 1.2 mg/dL   Alkaline Phosphatase 79 39 - 117 U/L   AST 11 0 - 37 U/L   ALT 16 0 - 53 U/L   Total Protein 6.9 6.0 - 8.3 g/dL   Albumin 3.9 3.5 - 5.2 g/dL   GFR 74.62 >60.00 mL/min   Calcium 9.5 8.4 - 10.5 mg/dL  Hemoglobin A1c  Result Value Ref Range   Hgb A1c MFr Bld  6.7 (H) 4.6 - 6.5 %  Lipid panel  Result Value Ref Range   Cholesterol 98 0 - 200 mg/dL   Triglycerides 184.0 (H) 0.0 - 149.0 mg/dL   HDL 28.00 (L) >39.00 mg/dL   VLDL 36.8 0.0 - 40.0 mg/dL   LDL Cholesterol 33 0 - 99 mg/dL   Total CHOL/HDL Ratio 3    NonHDL 69.70

## 2021-01-27 ENCOUNTER — Ambulatory Visit (INDEPENDENT_AMBULATORY_CARE_PROVIDER_SITE_OTHER): Payer: Medicare Other | Admitting: Family Medicine

## 2021-01-27 ENCOUNTER — Other Ambulatory Visit: Payer: Self-pay

## 2021-01-27 ENCOUNTER — Encounter: Payer: Self-pay | Admitting: Family Medicine

## 2021-01-27 VITALS — BP 132/82 | HR 56 | Resp 17 | Ht 72.0 in | Wt 242.0 lb

## 2021-01-27 DIAGNOSIS — E119 Type 2 diabetes mellitus without complications: Secondary | ICD-10-CM

## 2021-01-27 DIAGNOSIS — E785 Hyperlipidemia, unspecified: Secondary | ICD-10-CM

## 2021-01-27 DIAGNOSIS — D649 Anemia, unspecified: Secondary | ICD-10-CM

## 2021-01-27 DIAGNOSIS — I10 Essential (primary) hypertension: Secondary | ICD-10-CM | POA: Diagnosis not present

## 2021-01-27 DIAGNOSIS — R197 Diarrhea, unspecified: Secondary | ICD-10-CM

## 2021-01-27 NOTE — Patient Instructions (Signed)
Good to see you again today!  I will be in touch with your labs asap Please collect the C diff and bacterial culture stool samples IF diarrhea returns once you come off the iron.  If diarrhea does not return even better!  I think you may be due for an eye exam Please consider covid 19 4th dose if not done already  If you are getting worse or have any other concerns please alert me  Wt Readings from Last 3 Encounters:  01/27/21 242 lb (109.8 kg)  11/03/20 248 lb 1.3 oz (112.5 kg)  10/18/20 248 lb (112.5 kg)

## 2021-01-28 ENCOUNTER — Encounter: Payer: Self-pay | Admitting: Family Medicine

## 2021-01-28 LAB — LIPID PANEL
Cholesterol: 98 mg/dL (ref 0–200)
HDL: 28 mg/dL — ABNORMAL LOW (ref >39.00)
LDL Cholesterol: 33 mg/dL (ref 0–99)
NonHDL: 69.7
Total CHOL/HDL Ratio: 3
Triglycerides: 184 mg/dL — ABNORMAL HIGH (ref 0.0–149.0)
VLDL: 36.8 mg/dL (ref 0.0–40.0)

## 2021-01-28 LAB — COMPREHENSIVE METABOLIC PANEL
ALT: 16 U/L (ref 0–53)
AST: 11 U/L (ref 0–37)
Albumin: 3.9 g/dL (ref 3.5–5.2)
Alkaline Phosphatase: 79 U/L (ref 39–117)
BUN: 15 mg/dL (ref 6–23)
CO2: 25 mEq/L (ref 19–32)
Calcium: 9.5 mg/dL (ref 8.4–10.5)
Chloride: 102 mEq/L (ref 96–112)
Creatinine, Ser: 1 mg/dL (ref 0.40–1.50)
GFR: 74.62 mL/min (ref >60.00)
Glucose, Bld: 147 mg/dL — ABNORMAL HIGH (ref 70–99)
Potassium: 4.3 mEq/L (ref 3.5–5.1)
Sodium: 138 mEq/L (ref 135–145)
Total Bilirubin: 0.5 mg/dL (ref 0.2–1.2)
Total Protein: 6.9 g/dL (ref 6.0–8.3)

## 2021-01-28 LAB — CBC
HCT: 35.7 % — ABNORMAL LOW (ref 39.0–52.0)
Hemoglobin: 12.1 g/dL — ABNORMAL LOW (ref 13.0–17.0)
MCHC: 34 g/dL (ref 30.0–36.0)
MCV: 87.2 fl (ref 78.0–100.0)
Platelets: 328 10*3/uL (ref 150.0–400.0)
RBC: 4.09 Mil/uL — ABNORMAL LOW (ref 4.22–5.81)
RDW: 13.6 % (ref 11.5–15.5)
WBC: 6.9 10*3/uL (ref 4.0–10.5)

## 2021-01-28 LAB — HEMOGLOBIN A1C: Hgb A1c MFr Bld: 6.7 % — ABNORMAL HIGH (ref 4.6–6.5)

## 2021-01-28 NOTE — Addendum Note (Signed)
Addended by: Lamar Blinks C on: 01/28/2021 12:53 PM   Modules accepted: Orders

## 2021-02-10 ENCOUNTER — Other Ambulatory Visit: Payer: Self-pay

## 2021-02-10 ENCOUNTER — Other Ambulatory Visit: Payer: Medicare Other

## 2021-02-10 ENCOUNTER — Ambulatory Visit: Payer: Medicare Other | Attending: Internal Medicine

## 2021-02-10 DIAGNOSIS — Z23 Encounter for immunization: Secondary | ICD-10-CM

## 2021-02-10 NOTE — Progress Notes (Signed)
   Covid-19 Vaccination Clinic  Name:  Bobby Bradley    MRN: [id number] DOB: 12-21-1945  02/10/2021  [name] was observed post Covid-19 immunization for 15 minutes without incident. He was provided with Vaccine Information Sheet and instruction to access the V-Safe system.   [name] was instructed to call 911 with any severe reactions post vaccine: Marland Kitchen Difficulty breathing  . Swelling of face and throat  . A fast heartbeat  . A bad rash all over body  . Dizziness and weakness   Immunizations Administered    Name Date Dose VIS Date Route   PFIZER Comrnaty(Gray TOP) Covid-19 Vaccine 02/10/2021  2:27 PM 0.3 mL 08/19/2020 Intramuscular   Manufacturer: [place]   Lot: [id number]   NDC: [phone]

## 2021-02-10 NOTE — Addendum Note (Signed)
Addended by: Kelle Darting A on: 02/10/2021 03:49 PM   Modules accepted: Orders

## 2021-02-11 ENCOUNTER — Telehealth: Payer: Self-pay

## 2021-02-11 ENCOUNTER — Encounter: Payer: Self-pay | Admitting: Family Medicine

## 2021-02-11 ENCOUNTER — Other Ambulatory Visit (INDEPENDENT_AMBULATORY_CARE_PROVIDER_SITE_OTHER): Payer: Medicare Other

## 2021-02-11 DIAGNOSIS — D649 Anemia, unspecified: Secondary | ICD-10-CM | POA: Diagnosis not present

## 2021-02-11 DIAGNOSIS — R195 Other fecal abnormalities: Secondary | ICD-10-CM

## 2021-02-11 LAB — FECAL OCCULT BLOOD, IMMUNOCHEMICAL: Fecal Occult Bld: POSITIVE — AB

## 2021-02-11 NOTE — Telephone Encounter (Signed)
Positive Ifob  Caller: Earnest Bailey Receiver: Bobby Bradley  Date and time: 02/11/2021 at 3:33 pm

## 2021-02-11 NOTE — Telephone Encounter (Signed)
Please refer pt to GI, he was originally scheduled to see Dr. Lyndel Safe, and inform him to reach out if he does not hear anything in the next week or so. Ty.

## 2021-02-11 NOTE — Telephone Encounter (Signed)
ERROR

## 2021-02-14 ENCOUNTER — Other Ambulatory Visit (HOSPITAL_BASED_OUTPATIENT_CLINIC_OR_DEPARTMENT_OTHER): Payer: Self-pay

## 2021-02-14 DIAGNOSIS — Z23 Encounter for immunization: Secondary | ICD-10-CM | POA: Diagnosis not present

## 2021-02-14 MED ORDER — PFIZER-BIONT COVID-19 VAC-TRIS 30 MCG/0.3ML IM SUSP
INTRAMUSCULAR | 0 refills | Status: DC
  Filled 2021-02-14: qty 0.3, 1d supply, fill #0

## 2021-02-14 NOTE — Telephone Encounter (Signed)
GI referral placed by PCP.

## 2021-02-24 ENCOUNTER — Encounter: Payer: Self-pay | Admitting: Family Medicine

## 2021-02-25 ENCOUNTER — Telehealth: Payer: Self-pay | Admitting: Gastroenterology

## 2021-02-25 NOTE — Telephone Encounter (Signed)
Good morning Dr. Lyndel Safe, patient saw you in 2020 but is now wanting to change to Dr. Havery Moros who is his wife's doctor.  Ok to transfer?  Please advise.

## 2021-02-28 NOTE — Telephone Encounter (Signed)
Thank you both.  Called patient and left voice mail to return call to schedule.

## 2021-02-28 NOTE — Telephone Encounter (Signed)
Absolutely He will be an excellent hands RG

## 2021-02-28 NOTE — Telephone Encounter (Signed)
Dr. Havery Moros, ok with you?

## 2021-02-28 NOTE — Telephone Encounter (Signed)
Okay with me if okay with Dr. Lyndel Safe. Thanks

## 2021-03-04 ENCOUNTER — Other Ambulatory Visit: Payer: Self-pay | Admitting: Cardiology

## 2021-03-07 ENCOUNTER — Ambulatory Visit (INDEPENDENT_AMBULATORY_CARE_PROVIDER_SITE_OTHER): Payer: Medicare Other

## 2021-03-07 DIAGNOSIS — I429 Cardiomyopathy, unspecified: Secondary | ICD-10-CM | POA: Diagnosis not present

## 2021-03-08 LAB — CUP PACEART REMOTE DEVICE CHECK
Battery Remaining Longevity: 126 mo
Battery Remaining Percentage: 100 %
Brady Statistic RV Percent Paced: 7 %
Date Time Interrogation Session: 20220627040100
HighPow Impedance: 53 Ohm
Implantable Lead Implant Date: 20160308
Implantable Lead Location: 753860
Implantable Lead Model: 295
Implantable Lead Serial Number: 363165
Implantable Pulse Generator Implant Date: 20160308
Lead Channel Impedance Value: 387 Ohm
Lead Channel Pacing Threshold Amplitude: 0.8 V
Lead Channel Pacing Threshold Pulse Width: 0.5 ms
Lead Channel Setting Pacing Amplitude: 2.4 V
Lead Channel Setting Pacing Pulse Width: 0.5 ms
Lead Channel Setting Sensing Sensitivity: 0.6 mV
Pulse Gen Serial Number: 200269

## 2021-03-23 ENCOUNTER — Telehealth: Payer: Self-pay | Admitting: *Deleted

## 2021-03-23 ENCOUNTER — Encounter: Payer: Self-pay | Admitting: Family Medicine

## 2021-03-23 NOTE — Addendum Note (Signed)
Addended by: Kelle Darting A on: 03/23/2021 03:34 PM   Modules accepted: Orders

## 2021-03-23 NOTE — Telephone Encounter (Signed)
Pt has lab appointment tomorrow. There is an order for CBC, stool cx, and C diff ordered from 01/28/21. Are the stool tests still needed for this appointment?

## 2021-03-24 ENCOUNTER — Other Ambulatory Visit (INDEPENDENT_AMBULATORY_CARE_PROVIDER_SITE_OTHER): Payer: Medicare Other

## 2021-03-24 ENCOUNTER — Encounter: Payer: Self-pay | Admitting: Family Medicine

## 2021-03-24 ENCOUNTER — Other Ambulatory Visit: Payer: Self-pay

## 2021-03-24 DIAGNOSIS — D649 Anemia, unspecified: Secondary | ICD-10-CM | POA: Diagnosis not present

## 2021-03-24 LAB — CBC
HCT: 38.8 % — ABNORMAL LOW (ref 39.0–52.0)
Hemoglobin: 13.2 g/dL (ref 13.0–17.0)
MCHC: 34.1 g/dL (ref 30.0–36.0)
MCV: 86.7 fl (ref 78.0–100.0)
Platelets: 241 10*3/uL (ref 150.0–400.0)
RBC: 4.48 Mil/uL (ref 4.22–5.81)
RDW: 14.7 % (ref 11.5–15.5)
WBC: 7.2 10*3/uL (ref 4.0–10.5)

## 2021-03-28 NOTE — Progress Notes (Signed)
Remote ICD transmission.   

## 2021-04-04 ENCOUNTER — Telehealth: Payer: Self-pay

## 2021-04-04 ENCOUNTER — Ambulatory Visit (INDEPENDENT_AMBULATORY_CARE_PROVIDER_SITE_OTHER): Payer: Medicare Other | Admitting: Physician Assistant

## 2021-04-04 ENCOUNTER — Encounter: Payer: Self-pay | Admitting: Physician Assistant

## 2021-04-04 VITALS — BP 140/72 | HR 64 | Ht 72.0 in | Wt 244.2 lb

## 2021-04-04 DIAGNOSIS — R197 Diarrhea, unspecified: Secondary | ICD-10-CM

## 2021-04-04 DIAGNOSIS — R195 Other fecal abnormalities: Secondary | ICD-10-CM | POA: Diagnosis not present

## 2021-04-04 DIAGNOSIS — Z7901 Long term (current) use of anticoagulants: Secondary | ICD-10-CM

## 2021-04-04 MED ORDER — SUPREP BOWEL PREP KIT 17.5-3.13-1.6 GM/177ML PO SOLN: 1.0000 | ORAL | 0 refills | Status: DC

## 2021-04-04 NOTE — Telephone Encounter (Signed)
[facility] Pre-operative Risk Assessment     Bobby Bradley 1946/06/14 [id number]  Procedure: Colonoscopy Anesthesia type:  MAC Procedure Date: 05/10/21 Provider: Dr. Havery Moros  Type of Clearance needed: Pharmacy  Medication(s) needing held: Eliquis   Length of time for medication to be held: 1-2 days  Please review request and advise by either responding to this message or by sending your response to the fax # provided below.  Thank you,  [facility]  Phone: [phone] Fax: [phone] ATTENTION: Laury Huizar, LPN

## 2021-04-04 NOTE — Patient Instructions (Addendum)
It was my pleasure to provide care to you today. Based on our discussion, I am providing you with my recommendations below:  RECOMMENDATION(S):   You have been scheduled for a colonoscopy. Please follow written instructions given to you at your visit today.   PREP:   Please pick up your prep supplies at the pharmacy within the next 1-3 days.  INHALERS:   If you use inhalers (even only as needed), please bring them with you on the day of your procedure.  MEDICATIONS TO HOLD:  We will contact your provider to request permission for you to hold Eliquis. Once we receive a response, you will be contacted by our office. If you do not hear from our office 1 week prior to your scheduled procedure, please contact our office at [phone].   COLONOSCOPY TIPS:  To reduce nausea and dehydration, stay well hydrated for 3-4 days prior to the exam.  To prevent skin/hemorrhoid irritation - prior to wiping, put A&Dointment or vaseline on the toilet paper. Keep a towel or pad on the bed.  BEFORE STARTING YOUR PREP, drink  64oz of clear liquids in the morning. This will help to flush the colon and will ensure you are well hydrated!!!!  NOTE - This is in addition to the fluids required for to complete your prep. Use of a flavored hard candy, such as grape Anise Salvo, can counteract some of the flavor of the prep and may prevent some nausea.   FOLLOW UP:  After your procedure, you will receive a call from my office staff regarding my recommendation for follow up.  BMI:  If you are age 47 or older, your body mass index should be between 23-30. Your Body mass index is 33.12 kg/m. If this is out of the aforementioned range listed, please consider follow up with your Primary Care Provider.  MY CHART:  The [facility] [facility] providers would like to encourage you to use [facility] to communicate with providers for non-urgent requests or questions.  Due to long hold times on the telephone, sending your  provider a message by [facility] may be a faster and more efficient way to get a response.  Please allow 48 business hours for a response.  Please remember that this is for non-urgent requests.   Thank you for trusting me with your gastrointestinal care!    Ellouise Newer, [place]

## 2021-04-04 NOTE — Progress Notes (Signed)
Chief Complaint: Rectal bleeding  HPI:    Bobby Bradley is a 75 year old male, who recently switched his care to Dr. Havery Moros, with a past medical history of A. fib on Eliquis (12/03/2020 LVEF 40-45%), CHF and status post defibrillator/pacemaker, who was referred to me by Copland, Gay Filler, MD for a complaint of heme positive stool.    09/09/2019 patient seen via telemedicine for discussion of a colonoscopy.  It was advised to have 1 and hold his Eliquis for 24 hours prior.  (It does not look like the patient was ever scheduled)    02/11/2021 patient had fecal occult positive stool.    Today, the patient tells me that he really has no complaints.  He had diarrhea for a couple of months back around May of this year but it just seemed to go away on its own.  He tells me that he never really "figured out why", during that time he had a stool test which was positive for blood and he was told to follow-up with [place].  He never saw any bright red blood or black stool.  Denies any abdominal pain or weight loss.    Denies fever, chills, heartburn or reflux.  Past Medical History:  Diagnosis Date   A-fib [facility])    CHF (congestive heart failure) ([place])    Essential hypertension    Hyperlipidemia    Non-ischemic cardiomyopathy (HCC)    Presence of combination internal cardiac defibrillator (ICD) and pacemaker    Prostate cancer ([place])    TIA (transient ischemic attack)     Past Surgical History:  Procedure Laterality Date   BACK SURGERY     COLONOSCOPY     about 10 years. Done in [place] or [place]    PROSTATECTOMY      Current Outpatient Medications  Medication Sig Dispense Refill   amLODipine (NORVASC) 10 MG tablet Take 1 tablet (10 mg total) by mouth daily. 90 tablet 3   carvedilol (COREG) 25 MG tablet TAKE 1 TABLET(25 MG) BY MOUTH TWICE DAILY 60 tablet 3   COVID-19 mRNA Vac-TriS, Pfizer, (PFIZER-BIONT COVID-19 VAC-TRIS) SUSP injection Inject into the muscle. 0.3 mL 0    ELIQUIS 5 MG TABS tablet TAKE 1 TABLET(5 MG) BY MOUTH TWICE DAILY 180 tablet 1   fluticasone (FLONASE) 50 MCG/ACT nasal spray Place 2 sprays into both nostrils daily. (Patient taking differently: Place 2 sprays into both nostrils as needed.) 16 g 6   losartan (COZAAR) 100 MG tablet TAKE 1 TABLET(100 MG) BY MOUTH DAILY 90 tablet 2   metFORMIN (GLUCOPHAGE) 500 MG tablet Take 1 tablet (500 mg total) by mouth 2 (two) times daily with a meal. 180 tablet 3   Multiple Vitamin (MULTI VITAMIN DAILY PO) Take 1 tablet by mouth daily.     rosuvastatin (CRESTOR) 10 MG tablet Take 1 tablet (10 mg total) by mouth daily. 90 tablet 3   spironolactone (ALDACTONE) 25 MG tablet TAKE 1 TABLET(25 MG) BY MOUTH DAILY 90 tablet 3   No current facility-administered medications for this visit.    Allergies as of 04/04/2021 - Review Complete 04/04/2021  Allergen Reaction Noted   Lisinopril  03/22/2012    Family History  Problem Relation Age of Onset   Lung cancer Mother    Uterine cancer Mother    Lung cancer Father    Atrial fibrillation Sister    Lung cancer Brother    Colon cancer Neg Hx    Esophageal cancer Neg Hx  Pancreatic cancer Neg Hx    Liver disease Neg Hx    Stomach cancer Neg Hx     Social History   Socioeconomic History   Marital status: Married    Spouse name: Not on file   Number of children: 7   Years of education: Not on file   Highest education level: Not on file  Occupational History   Occupation: Art therapist   Tobacco Use   Smoking status: Former    Types: Cigarettes    Quit date: 09/12/1983    Years since quitting: 37.5    Passive exposure: Never   Smokeless tobacco: Never  Vaping Use   Vaping Use: Never used  Substance and Sexual Activity   Alcohol use: Yes    Comment: very rare   Drug use: No   Sexual activity: Not Currently  Other Topics Concern   Not on file  Social History Narrative   Not on file   Social Determinants of Health   Financial  Resource Strain: Not on file  Food Insecurity: Not on file  Transportation Needs: Not on file  Physical Activity: Not on file  Stress: Not on file  Social Connections: Not on file  Intimate Partner Violence: Not on file    Review of Systems:    Constitutional: No weight loss, fever or chills Skin: No rash Cardiovascular: No chest pain Respiratory: No SOB  Gastrointestinal: See HPI and otherwise negative Genitourinary: No dysuria  Neurological: No headache, dizziness or syncope Musculoskeletal: No new muscle or joint pain Hematologic: No bleeding  Psychiatric: No history of depression or anxiety   Physical Exam:  Vital signs: BP 140/72   Pulse 64   Ht 6' (1.829 m)   Wt 244 lb 3.2 oz (110.8 kg)   SpO2 97%   BMI 33.12 kg/m   Constitutional:   Pleasant Caucasian male appears to be in NAD, Well developed, Well nourished, alert and cooperative Respiratory: Respirations even and unlabored. Lungs clear to auscultation bilaterally.   No wheezes, crackles, or rhonchi.  Cardiovascular: Normal S1, S2. No MRG. Regular rate and rhythm. No peripheral edema, cyanosis or pallor.  Gastrointestinal:  Soft, nondistended, nontender. No rebound or guarding. Normal bowel sounds. No appreciable masses or hepatomegaly. Rectal:  Not performed.  Psychiatric: Oriented to person, place and time. Demonstrates good judgement and reason without abnormal affect or behaviors.  RELEVANT LABS AND IMAGING: CBC    Component Value Date/Time   WBC 7.2 03/24/2021 0815   RBC 4.48 03/24/2021 0815   HGB 13.2 03/24/2021 0815   HGB 13.5 05/06/2019 0839   HCT 38.8 (L) 03/24/2021 0815   HCT 39.5 05/06/2019 0839   PLT 241.0 03/24/2021 0815   PLT 246 05/06/2019 0839   MCV 86.7 03/24/2021 0815   MCV 86 05/06/2019 0839   MCH 29.3 05/06/2019 0839   MCHC 34.1 03/24/2021 0815   RDW 14.7 03/24/2021 0815   RDW 13.2 05/06/2019 0839    CMP     Component Value Date/Time   NA 138 01/27/2021 1554   NA 139 05/06/2019  0839   K 4.3 01/27/2021 1554   CL 102 01/27/2021 1554   CO2 25 01/27/2021 1554   GLUCOSE 147 (H) 01/27/2021 1554   BUN 15 01/27/2021 1554   BUN 15 05/06/2019 0839   CREATININE 1.00 01/27/2021 1554   CALCIUM 9.5 01/27/2021 1554   PROT 6.9 01/27/2021 1554   ALBUMIN 3.9 01/27/2021 1554   AST 11 01/27/2021 1554   ALT 16 01/27/2021  [id number] 01/27/2021 1554   BILITOT 0.5 01/27/2021 1554   GFRNONAA 62 05/06/2019 0839   GFRAA 71 05/06/2019 0839    Assessment: 1.  Positive Hemoccult: Positive Hemoccult stool during time of diarrhea; consider hemorrhoids versus polyp versus other 2.  Diarrhea: Resolved over the past month or 2 3.  Chronic anticoagulation: On Eliquis for A. fib  Plan: 1.  Scheduled patient for a diagnostic colonoscopy in the [place] given positive Hemoccult.  This is scheduled with Dr. Havery Moros.  Patient was provided with a detailed list of risks for the procedure and he agrees to proceed. 2.  Patient advised to hold his Eliquis for 2 days prior to time of procedure.  We will communicate with his prescribing physician to ensure this is acceptable for him. 3.  Patient to follow in clinic per recommendations from Dr. Havery Moros after time of procedure.  Bobby Newer, PA-C [facility] 04/04/2021, 8:32 AM  Cc: Darreld Mclean, MD

## 2021-04-04 NOTE — Telephone Encounter (Signed)
   Primary Cardiologist: Kirk Ruths, MD  Chart reviewed as part of pre-operative protocol coverage. Given past medical history and time since last visit, based on ACC/AHA guidelines, Bobby Bradley would be at acceptable risk for the planned procedure without further cardiovascular testing.   She may hold her Eliquis 2 days prior to her procedure.  Please resume as soon as hemostasis is achieved.  I will route this recommendation to the requesting party via Epic fax function and remove from pre-op pool.  Please call with questions.  Jossie Ng. Tenya Araque NP-C    04/04/2021, 4:31 PM [facility] [address] Office [phone] Fax [phone]

## 2021-04-04 NOTE — Progress Notes (Signed)
Agree with assessment and plan as outlined.  

## 2021-04-04 NOTE — Telephone Encounter (Signed)
Called pt and informed about Dr. Jacalyn Lefevre response below. Verbalized acceptance and understanding of his instructions.

## 2021-04-06 NOTE — Telephone Encounter (Signed)
Aleatha Borer, LPN  Licensed Practical Nurse  Other  Telephone Encounter  Signed  Creation Time:  04/04/2021 11:26 AM           Signed      Called pt and informed about Dr. Jacalyn Lefevre response below. Verbalized acceptance and understanding of his instructions.

## 2021-04-24 ENCOUNTER — Other Ambulatory Visit: Payer: Self-pay | Admitting: Cardiology

## 2021-04-25 NOTE — Telephone Encounter (Signed)
Prescription refill request for Eliquis received. Indication:afib Last office visit:[place] 11/03/20 Scr:1.00 01/27/21 Age: 33mWeight:110.8kg

## 2021-04-28 NOTE — Progress Notes (Signed)
HPI: FU atrial fibrillation and CHF. Also with prior ICD. Previously moved from [place]. Patient apparently had significant ectopy in the past and had catheterization revealing no coronary disease. He later had a TIA and was diagnosed with a cardiomyopathy and paroxysmal atrial fibrillation. Abdominal ultrasound February 2018 showed no aneurysm. Echocardiogram March 2022 which showed ejection fraction 40 to 45%, mild left ventricular hypertrophy.  Since last seen patient denies dyspnea, chest pain, palpitations or syncope.  He has had significant fatigue since he had COVID in early 2020.  Current Outpatient Medications  Medication Sig Dispense Refill   amLODipine (NORVASC) 10 MG tablet Take 1 tablet (10 mg total) by mouth daily. 90 tablet 3   carvedilol (COREG) 25 MG tablet TAKE 1 TABLET(25 MG) BY MOUTH TWICE DAILY 60 tablet 3   COVID-19 mRNA Vac-TriS, Pfizer, (PFIZER-BIONT COVID-19 VAC-TRIS) SUSP injection Inject into the muscle. 0.3 mL 0   ELIQUIS 5 MG TABS tablet TAKE 1 TABLET(5 MG) BY MOUTH TWICE DAILY 180 tablet 1   fluticasone (FLONASE) 50 MCG/ACT nasal spray Place 2 sprays into both nostrils daily. (Patient taking differently: Place 2 sprays into both nostrils as needed.) 16 g 6   losartan (COZAAR) 100 MG tablet TAKE 1 TABLET(100 MG) BY MOUTH DAILY 90 tablet 2   metFORMIN (GLUCOPHAGE) 500 MG tablet Take 1 tablet (500 mg total) by mouth 2 (two) times daily with a meal. 180 tablet 3   Multiple Vitamin (MULTI VITAMIN DAILY PO) Take 1 tablet by mouth daily.     rosuvastatin (CRESTOR) 10 MG tablet Take 1 tablet (10 mg total) by mouth daily. 90 tablet 3   spironolactone (ALDACTONE) 25 MG tablet TAKE 1 TABLET(25 MG) BY MOUTH DAILY 90 tablet 3   SUPREP BOWEL PREP KIT 17.5-3.13-1.6 GM/177ML SOLN Take 1 kit by mouth as directed. BIN: [id number] PCN: CN GROUP: [id number] ID: [id number] 354 mL 0   No current facility-administered medications for this visit.     Past Medical History:   Diagnosis Date   A-fib [facility])    CHF (congestive heart failure) ([place])    Essential hypertension    Hyperlipidemia    Non-ischemic cardiomyopathy (HCC)    Presence of combination internal cardiac defibrillator (ICD) and pacemaker    Prostate cancer ([place])    TIA (transient ischemic attack)     Past Surgical History:  Procedure Laterality Date   BACK SURGERY     COLONOSCOPY     about 10 years. Done in [place] or [place] History   Socioeconomic History   Marital status: Married    Spouse name: Not on file   Number of children: 7   Years of education: Not on file   Highest education level: Not on file  Occupational History   Occupation: Art therapist   Tobacco Use   Smoking status: Former    Types: Cigarettes    Quit date: 09/12/1983    Years since quitting: 37.6    Passive exposure: Never   Smokeless tobacco: Never  Vaping Use   Vaping Use: Never used  Substance and Sexual Activity   Alcohol use: Yes    Comment: very rare   Drug use: No   Sexual activity: Not Currently  Other Topics Concern   Not on file  Social History Narrative   Not on file   Social Determinants of Health   Financial Resource Strain: Not on file  Food Insecurity:  Not on file  Transportation Needs: Not on file  Physical Activity: Not on file  Stress: Not on file  Social Connections: Not on file  Intimate Partner Violence: Not on file    Family History  Problem Relation Age of Onset   Lung cancer Mother    Uterine cancer Mother    Lung cancer Father    Atrial fibrillation Sister    Lung cancer Brother    Colon cancer Neg Hx    Esophageal cancer Neg Hx    Pancreatic cancer Neg Hx    Liver disease Neg Hx    Stomach cancer Neg Hx     ROS: Fatigue but no fevers or chills, productive cough, hemoptysis, dysphasia, odynophagia, melena, hematochezia, dysuria, hematuria, rash, seizure activity, orthopnea, PND, pedal edema,  claudication. Remaining systems are negative.  Physical Exam: Well-developed well-nourished in no acute distress.  Skin is warm and dry.  HEENT is normal.  Neck is supple.  Chest is clear to auscultation with normal expansion.  Cardiovascular exam is regular rate and rhythm.  Abdominal exam nontender or distended. No masses palpated. Extremities show no edema. neuro grossly intact  A/P  1 PAF-he remains in sinus on exam; continue beta blocker; apixaban is on hold for colonoscopy scheduled for tomorrow.  Resume when okay with GI.  2 HTN-BP borderline.  We will continue present medications and follow.  Patient states blood pressure is 130/85 average at home.  3 NICM-continue beta blocker, ARB and spironolactone.  4 ICD-per EP.   5 fatigue-this has been present since his COVID infection in January 2020.  Unchanged.  Kirk Ruths, MD

## 2021-05-09 ENCOUNTER — Encounter: Payer: Self-pay | Admitting: Cardiology

## 2021-05-09 ENCOUNTER — Ambulatory Visit (INDEPENDENT_AMBULATORY_CARE_PROVIDER_SITE_OTHER): Payer: Medicare Other | Admitting: Cardiology

## 2021-05-09 ENCOUNTER — Other Ambulatory Visit: Payer: Self-pay

## 2021-05-09 VITALS — BP 142/82 | HR 60 | Ht 72.0 in | Wt 246.1 lb

## 2021-05-09 DIAGNOSIS — I10 Essential (primary) hypertension: Secondary | ICD-10-CM | POA: Diagnosis not present

## 2021-05-09 DIAGNOSIS — I428 Other cardiomyopathies: Secondary | ICD-10-CM | POA: Diagnosis not present

## 2021-05-09 DIAGNOSIS — I429 Cardiomyopathy, unspecified: Secondary | ICD-10-CM | POA: Diagnosis not present

## 2021-05-09 DIAGNOSIS — Z95810 Presence of automatic (implantable) cardiac defibrillator: Secondary | ICD-10-CM

## 2021-05-09 DIAGNOSIS — I480 Paroxysmal atrial fibrillation: Secondary | ICD-10-CM

## 2021-05-09 NOTE — Patient Instructions (Signed)

## 2021-05-10 ENCOUNTER — Ambulatory Visit (AMBULATORY_SURGERY_CENTER): Payer: Medicare Other | Admitting: Gastroenterology

## 2021-05-10 ENCOUNTER — Encounter: Payer: Self-pay | Admitting: Gastroenterology

## 2021-05-10 VITALS — BP 113/60 | HR 62 | Temp 97.6°F | Resp 14 | Ht 72.0 in | Wt 244.0 lb

## 2021-05-10 DIAGNOSIS — K6289 Other specified diseases of anus and rectum: Secondary | ICD-10-CM

## 2021-05-10 DIAGNOSIS — D123 Benign neoplasm of transverse colon: Secondary | ICD-10-CM

## 2021-05-10 DIAGNOSIS — I509 Heart failure, unspecified: Secondary | ICD-10-CM | POA: Diagnosis not present

## 2021-05-10 DIAGNOSIS — D125 Benign neoplasm of sigmoid colon: Secondary | ICD-10-CM | POA: Diagnosis not present

## 2021-05-10 DIAGNOSIS — D124 Benign neoplasm of descending colon: Secondary | ICD-10-CM | POA: Diagnosis not present

## 2021-05-10 DIAGNOSIS — K5730 Diverticulosis of large intestine without perforation or abscess without bleeding: Secondary | ICD-10-CM

## 2021-05-10 DIAGNOSIS — I4891 Unspecified atrial fibrillation: Secondary | ICD-10-CM | POA: Diagnosis not present

## 2021-05-10 DIAGNOSIS — R195 Other fecal abnormalities: Secondary | ICD-10-CM

## 2021-05-10 DIAGNOSIS — D122 Benign neoplasm of ascending colon: Secondary | ICD-10-CM

## 2021-05-10 DIAGNOSIS — D120 Benign neoplasm of cecum: Secondary | ICD-10-CM | POA: Diagnosis not present

## 2021-05-10 MED ORDER — SODIUM CHLORIDE 0.9 % IV SOLN: 500.0000 mL | Freq: Once | INTRAVENOUS | Status: DC

## 2021-05-10 NOTE — Patient Instructions (Signed)
Information on polyps, hemorrhoids and diverticulosis given to you today.  Await pathology results.  Resume previous diet and medications.  Restart Eliquis tomorrow at prior dose.   YOU HAD AN ENDOSCOPIC PROCEDURE TODAY AT [facility]:   Refer to the procedure report that was given to you for any specific questions about what was found during the examination.  If the procedure report does not answer your questions, please call your gastroenterologist to clarify.  If you requested that your care partner not be given the details of your procedure findings, then the procedure report has been included in a sealed envelope for you to review at your convenience later.  YOU SHOULD EXPECT: Some feelings of bloating in the abdomen. Passage of more gas than usual.  Walking can help get rid of the air that was put into your GI tract during the procedure and reduce the bloating. If you had a lower endoscopy (such as a colonoscopy or flexible sigmoidoscopy) you may notice spotting of blood in your stool or on the toilet paper. If you underwent a bowel prep for your procedure, you may not have a normal bowel movement for a few days.  Please Note:  You might notice some irritation and congestion in your nose or some drainage.  This is from the oxygen used during your procedure.  There is no need for concern and it should clear up in a day or so.  SYMPTOMS TO REPORT IMMEDIATELY:  Following lower endoscopy (colonoscopy or flexible sigmoidoscopy):  Excessive amounts of blood in the stool  Significant tenderness or worsening of abdominal pains  Swelling of the abdomen that is new, acute  Fever of 100F or higher  For urgent or emergent issues, a gastroenterologist can be reached at any hour by calling [phone]. Do not use MyChart messaging for urgent concerns.    DIET:  We do recommend a small meal at first, but then you may proceed to your regular diet.  Drink plenty of fluids but you  should avoid alcoholic beverages for 24 hours.  ACTIVITY:  You should plan to take it easy for the rest of today and you should NOT DRIVE or use heavy machinery until tomorrow (because of the sedation medicines used during the test).    FOLLOW UP: Our staff will call the number listed on your records 48-72 hours following your procedure to check on you and address any questions or concerns that you may have regarding the information given to you following your procedure. If we do not reach you, we will leave a message.  We will attempt to reach you two times.  During this call, we will ask if you have developed any symptoms of COVID 19. If you develop any symptoms (ie: fever, flu-like symptoms, shortness of breath, cough etc.) before then, please call [phone].  If you test positive for Covid 19 in the 2 weeks post procedure, please call and report this information to [place].    If any biopsies were taken you will be contacted by phone or by letter within the next 1-3 weeks.  Please call us at [phone] if you have not heard about the biopsies in 3 weeks.    SIGNATURES/CONFIDENTIALITY: You and/or your care partner have signed paperwork which will be entered into your electronic medical record.  These signatures attest to the fact that that the information above on your After Visit Summary has been reviewed and is understood.  Full responsibility of the confidentiality of  this discharge information lies with you and/or your care-partner.

## 2021-05-10 NOTE — Progress Notes (Signed)
PT taken to PACU. Monitors in place. VSS. Report given to RN. 

## 2021-05-10 NOTE — Progress Notes (Signed)
VS taken by C.W. 

## 2021-05-10 NOTE — Op Note (Signed)
[place] Patient Name: Bobby Bradley Procedure Date: 05/10/2021 8:30 AM MRN: [id number] Endoscopist: Remo Lipps P. Havery Moros , MD Age: 75 Referring MD:  Date of Birth: [birth year] Gender: Male Account #: [id number] Procedure:                Colonoscopy Indications:              Heme positive stool, last colonoscopy 12 years ago Medicines:                Monitored Anesthesia Care Procedure:                Pre-Anesthesia Assessment:                           - Prior to the procedure, a History and Physical                            was performed, and patient medications and                            allergies were reviewed. The patient's tolerance of                            previous anesthesia was also reviewed. The risks                            and benefits of the procedure and the sedation                            options and risks were discussed with the patient.                            All questions were answered, and informed consent                            was obtained. Prior Anticoagulants: The patient has                            taken Eliquis (apixaban), last dose was 2 days                            prior to procedure. ASA Grade Assessment: III - A                            patient with severe systemic disease. After                            reviewing the risks and benefits, the patient was                            deemed in satisfactory condition to undergo the                            procedure.  After obtaining informed consent, the colonoscope                            was passed under direct vision. Throughout the                            procedure, the patient's blood pressure, pulse, and                            oxygen saturations were monitored continuously. The                            Olympus CF-HQ190L (Serial# [id number]) Colonoscope was                            introduced through the anus and  advanced to the the                            cecum, identified by appendiceal orifice and                            ileocecal valve. The colonoscopy was performed                            without difficulty. The patient tolerated the                            procedure well. The quality of the bowel                            preparation was adequate. The ileocecal valve,                            appendiceal orifice, and rectum were photographed. Scope In: 8:43:57 AM Scope Out: 9:05:54 AM Scope Withdrawal Time: 0 hours 15 minutes 59 seconds  Total Procedure Duration: 0 hours 21 minutes 57 seconds  Findings:                 The perianal and digital rectal examinations were                            normal.                           The terminal ileum appeared normal.                           Two sessile polyps were found in the cecum. The                            polyps were 3 to 6 mm in size. These polyps were                            removed with a cold snare. Resection and retrieval  were complete.                           A 3 mm polyp was found in the ascending colon. The                            polyp was sessile. The polyp was removed with a                            cold snare. Resection and retrieval were complete.                           Four sessile polyps were found in the hepatic                            flexure. The polyps were 3 to 7 mm in size. These                            polyps were removed with a cold snare. Resection                            and retrieval were complete.                           Four sessile polyps were found in the transverse                            colon. The polyps were 3 to 5 mm in size. These                            polyps were removed with a cold snare. Resection                            and retrieval were complete.                           Two sessile polyps were found in the  descending                            colon. The polyps were 3 to 4 mm in size. These                            polyps were removed with a cold snare. Resection                            and retrieval were complete.                           A 4 mm polyp was found in the sigmoid colon. The                            polyp was sessile. The polyp was removed with a  cold snare. Resection and retrieval were complete.                           Multiple medium-mouthed diverticula were found in                            the sigmoid colon.                           Anal papilla(e) were hypertrophied.                           Internal hemorrhoids were found during retroflexion.                           The exam was otherwise without abnormality. Complications:            No immediate complications. Estimated blood loss:                            Minimal. Estimated Blood Loss:     Estimated blood loss was minimal. Impression:               - The examined portion of the ileum was normal.                           - Two 3 to 6 mm polyps in the cecum, removed with a                            cold snare. Resected and retrieved.                           - One 3 mm polyp in the ascending colon, removed                            with a cold snare. Resected and retrieved.                           - Four 3 to 7 mm polyps at the hepatic flexure,                            removed with a cold snare. Resected and retrieved.                           - Four 3 to 5 mm polyps in the transverse colon,                            removed with a cold snare. Resected and retrieved.                           - Two 3 to 4 mm polyps in the descending colon,                            removed with a cold snare. Resected  and retrieved.                           - One 4 mm polyp in the sigmoid colon, removed with                            a cold snare. Resected and retrieved.                            - Diverticulosis in the sigmoid colon.                           - Anal papilla(e) were hypertrophied.                           - Internal hemorrhoids.                           - The examination was otherwise normal. Recommendation:           - Patient has a contact number available for                            emergencies. The signs and symptoms of potential                            delayed complications were discussed with the                            patient. Return to normal activities tomorrow.                            Written discharge instructions were provided to the                            patient.                           - Resume previous diet.                           - Continue present medications.                           - Resume Eliquis tomorrow                           - Await pathology results. Remo Lipps P. Deshone Lyssy, MD 05/10/2021 9:13:52 AM This report has been signed electronically.

## 2021-05-10 NOTE — Progress Notes (Signed)
[facility] History and Physical   Primary Care Physician:  Copland, Gay Filler, MD   Reason for Procedure:   Heme positive stool  Plan:    Colonoscopy     HPI: Bobby Bradley is a 75 y.o. male who last had a colonoscopy 12 years ago, here for colonoscopy to evaluate heme positive stool. He does not see any blood himself. Normal bowel habits at this time. History of CHF / AF, on Eliquis, last taken on Sat - he denies any cardiopulmonary symptoms and feeling well at this time.   Past Medical History:  Diagnosis Date   A-fib [facility])    CHF (congestive heart failure) ([place])    Essential hypertension    Hyperlipidemia    Non-ischemic cardiomyopathy (HCC)    Presence of combination internal cardiac defibrillator (ICD) and pacemaker    Prostate cancer ([place])    TIA (transient ischemic attack)     Past Surgical History:  Procedure Laterality Date   BACK SURGERY     COLONOSCOPY     about 10 years. Done in [place] or [place]    ICD IMPLANT  2012   PROSTATECTOMY      Prior to Admission medications   Medication Sig Start Date End Date Taking? Authorizing Provider  amLODipine (NORVASC) 10 MG tablet Take 1 tablet (10 mg total) by mouth daily. 09/08/20  Yes Copland, Gay Filler, MD  carvedilol (COREG) 25 MG tablet TAKE 1 TABLET(25 MG) BY MOUTH TWICE DAILY 11/16/20  Yes Lelon Perla, MD  losartan (COZAAR) 100 MG tablet TAKE 1 TABLET(100 MG) BY MOUTH DAILY 03/04/21  Yes Lelon Perla, MD  metFORMIN (GLUCOPHAGE) 500 MG tablet Take 1 tablet (500 mg total) by mouth 2 (two) times daily with a meal. 09/08/20  Yes Copland, Gay Filler, MD  Multiple Vitamin (MULTI VITAMIN DAILY PO) Take 1 tablet by mouth daily.   Yes [provider]  rosuvastatin (CRESTOR) 10 MG tablet Take 1 tablet (10 mg total) by mouth daily. 09/08/20  Yes Copland, Gay Filler, MD  spironolactone (ALDACTONE) 25 MG tablet TAKE 1 TABLET(25 MG) BY MOUTH DAILY 08/30/20  Yes Crenshaw, Denice Bors,  MD  COVID-19 mRNA Vac-TriS, Pfizer, (PFIZER-BIONT COVID-19 VAC-TRIS) SUSP injection Inject into the muscle. 02/10/21   Carlyle Basques, MD  ELIQUIS 5 MG TABS tablet TAKE 1 TABLET(5 MG) BY MOUTH TWICE DAILY 04/25/21   Lelon Perla, MD  fluticasone [facility]) 50 MCG/ACT nasal spray Place 2 sprays into both nostrils daily. Patient taking differently: Place 2 sprays into both nostrils as needed. 12/15/17   Midge Minium, MD    Current Outpatient Medications  Medication Sig Dispense Refill   amLODipine (NORVASC) 10 MG tablet Take 1 tablet (10 mg total) by mouth daily. 90 tablet 3   carvedilol (COREG) 25 MG tablet TAKE 1 TABLET(25 MG) BY MOUTH TWICE DAILY 60 tablet 3   losartan (COZAAR) 100 MG tablet TAKE 1 TABLET(100 MG) BY MOUTH DAILY 90 tablet 2   metFORMIN (GLUCOPHAGE) 500 MG tablet Take 1 tablet (500 mg total) by mouth 2 (two) times daily with a meal. 180 tablet 3   Multiple Vitamin (MULTI VITAMIN DAILY PO) Take 1 tablet by mouth daily.     rosuvastatin (CRESTOR) 10 MG tablet Take 1 tablet (10 mg total) by mouth daily. 90 tablet 3   spironolactone (ALDACTONE) 25 MG tablet TAKE 1 TABLET(25 MG) BY MOUTH DAILY 90 tablet 3   COVID-19 mRNA Vac-TriS, Pfizer, (PFIZER-BIONT COVID-19 VAC-TRIS) SUSP injection Inject into  the muscle. 0.3 mL 0   ELIQUIS 5 MG TABS tablet TAKE 1 TABLET(5 MG) BY MOUTH TWICE DAILY 180 tablet 1   fluticasone (FLONASE) 50 MCG/ACT nasal spray Place 2 sprays into both nostrils daily. (Patient taking differently: Place 2 sprays into both nostrils as needed.) 16 g 6   Current Facility-Administered Medications  Medication Dose Route Frequency Provider Last Rate Last Admin   0.9 %  sodium chloride infusion  500 mL Intravenous Once Hajar Penninger, Carlota Raspberry, MD        Allergies as of 05/10/2021 - Review Complete 05/10/2021  Allergen Reaction Noted   Lisinopril  03/22/2012    Family History  Problem Relation Age of Onset   Lung cancer Mother    Uterine cancer Mother    Lung  cancer Father    Atrial fibrillation Sister    Lung cancer Brother    Colon cancer Neg Hx    Esophageal cancer Neg Hx    Pancreatic cancer Neg Hx    Liver disease Neg Hx    Stomach cancer Neg Hx    Rectal cancer Neg Hx     Social History   Socioeconomic History   Marital status: Married    Spouse name: Not on file   Number of children: 7   Years of education: Not on file   Highest education level: Not on file  Occupational History   Occupation: Art therapist   Tobacco Use   Smoking status: Former    Types: Cigarettes    Quit date: 09/12/1983    Years since quitting: 37.6    Passive exposure: Never   Smokeless tobacco: Never  Vaping Use   Vaping Use: Never used  Substance and Sexual Activity   Alcohol use: Yes    Comment: very rare   Drug use: No   Sexual activity: Not Currently  Other Topics Concern   Not on file  Social History Narrative   Not on file   Social Determinants of Health   Financial Resource Strain: Not on file  Food Insecurity: Not on file  Transportation Needs: Not on file  Physical Activity: Not on file  Stress: Not on file  Social Connections: Not on file  Intimate Partner Violence: Not on file    Review of Systems: All other review of systems negative except as mentioned in the HPI.  Physical Exam: Vital signs BP 136/73   Pulse 60   Temp 97.6 F (36.4 C)   Resp 13   Ht 6' (1.829 m)   Wt 244 lb (110.7 kg)   SpO2 97%   BMI 33.09 kg/m   General:   Alert,  Well-developed, well-nourished, pleasant and cooperative in NAD Lungs:  Clear throughout to auscultation.   Heart:  Regular rate and rhythm;  Abdomen:  Soft, nontender and nondistended.  Neuro/Psych:  Alert and cooperative. Normal mood and affect. A and O x 3  Jolly Mango, MD [facility]

## 2021-05-10 NOTE — Progress Notes (Signed)
Called to room to assist during endoscopic procedure.  Patient ID and intended procedure confirmed with present staff. Received instructions for my participation in the procedure from the performing physician.  

## 2021-05-12 ENCOUNTER — Telehealth: Payer: Self-pay | Admitting: *Deleted

## 2021-05-12 NOTE — Telephone Encounter (Signed)
  Follow up Call-  Call back number 05/10/2021  Post procedure Call Back phone  # [phone]  Permission to leave phone message Yes  Some recent data might be hidden     Patient questions:  Do you have a fever, pain , or abdominal swelling? No. Pain Score  0 *  Have you tolerated food without any problems? Yes.    Have you been able to return to your normal activities? Yes.    Do you have any questions about your discharge instructions: Diet   No. Medications  No. Follow up visit  No.  Do you have questions or concerns about your Care? No.  Actions: * If pain score is 4 or above: No action needed, pain <4.  Have you developed a fever since your procedure? no  2.   Have you had an respiratory symptoms (SOB or cough) since your procedure? no  3.   Have you tested positive for COVID 19 since your procedure no  4.   Have you had any family members/close contacts diagnosed with the COVID 19 since your procedure?  no   If yes to any of these questions please route to Joylene John, RN and Joella Prince, RN

## 2021-05-19 ENCOUNTER — Other Ambulatory Visit: Payer: Self-pay

## 2021-05-19 DIAGNOSIS — R195 Other fecal abnormalities: Secondary | ICD-10-CM

## 2021-05-19 DIAGNOSIS — D649 Anemia, unspecified: Secondary | ICD-10-CM

## 2021-05-23 ENCOUNTER — Other Ambulatory Visit (HOSPITAL_BASED_OUTPATIENT_CLINIC_OR_DEPARTMENT_OTHER): Payer: Self-pay

## 2021-05-27 ENCOUNTER — Other Ambulatory Visit (INDEPENDENT_AMBULATORY_CARE_PROVIDER_SITE_OTHER): Payer: Medicare Other

## 2021-05-27 DIAGNOSIS — R195 Other fecal abnormalities: Secondary | ICD-10-CM

## 2021-05-27 DIAGNOSIS — D649 Anemia, unspecified: Secondary | ICD-10-CM | POA: Diagnosis not present

## 2021-05-27 LAB — CBC WITH DIFFERENTIAL/PLATELET
Basophils Absolute: 0 10*3/uL (ref 0.0–0.1)
Basophils Relative: 0.6 % (ref 0.0–3.0)
Eosinophils Absolute: 0.3 10*3/uL (ref 0.0–0.7)
Eosinophils Relative: 4.2 % (ref 0.0–5.0)
HCT: 38.8 % — ABNORMAL LOW (ref 39.0–52.0)
Hemoglobin: 13.1 g/dL (ref 13.0–17.0)
Lymphocytes Relative: 23.9 % (ref 12.0–46.0)
Lymphs Abs: 1.7 10*3/uL (ref 0.7–4.0)
MCHC: 33.7 g/dL (ref 30.0–36.0)
MCV: 87.4 fl (ref 78.0–100.0)
Monocytes Absolute: 0.6 10*3/uL (ref 0.1–1.0)
Monocytes Relative: 9.2 % (ref 3.0–12.0)
Neutro Abs: 4.3 10*3/uL (ref 1.4–7.7)
Neutrophils Relative %: 62.1 % (ref 43.0–77.0)
Platelets: 215 10*3/uL (ref 150.0–400.0)
RBC: 4.44 Mil/uL (ref 4.22–5.81)
RDW: 14.1 % (ref 11.5–15.5)
WBC: 6.9 10*3/uL (ref 4.0–10.5)

## 2021-05-28 LAB — IRON,TIBC AND FERRITIN PANEL
%SAT: 20 % (calc) (ref 20–48)
Ferritin: 27 ng/mL (ref 24–380)
Iron: 70 ug/dL (ref 50–180)
TIBC: 343 mcg/dL (calc) (ref 250–425)

## 2021-06-06 ENCOUNTER — Ambulatory Visit (INDEPENDENT_AMBULATORY_CARE_PROVIDER_SITE_OTHER): Payer: Medicare Other

## 2021-06-06 DIAGNOSIS — I428 Other cardiomyopathies: Secondary | ICD-10-CM | POA: Diagnosis not present

## 2021-06-06 LAB — CUP PACEART REMOTE DEVICE CHECK
Battery Remaining Longevity: 120 mo
Battery Remaining Percentage: 100 %
Brady Statistic RV Percent Paced: 5 %
Date Time Interrogation Session: 20220926040200
HighPow Impedance: 50 Ohm
Implantable Lead Implant Date: 20160308
Implantable Lead Location: 753860
Implantable Lead Model: 295
Implantable Lead Serial Number: 363165
Implantable Pulse Generator Implant Date: 20160308
Lead Channel Impedance Value: 383 Ohm
Lead Channel Pacing Threshold Amplitude: 0.8 V
Lead Channel Pacing Threshold Pulse Width: 0.5 ms
Lead Channel Setting Pacing Amplitude: 2.4 V
Lead Channel Setting Pacing Pulse Width: 0.5 ms
Lead Channel Setting Sensing Sensitivity: 0.6 mV
Pulse Gen Serial Number: 200269

## 2021-06-13 NOTE — Progress Notes (Signed)
Remote ICD transmission.   

## 2021-07-13 ENCOUNTER — Ambulatory Visit (INDEPENDENT_AMBULATORY_CARE_PROVIDER_SITE_OTHER): Payer: Medicare Other

## 2021-07-13 VITALS — Ht 72.0 in | Wt 244.0 lb

## 2021-07-13 DIAGNOSIS — Z0000 Encounter for general adult medical examination without abnormal findings: Secondary | ICD-10-CM | POA: Diagnosis not present

## 2021-07-13 NOTE — Patient Instructions (Signed)
Bobby Bradley , Thank you for taking time to complete your Medicare Wellness Visit. I appreciate your ongoing commitment to your health goals. Please review the following plan we discussed and let me know if I can assist you in the future.   Screening recommendations/referrals: Colonoscopy: Completed 05/10/2021 Recommended yearly ophthalmology/optometry visit for glaucoma screening and checkup Recommended yearly dental visit for hygiene and checkup  Vaccinations: Influenza vaccine: Due-May obtain vaccine at our office or your local pharmacy. Pneumococcal vaccine: Up to date Tdap vaccine: Up to date-Due-09/08/2030 Shingles vaccine: Discuss with pharmacy   Covid-19: Booster available at the pharmacy  Advanced directives: Declined information today.  Conditions/risks identified: See problem list  Next appointment: Follow up in one year for your annual wellness visit.   Preventive Care 12 Years and Older, Male Preventive care refers to lifestyle choices and visits with your health care provider that can promote health and wellness. What does preventive care include? A yearly physical exam. This is also called an annual well check. Dental exams once or twice a year. Routine eye exams. Ask your health care provider how often you should have your eyes checked. Personal lifestyle choices, including: Daily care of your teeth and gums. Regular physical activity. Eating a healthy diet. Avoiding tobacco and drug use. Limiting alcohol use. Practicing safe sex. Taking low doses of aspirin every day. Taking vitamin and mineral supplements as recommended by your health care provider. What happens during an annual well check? The services and screenings done by your health care provider during your annual well check will depend on your age, overall health, lifestyle risk factors, and family history of disease. Counseling  Your health care provider may ask you questions about your: Alcohol  use. Tobacco use. Drug use. Emotional well-being. Home and relationship well-being. Sexual activity. Eating habits. History of falls. Memory and ability to understand (cognition). Work and work Statistician. Screening  You may have the following tests or measurements: Height, weight, and BMI. Blood pressure. Lipid and cholesterol levels. These may be checked every 5 years, or more frequently if you are over 52 years old. Skin check. Lung cancer screening. You may have this screening every year starting at age 30 if you have a 30-pack-year history of smoking and currently smoke or have quit within the past 15 years. Fecal occult blood test (FOBT) of the stool. You may have this test every year starting at age 41. Flexible sigmoidoscopy or colonoscopy. You may have a sigmoidoscopy every 5 years or a colonoscopy every 10 years starting at age 32. Prostate cancer screening. Recommendations will vary depending on your family history and other risks. Hepatitis C blood test. Hepatitis B blood test. Sexually transmitted disease (STD) testing. Diabetes screening. This is done by checking your blood sugar (glucose) after you have not eaten for a while (fasting). You may have this done every 1-3 years. Abdominal aortic aneurysm (AAA) screening. You may need this if you are a current or former smoker. Osteoporosis. You may be screened starting at age 48 if you are at high risk. Talk with your health care provider about your test results, treatment options, and if necessary, the need for more tests. Vaccines  Your health care provider may recommend certain vaccines, such as: Influenza vaccine. This is recommended every year. Tetanus, diphtheria, and acellular pertussis (Tdap, Td) vaccine. You may need a Td booster every 10 years. Zoster vaccine. You may need this after age 35. Pneumococcal 13-valent conjugate (PCV13) vaccine. One dose is recommended after age 92.  Pneumococcal polysaccharide  (PPSV23) vaccine. One dose is recommended after age 17. Talk to your health care provider about which screenings and vaccines you need and how often you need them. This information is not intended to replace advice given to you by your health care provider. Make sure you discuss any questions you have with your health care provider. Document Released: 09/24/2015 Document Revised: 05/17/2016 Document Reviewed: 06/29/2015 Elsevier Interactive Patient Education  2017 [place] Prevention in the Home Falls can cause injuries. They can happen to people of all ages. There are many things you can do to make your home safe and to help prevent falls. What can I do on the outside of my home? Regularly fix the edges of walkways and driveways and fix any cracks. Remove anything that might make you trip as you walk through a door, such as a raised step or threshold. Trim any bushes or trees on the path to your home. Use bright outdoor lighting. Clear any walking paths of anything that might make someone trip, such as rocks or tools. Regularly check to see if handrails are loose or broken. Make sure that both sides of any steps have handrails. Any raised decks and porches should have guardrails on the edges. Have any leaves, snow, or ice cleared regularly. Use sand or salt on walking paths during winter. Clean up any spills in your garage right away. This includes oil or grease spills. What can I do in the bathroom? Use night lights. Install grab bars by the toilet and in the tub and shower. Do not use towel bars as grab bars. Use non-skid mats or decals in the tub or shower. If you need to sit down in the shower, use a plastic, non-slip stool. Keep the floor dry. Clean up any water that spills on the floor as soon as it happens. Remove soap buildup in the tub or shower regularly. Attach bath mats securely with double-sided non-slip rug tape. Do not have throw rugs and other things on the  floor that can make you trip. What can I do in the bedroom? Use night lights. Make sure that you have a light by your bed that is easy to reach. Do not use any sheets or blankets that are too big for your bed. They should not hang down onto the floor. Have a firm chair that has side arms. You can use this for support while you get dressed. Do not have throw rugs and other things on the floor that can make you trip. What can I do in the kitchen? Clean up any spills right away. Avoid walking on wet floors. Keep items that you use a lot in easy-to-reach places. If you need to reach something above you, use a strong step stool that has a grab bar. Keep electrical cords out of the way. Do not use floor polish or wax that makes floors slippery. If you must use wax, use non-skid floor wax. Do not have throw rugs and other things on the floor that can make you trip. What can I do with my stairs? Do not leave any items on the stairs. Make sure that there are handrails on both sides of the stairs and use them. Fix handrails that are broken or loose. Make sure that handrails are as long as the stairways. Check any carpeting to make sure that it is firmly attached to the stairs. Fix any carpet that is loose or worn. Avoid having throw rugs at the  top or bottom of the stairs. If you do have throw rugs, attach them to the floor with carpet tape. Make sure that you have a light switch at the top of the stairs and the bottom of the stairs. If you do not have them, ask someone to add them for you. What else can I do to help prevent falls? Wear shoes that: Do not have high heels. Have rubber bottoms. Are comfortable and fit you well. Are closed at the toe. Do not wear sandals. If you use a stepladder: Make sure that it is fully opened. Do not climb a closed stepladder. Make sure that both sides of the stepladder are locked into place. Ask someone to hold it for you, if possible. Clearly mark and make  sure that you can see: Any grab bars or handrails. First and last steps. Where the edge of each step is. Use tools that help you move around (mobility aids) if they are needed. These include: Canes. Walkers. Scooters. Crutches. Turn on the lights when you go into a dark area. Replace any light bulbs as soon as they burn out. Set up your furniture so you have a clear path. Avoid moving your furniture around. If any of your floors are uneven, fix them. If there are any pets around you, be aware of where they are. Review your medicines with your doctor. Some medicines can make you feel dizzy. This can increase your chance of falling. Ask your doctor what other things that you can do to help prevent falls. This information is not intended to replace advice given to you by your health care provider. Make sure you discuss any questions you have with your health care provider. Document Released: 06/24/2009 Document Revised: 02/03/2016 Document Reviewed: 10/02/2014 Elsevier Interactive Patient Education  2017 [employer].

## 2021-07-13 NOTE — Progress Notes (Signed)
Subjective:   Bobby Bradley is a 75 y.o. male who presents for Medicare Annual/Initial preventive examination.  I connected with Bobby Bradley today by telephone and verified that I am speaking with the correct person using two identifiers. Location patient: home Location provider: work Persons participating in the virtual visit: patient, Marine scientist.    I discussed the limitations, risks, security and privacy concerns of performing an evaluation and management service by telephone and the availability of in person appointments. I also discussed with the patient that there may be a patient responsible charge related to this service. The patient expressed understanding and verbally consented to this telephonic visit.    Interactive audio and video telecommunications were attempted between this provider and patient, however failed, due to patient having technical difficulties OR patient did not have access to video capability.  We continued and completed visit with audio only.  Some vital signs may be absent or patient reported.   Time Spent with patient on telephone encounter: 20 minutes   Review of Systems     Cardiac Risk Factors include: advanced age (>48men, >14 women);diabetes mellitus;dyslipidemia;male gender;hypertension;obesity (BMI >30kg/m2)     Objective:    Today's Vitals   07/13/21 1539  Weight: 244 lb (110.7 kg)  Height: 6' (1.829 m)   Body mass index is 33.09 kg/m.  Advanced Directives 07/13/2021  Does Patient Have a Medical Advance Directive? No  Would patient like information on creating a medical advance directive? No - Patient declined    Current Medications (verified) Outpatient Encounter Medications as of 07/13/2021  Medication Sig   amLODipine (NORVASC) 10 MG tablet Take 1 tablet (10 mg total) by mouth daily.   carvedilol (COREG) 25 MG tablet TAKE 1 TABLET(25 MG) BY MOUTH TWICE DAILY   ELIQUIS 5 MG TABS tablet TAKE 1 TABLET(5 MG) BY MOUTH TWICE DAILY   fluticasone  (FLONASE) 50 MCG/ACT nasal spray Place 2 sprays into both nostrils daily. (Patient taking differently: Place 2 sprays into both nostrils as needed.)   losartan (COZAAR) 100 MG tablet TAKE 1 TABLET(100 MG) BY MOUTH DAILY   metFORMIN (GLUCOPHAGE) 500 MG tablet Take 1 tablet (500 mg total) by mouth 2 (two) times daily with a meal.   Multiple Vitamin (MULTI VITAMIN DAILY PO) Take 1 tablet by mouth daily.   rosuvastatin (CRESTOR) 10 MG tablet Take 1 tablet (10 mg total) by mouth daily.   spironolactone (ALDACTONE) 25 MG tablet TAKE 1 TABLET(25 MG) BY MOUTH DAILY   COVID-19 mRNA Vac-TriS, Pfizer, (PFIZER-BIONT COVID-19 VAC-TRIS) SUSP injection Inject into the muscle. (Patient not taking: Reported on 07/13/2021)   Facility-Administered Encounter Medications as of 07/13/2021  Medication   0.9 %  sodium chloride infusion    Allergies (verified) Lisinopril   History: Past Medical History:  Diagnosis Date   A-fib (HCC)    CHF (congestive heart failure) (HCC)    Essential hypertension    Hyperlipidemia    Non-ischemic cardiomyopathy (HCC)    Presence of combination internal cardiac defibrillator (ICD) and pacemaker    Prostate cancer ([place])    TIA (transient ischemic attack)    Past Surgical History:  Procedure Laterality Date   BACK SURGERY     COLONOSCOPY     about 10 years. Done in [place] or [place]    ICD IMPLANT  2012   PROSTATECTOMY     Family History  Problem Relation Age of Onset   Lung cancer Mother    Uterine cancer Mother    Lung cancer  Father    Atrial fibrillation Sister    Lung cancer Brother    Colon cancer Neg Hx    Esophageal cancer Neg Hx    Pancreatic cancer Neg Hx    Liver disease Neg Hx    Stomach cancer Neg Hx    Rectal cancer Neg Hx    Social History   Socioeconomic History   Marital status: Married    Spouse name: Not on file   Number of children: 7   Years of education: Not on file   Highest education level: Not on file   Occupational History   Occupation: Art therapist   Tobacco Use   Smoking status: Former    Types: Cigarettes    Quit date: 09/12/1983    Years since quitting: 37.8    Passive exposure: Never   Smokeless tobacco: Never  Vaping Use   Vaping Use: Never used  Substance and Sexual Activity   Alcohol use: Yes    Comment: very rare   Drug use: No   Sexual activity: Not Currently  Other Topics Concern   Not on file  Social History Narrative   Not on file   Social Determinants of Health   Financial Resource Strain: Low Risk    Difficulty of Paying Living Expenses: Not hard at all  Food Insecurity: No Food Insecurity   Worried About Charity fundraiser in the Last Year: Never true   [place] in the Last Year: Never true  Transportation Needs: No Transportation Needs   Lack of Transportation (Medical): No   Lack of Transportation (Non-Medical): No  Physical Activity: Inactive   Days of Exercise per Week: 0 days   Minutes of Exercise per Session: 0 min  Stress: No Stress Concern Present   Feeling of Stress : Not at all  Social Connections: Socially Integrated   Frequency of Communication with Friends and Family: More than three times a week   Frequency of Social Gatherings with Friends and Family: More than three times a week   Attends Religious Services: More than 4 times per year   Active Member of [employer] or Organizations: Yes   Attends Music therapist: More than 4 times per year   Marital Status: Married    Tobacco Counseling Counseling given: Not Answered   Clinical Intake:  Pre-visit preparation completed: Yes  Pain : No/denies pain     BMI - recorded: 33.09 Nutritional Status: BMI > 30  Obese Nutritional Risks: None Diabetes: Yes CBG done?: No Did pt. bring in CBG monitor from home?: No (phone visit)  How often do you need to have someone help you when you read instructions, pamphlets, or other written materials from your doctor  or pharmacy?: 1 - Never  Diabetes:  Is the patient diabetic?  Yes  If diabetic, was a CBG obtained today?  No  Did the patient bring in their glucometer from home?  No phone visit How often do you monitor your CBG's? never.   Financial Strains and Diabetes Management:  Are you having any financial strains with the device, your supplies or your medication? No .  Does the patient want to be seen by Chronic Care Management for management of their diabetes?  No  Would the patient like to be referred to a Nutritionist or for Diabetic Management?  No   Diabetic Exams:  Diabetic Eye Exam: . Overdue for diabetic eye exam. Pt has been advised about the importance in completing this  exam. Patient advised to make an appt.  Diabetic Foot Exam: Completed 09/08/2020.    Interpreter Needed?: No  Information entered by :: Caroleen Hamman LPN   Activities of Daily Living In your present state of health, do you have any difficulty performing the following activities: 07/13/2021  Hearing? N  Vision? N  Difficulty concentrating or making decisions? N  Walking or climbing stairs? N  Dressing or bathing? N  Doing errands, shopping? N  Preparing Food and eating ? N  Using the Toilet? N  In the past six months, have you accidently leaked urine? Y  Do you have problems with loss of bowel control? N  Managing your Medications? N  Managing your Finances? N  Housekeeping or managing your Housekeeping? N  Some recent data might be hidden    Patient Care Team: Copland, Gay Filler, MD as PCP - General (Family Medicine) Constance Haw, MD as PCP - Electrophysiology (Cardiology) Stanford Breed Denice Bors, MD as PCP - Cardiology (Cardiology)  Indicate any recent Medical Services you may have received from other than [facility] providers in the past year (date may be approximate).     Assessment:   This is a routine wellness examination for [address]  Hearing/Vision screen Hearing Screening - Comments:: C/o  mild hearing loss Vision Screening - Comments:: Last eye exam-over a year ago Reading glasses  Dietary issues and exercise activities discussed: Current Exercise Habits: The patient does not participate in regular exercise at present (walks a lot at work), Exercise limited by: None identified   Goals Addressed             This Visit's Progress    Patient Stated       Increase activity       Depression Screen PHQ 2/9 Scores 07/13/2021 01/27/2021 01/07/2018  PHQ - 2 Score 0 0 0    Fall Risk Fall Risk  07/13/2021 01/27/2021 01/07/2018  Falls in the past year? 0 0 No  Number falls in past yr: 0 - -  Injury with Fall? 0 - -  Follow up Falls prevention discussed - -    FALL RISK PREVENTION PERTAINING TO THE HOME:  Any stairs in or around the home? Yes  If so, are there any without handrails? No  Home free of loose throw rugs in walkways, pet beds, electrical cords, etc? Yes  Adequate lighting in your home to reduce risk of falls? Yes   ASSISTIVE DEVICES UTILIZED TO PREVENT FALLS:  Life alert? No  Use of a cane, walker or w/c? No  Grab bars in the bathroom? Yes  Shower chair or bench in shower? No  Elevated toilet seat or a handicapped toilet? No   TIMED UP AND GO:  Was the test performed? No . Phone visit   Cognitive Function:Normal cognitive status assessed by this Nurse Health Advisor. No abnormalities found.          Immunizations Immunization History  Administered Date(s) Administered   Fluad Quad(high Dose 65+) 06/10/2019, 09/08/2020   Influenza,inj,Quad PF,6+ Mos 10/11/2016   PFIZER Comirnaty(Gray Top)Covid-19 Tri-Sucrose Vaccine 02/10/2021   PFIZER(Purple Top)SARS-COV-2 Vaccination 10/25/2019, 11/19/2019, 06/08/2020   Pneumococcal Conjugate-13 08/06/2019   Pneumococcal Polysaccharide-23 10/27/2014   Tdap 01/11/2006, 09/08/2020   Zoster, Live 10/27/2014    TDAP status: Up to date  Flu Vaccine status: Due, Education has been provided regarding the  importance of this vaccine. Advised may receive this vaccine at local pharmacy or Health Dept. Aware to provide a copy of the vaccination  record if obtained from local pharmacy or Health Dept. Verbalized acceptance and understanding.  Pneumococcal vaccine status: Up to date  Covid-19 vaccine status: Information provided on how to obtain vaccines.   Qualifies for Shingles Vaccine? Yes   Zostavax completed No   Shingrix Completed?: No.    Education has been provided regarding the importance of this vaccine. Patient has been advised to call insurance company to determine out of pocket expense if they have not yet received this vaccine. Advised may also receive vaccine at local pharmacy or Health Dept. Verbalized acceptance and understanding.  Screening Tests Health Maintenance  Topic Date Due   OPHTHALMOLOGY EXAM  Never done   Zoster Vaccines- Shingrix (1 of 2) Never done   COVID-19 Vaccine (5 - Booster for Pfizer series) 04/07/2021   INFLUENZA VACCINE  04/11/2021   HEMOGLOBIN A1C  07/30/2021   FOOT EXAM  09/08/2021   COLONOSCOPY (Pts 45-47yrs Insurance coverage will need to be confirmed)  05/10/2022   TETANUS/TDAP  09/08/2030   Pneumonia Vaccine 52+ Years old  Completed   Hepatitis C Screening  Completed   HPV VACCINES  Aged Out    Health Maintenance  Health Maintenance Due  Topic Date Due   OPHTHALMOLOGY EXAM  Never done   Zoster Vaccines- Shingrix (1 of 2) Never done   COVID-19 Vaccine (5 - Booster for Pfizer series) 04/07/2021   INFLUENZA VACCINE  04/11/2021    Colorectal cancer screening: Type of screening: Colonoscopy. Completed 05/10/2021. Repeat every 1 years  Lung Cancer Screening: (Low Dose CT Chest recommended if Age 8-80 years, 30 pack-year currently smoking OR have quit w/in 15years.) does not qualify.     Additional Screening:  Hepatitis C Screening: Completed 08/06/2019  Vision Screening: Recommended annual ophthalmology exams for early detection of glaucoma  and other disorders of the eye. Is the patient up to date with their annual eye exam?  No  Who is the provider or what is the name of the office in which the patient attends annual eye exams? Pt unsure of name   Dental Screening: Recommended annual dental exams for proper oral hygiene  Community Resource Referral / Chronic Care Management: CRR required this visit?  No   CCM required this visit?  No      Plan:     I have personally reviewed and noted the following in the patient's chart:   Medical and social history Use of alcohol, tobacco or illicit drugs  Current medications and supplements including opioid prescriptions. Patient is not currently taking opioid prescriptions. Functional ability and status Nutritional status Physical activity Advanced directives List of other physicians Hospitalizations, surgeries, and ER visits in previous 12 months Vitals Screenings to include cognitive, depression, and falls Referrals and appointments  In addition, I have reviewed and discussed with patient certain preventive protocols, quality metrics, and best practice recommendations. A written personalized care plan for preventive services as well as general preventive health recommendations were provided to patient.   Due to this being a telephonic visit, the after visit summary with patients personalized plan was offered to patient via mail or my-chart.  Patient would like to access on my-chart.  Marta Antu, LPN   89/10/1192  Nurse Health Advisor  Nurse Notes: None

## 2021-08-27 ENCOUNTER — Other Ambulatory Visit: Payer: Self-pay | Admitting: Cardiology

## 2021-09-01 ENCOUNTER — Encounter: Payer: Self-pay | Admitting: Gastroenterology

## 2021-09-01 ENCOUNTER — Ambulatory Visit (INDEPENDENT_AMBULATORY_CARE_PROVIDER_SITE_OTHER): Payer: Medicare Other | Admitting: Gastroenterology

## 2021-09-01 VITALS — BP 110/60 | HR 64 | Ht 72.0 in | Wt 249.2 lb

## 2021-09-01 DIAGNOSIS — R195 Other fecal abnormalities: Secondary | ICD-10-CM

## 2021-09-01 DIAGNOSIS — Z86010 Personal history of colonic polyps: Secondary | ICD-10-CM

## 2021-09-01 DIAGNOSIS — Z7901 Long term (current) use of anticoagulants: Secondary | ICD-10-CM | POA: Diagnosis not present

## 2021-09-01 NOTE — Progress Notes (Signed)
HPI :  75 year old male here for a follow-up visit for heme positive stool.  Recall he has a history of A. fib on Eliquis, CHF with an EF of 40 to 45%, defibrillator/pacemaker in place.  He had positive fecal occult blood test this past June in the setting of mild anemia.  He endorses occasional bleeding from hemorrhoids from time to time, no dark stools.  He underwent a colonoscopy with me in August showing 14 polyps, mix of adenomas and sessile serrated polyps.  Was recommended to have repeat colonoscopy in 1 year.  He had his CBC repeated twice, most recently in September with a hemoglobin of 13.1, MCV of 87.  Iron studies drawn at that time and were normal.  He has never had an upper endoscopy and we discussed if he wanted to pursue that or not in light of the heme positive stool.  Again he does endorse rare bright red blood per rectum associated with hemorrhoids.  No diarrhea or constipation otherwise at baseline.  He denies any abdominal pains.  He does not have any frequent reflux, has rare symptoms with dietary indiscretion but does not take any PPI at baseline.  No dysphagia.  No nausea or vomiting.  He takes Aleve as needed for joint pains but does not take routinely.  He denies any family history of esophageal or gastric cancer, or colon cancer.  He has been stable in regards to his cardiovascular status recently.  He remains on Eliquis.  Prior workup: Colonoscopy 05/10/21 - - The examined portion of the ileum was normal. - Two 3 to 6 mm polyps in the cecum, removed with a cold snare. Resected and retrieved. - One 3 mm polyp in the ascending colon, removed with a cold snare. Resected and retrieved. - Four 3 to 7 mm polyps at the hepatic flexure, removed with a cold snare. Resected and retrieved. - Four 3 to 5 mm polyps in the transverse colon, removed with a cold snare. Resected and retrieved. - Two 3 to 4 mm polyps in the descending colon, removed with a cold snare. Resected  and retrieved. - One 4 mm polyp in the sigmoid colon, removed with a cold snare. Resected and retrieved. - Diverticulosis in the sigmoid colon. - Anal papilla(e) were hypertrophied. - Internal hemorrhoids. - The examination was otherwise normal.  Surgical [P], colon, sigmoid, descending, transverse, hepatic flexure, ascending, and cecum, polyp 14 - MULTIPLE FRAGMENTS OF TUBULAR ADENOMA. - MULTIPLE FRAGMENTS OF SESSILE SERRATED POLYP WITHOUT DYSPLASIA. - NO HIGH-GRADE DYSPLASIA OR MALIGNANCY.  Repeat colon in one year  FOBT (+) 02/11/21    Past Medical History:  Diagnosis Date   A-fib ([place])    CHF (congestive heart failure) (HCC)    Colon polyps    Essential hypertension    Hyperlipidemia    Non-ischemic cardiomyopathy (HCC)    Presence of combination internal cardiac defibrillator (ICD) and pacemaker    Prostate cancer ([place])    TIA (transient ischemic attack)      Past Surgical History:  Procedure Laterality Date   BACK SURGERY     COLONOSCOPY     about 10 years. Done in [place] or [place]    ICD IMPLANT  2012   PROSTATECTOMY     Family History  Problem Relation Age of Onset   Lung cancer Mother    Uterine cancer Mother    Lung cancer Father    Atrial fibrillation Sister    Lung cancer Brother  Colon cancer Neg Hx    Esophageal cancer Neg Hx    Pancreatic cancer Neg Hx    Liver disease Neg Hx    Stomach cancer Neg Hx    Rectal cancer Neg Hx    Social History   Tobacco Use   Smoking status: Former    Types: Cigarettes    Quit date: 09/12/1983    Years since quitting: 37.9    Passive exposure: Never   Smokeless tobacco: Never  Vaping Use   Vaping Use: Never used  Substance Use Topics   Alcohol use: Yes    Comment: very rare   Drug use: No   Current Outpatient Medications  Medication Sig Dispense Refill   amLODipine (NORVASC) 10 MG tablet Take 1 tablet (10 mg total) by mouth daily. 90 tablet 3   carvedilol (COREG) 25 MG tablet  TAKE 1 TABLET(25 MG) BY MOUTH TWICE DAILY 60 tablet 3   ELIQUIS 5 MG TABS tablet TAKE 1 TABLET(5 MG) BY MOUTH TWICE DAILY 180 tablet 1   fluticasone (FLONASE) 50 MCG/ACT nasal spray Place 2 sprays into both nostrils daily. (Patient taking differently: Place 2 sprays into both nostrils as needed.) 16 g 6   losartan (COZAAR) 100 MG tablet TAKE 1 TABLET(100 MG) BY MOUTH DAILY 90 tablet 2   metFORMIN (GLUCOPHAGE) 500 MG tablet Take 1 tablet (500 mg total) by mouth 2 (two) times daily with a meal. 180 tablet 3   Multiple Vitamin (MULTI VITAMIN DAILY PO) Take 1 tablet by mouth daily.     rosuvastatin (CRESTOR) 10 MG tablet Take 1 tablet (10 mg total) by mouth daily. 90 tablet 3   spironolactone (ALDACTONE) 25 MG tablet TAKE 1 TABLET(25 MG) BY MOUTH DAILY 90 tablet 1   No current facility-administered medications for this visit.   Allergies  Allergen Reactions   Lisinopril     Extreme diarrhea     Review of Systems: All systems reviewed and negative except where noted in HPI.   Lab Results  Component Value Date   WBC 6.9 05/27/2021   HGB 13.1 05/27/2021   HCT 38.8 (L) 05/27/2021   MCV 87.4 05/27/2021   PLT 215.0 05/27/2021    Lab Results  Component Value Date   CREATININE 1.00 01/27/2021   BUN 15 01/27/2021   NA 138 01/27/2021   K 4.3 01/27/2021   CL 102 01/27/2021   CO2 25 01/27/2021    Lab Results  Component Value Date   IRON 70 05/27/2021   TIBC 343 05/27/2021   FERRITIN 27 05/27/2021   CBC Latest Ref Rng & Units 05/27/2021 03/24/2021 01/27/2021  WBC 4.0 - 10.5 K/uL 6.9 7.2 6.9  Hemoglobin 13.0 - 17.0 g/dL 13.1 13.2 12.1(L)  Hematocrit 39.0 - 52.0 % 38.8(L) 38.8(L) 35.7(L)  Platelets 150.0 - 400.0 K/uL 215.0 241.0 328.0     Physical Exam: BP 110/60    Pulse 64    Ht 6' (1.829 m)    Wt 249 lb 3.2 oz (113 kg)    BMI 33.80 kg/m  Constitutional: Pleasant,well-developed, male in no acute distress. Neurological: Alert and oriented to person place and time. Psychiatric:  Normal mood and affect. Behavior is normal.  ASSESSMENT AND PLAN:  75 year old male here for reassessment of following:  Positive fecal occult blood test Anticoagulated History of colon polyps Internal hemorrhoids  Patient with a positive FOBT in the setting of a very mild anemia with hemoglobin of 12 back in May.  He had endorsed rare rectal bleeding  associated with hemorrhoids.  Colonoscopy showed 14 precancerous polyps and internal hemorrhoids but nothing concerning to cause the heme positive stool.  He has since had repeat CBC x2 with normal hemoglobin and normal iron studies.  He has no family history of GI tract malignancies.  He otherwise has no upper tract symptoms.  We discussed if he wanted to pursue an EGD to clear his upper tract in light of the heme positive stool.  We discussed risk benefits of endoscopy and anesthesia, weighing risks of the procedure versus benefits.  At this point time after full discussion he is not interested in pursuing upper endoscopy, hemorrhoids could certainly have caused the heme positive stool.  He will follow-up with his primary care for close monitoring of his blood counts and if he has recurrent anemia or iron deficiency he would be willing to pursue EGD but given his cardiovascular history wants to avoid endoscopy/anesthesia at this time which I think is reasonable.  Otherwise we will plan on repeating a colonoscopy in 1 year for surveillance of colon polyp.  He agreed.  Jolly Mango, MD [facility]  CC: Copland, Gay Filler, MD

## 2021-09-01 NOTE — Patient Instructions (Signed)
If you are age 74 or older, your body mass index should be between 23-30. Your Body mass index is 33.8 kg/m. If this is out of the aforementioned range listed, please consider follow up with your Primary Care Provider. ________________________________________________________  The [facility] providers would like to encourage you to use [facility] to communicate with providers for non-urgent requests or questions.  Due to long hold times on the telephone, sending your provider a message by [facility] may be a faster and more efficient way to get a response.  Please allow 48 business hours for a response.  Please remember that this is for non-urgent requests.  _______________________________________________________  Thank you for entrusting me with your care and choosing [facility].  Dr Havery Moros

## 2021-09-06 ENCOUNTER — Ambulatory Visit (INDEPENDENT_AMBULATORY_CARE_PROVIDER_SITE_OTHER): Payer: Medicare Other

## 2021-09-06 DIAGNOSIS — I428 Other cardiomyopathies: Secondary | ICD-10-CM | POA: Diagnosis not present

## 2021-09-06 LAB — CUP PACEART REMOTE DEVICE CHECK
Battery Remaining Longevity: 120 mo
Battery Remaining Percentage: 100 %
Brady Statistic RV Percent Paced: 5 %
Date Time Interrogation Session: 20221226040200
HighPow Impedance: 49 Ohm
Implantable Lead Implant Date: 20160308
Implantable Lead Location: 753860
Implantable Lead Model: 295
Implantable Lead Serial Number: 363165
Implantable Pulse Generator Implant Date: 20160308
Lead Channel Impedance Value: 372 Ohm
Lead Channel Pacing Threshold Amplitude: 0.8 V
Lead Channel Pacing Threshold Pulse Width: 0.5 ms
Lead Channel Setting Pacing Amplitude: 2.4 V
Lead Channel Setting Pacing Pulse Width: 0.5 ms
Lead Channel Setting Sensing Sensitivity: 0.6 mV
Pulse Gen Serial Number: 200269

## 2021-09-11 ENCOUNTER — Other Ambulatory Visit: Payer: Self-pay | Admitting: Family Medicine

## 2021-09-11 DIAGNOSIS — I10 Essential (primary) hypertension: Secondary | ICD-10-CM

## 2021-09-11 DIAGNOSIS — E119 Type 2 diabetes mellitus without complications: Secondary | ICD-10-CM

## 2021-09-16 NOTE — Progress Notes (Signed)
Remote ICD transmission.   

## 2021-09-23 ENCOUNTER — Other Ambulatory Visit: Payer: Self-pay | Admitting: Family Medicine

## 2021-09-23 DIAGNOSIS — E785 Hyperlipidemia, unspecified: Secondary | ICD-10-CM

## 2021-10-02 NOTE — Progress Notes (Signed)
[place] [facility] [address] [place], [place] [address] [phone]  Date:  10/05/2021   Name:  Bobby Bradley   DOB:  03/10/1946   MRN:  [id number]  PCP:  Bobby Mclean, MD    Chief Complaint: No chief complaint on file.   History of Present Illness:  Bobby Bradley is a 76 y.o. very pleasant male patient who presents with the following:  Patient is seen today for concern of feeling stiff/ lower back pain Virtual visit today Pt location is home, I am at office Pt ID confirmed with 2 factors, he gives consent for virtual visit today The pt and myself are present on the call today   Most recent visit with myself was in May of last year History of well-controlled diabetes, cardiomyopathy with ICD, TIA, A. fib on anticoagulation Eliquis, CHF, ICD in place, hypertension, prostate cancer s/p prostatectomy  He saw Dr. Stanford Breed, his primary cardiologist in August He also follows with electrophysiology, Dr. Curt Bears Over the fall he was bothered by blood in stool, has been seeing his gastroenterologist Dr. Havery Moros Most recent colonoscopy in August showed 61 polyps-plan to repeat in 1 year  Eye exam Foot exam due A1c Flu and COVID vaccines are up-to-date  Amlodipine Carvedilol Losartan Spironolactone 25 Crestor Metformin Eliquis  Lab Results  Component Value Date   HGBA1C 6.7 (H) 01/27/2021   Can update lab work  Due for PSA  Pt notes while he was taking down his xmas tree he developed some back pain- had to reach and lift in an extended position They then flew to [place] and back- this exacerbated his back further He has used flexeril in the past with success and would like an rx He was getting pain down his left leg - however this has now resolved No weakness or numbness in his legs  No bowel or bladder control changed   Patient Active Problem List   Diagnosis Date Noted   Bronchitis 08/17/2018    Controlled type 2 diabetes mellitus without complication, without long-term current use of insulin ([place]) 02/06/2018   Cardiomyopathy ([place]) 04/16/2017   History of TIA (transient ischemic attack) 04/16/2017   Chronic anticoagulation 04/16/2017   Paroxysmal atrial fibrillation ([place]) 32/99/2426   Chronic systolic congestive heart failure ([place]) 10/11/2016   ICD (implantable cardioverter-defibrillator) in place 10/11/2016   History of prostate cancer 10/11/2016   Essential hypertension 10/11/2016    Past Medical History:  Diagnosis Date   A-fib [facility])    CHF (congestive heart failure) ([place])    Colon polyps    Essential hypertension    Hyperlipidemia    Non-ischemic cardiomyopathy ([place])    Presence of combination internal cardiac defibrillator (ICD) and pacemaker    Prostate cancer ([place])    TIA (transient ischemic attack)     Past Surgical History:  Procedure Laterality Date   BACK SURGERY     COLONOSCOPY     about 10 years. Done in [place] or [place]    ICD IMPLANT  2012   PROSTATECTOMY      Social History   Tobacco Use   Smoking status: Former    Types: Cigarettes    Quit date: 09/12/1983    Years since quitting: 38.0    Passive exposure: Never   Smokeless tobacco: Never  Vaping Use   Vaping Use: Never used  Substance Use Topics   Alcohol use: Yes    Comment: very  rare   Drug use: No    Family History  Problem Relation Age of Onset   Lung cancer Mother    Uterine cancer Mother    Lung cancer Father    Atrial fibrillation Sister    Lung cancer Brother    Colon cancer Neg Hx    Esophageal cancer Neg Hx    Pancreatic cancer Neg Hx    Liver disease Neg Hx    Stomach cancer Neg Hx    Rectal cancer Neg Hx     Allergies  Allergen Reactions   Lisinopril     Extreme diarrhea    Medication list has been reviewed and updated.  Current Outpatient Medications on File Prior to Visit  Medication Sig Dispense Refill   amLODipine (NORVASC) 10 MG  tablet TAKE 1 TABLET(10 MG) BY MOUTH DAILY 90 tablet 3   carvedilol (COREG) 25 MG tablet TAKE 1 TABLET(25 MG) BY MOUTH TWICE DAILY 60 tablet 3   ELIQUIS 5 MG TABS tablet TAKE 1 TABLET(5 MG) BY MOUTH TWICE DAILY 180 tablet 1   fluticasone (FLONASE) 50 MCG/ACT nasal spray Place 2 sprays into both nostrils daily. (Patient taking differently: Place 2 sprays into both nostrils as needed.) 16 g 6   losartan (COZAAR) 100 MG tablet TAKE 1 TABLET(100 MG) BY MOUTH DAILY 90 tablet 2   metFORMIN (GLUCOPHAGE) 500 MG tablet TAKE 1 TABLET(500 MG) BY MOUTH TWICE DAILY WITH A MEAL 180 tablet 3   Multiple Vitamin (MULTI VITAMIN DAILY PO) Take 1 tablet by mouth daily.     rosuvastatin (CRESTOR) 10 MG tablet TAKE 1 TABLET(10 MG) BY MOUTH DAILY 30 tablet 0   spironolactone (ALDACTONE) 25 MG tablet TAKE 1 TABLET(25 MG) BY MOUTH DAILY 90 tablet 1   No current facility-administered medications on file prior to visit.    Review of Systems:  As per HPI- otherwise negative.   Physical Examination: There were no vitals filed for this visit. There were no vitals filed for this visit. There is no height or weight on file to calculate BMI. Ideal Body Weight:   BP was 118/62 GEN: No acute distress; alert,appropriate. PULM: Breathing comfortably in no respiratory distress PSYCH: Normally interactive.  Looks well- he indicated the lower back- more on the left- as the source of his pain   Assessment and Plan: Dyslipidemia - Plan: Lipid panel  Controlled type 2 diabetes mellitus without complication, without long-term current use of insulin (HCC) - Plan: Comprehensive metabolic panel, Hemoglobin A1c  Essential hypertension - Plan: CBC, Comprehensive metabolic panel  Chronic anticoagulation  Prostate cancer ([place]) - Plan: PSA  Mild anemia - Plan: CBC  Acute bilateral low back pain without sciatica - Plan: cyclobenzaprine (FLEXERIL) 10 MG tablet Virtual visit today- video use duration of visit  Flexeril to use  as needed for lower back pain Can use a 1/2 tablet if whole is too strong Do not use when driving Alert me if not better in the next few days  Ordered labs to do at his convenience   Signed Lamar Blinks, MD

## 2021-10-05 ENCOUNTER — Telehealth (INDEPENDENT_AMBULATORY_CARE_PROVIDER_SITE_OTHER): Payer: Medicare Other | Admitting: Family Medicine

## 2021-10-05 DIAGNOSIS — D649 Anemia, unspecified: Secondary | ICD-10-CM | POA: Diagnosis not present

## 2021-10-05 DIAGNOSIS — M545 Low back pain, unspecified: Secondary | ICD-10-CM

## 2021-10-05 DIAGNOSIS — C61 Malignant neoplasm of prostate: Secondary | ICD-10-CM

## 2021-10-05 DIAGNOSIS — E119 Type 2 diabetes mellitus without complications: Secondary | ICD-10-CM

## 2021-10-05 DIAGNOSIS — E785 Hyperlipidemia, unspecified: Secondary | ICD-10-CM

## 2021-10-05 DIAGNOSIS — I10 Essential (primary) hypertension: Secondary | ICD-10-CM | POA: Diagnosis not present

## 2021-10-05 DIAGNOSIS — Z7901 Long term (current) use of anticoagulants: Secondary | ICD-10-CM

## 2021-10-05 MED ORDER — CYCLOBENZAPRINE HCL 10 MG PO TABS
10.0000 mg | ORAL_TABLET | Freq: Two times a day (BID) | ORAL | 0 refills | Status: DC | PRN
Start: 2021-10-05 — End: 2022-02-17

## 2021-10-14 NOTE — Progress Notes (Signed)
HPI:FU atrial fibrillation and CHF. Also with prior ICD. Previously moved from [place]. Patient apparently had significant ectopy in the past and had catheterization revealing no coronary disease. He later had a TIA and was diagnosed with a cardiomyopathy and paroxysmal atrial fibrillation. Abdominal ultrasound February 2018 showed no aneurysm. Echocardiogram March 2022 which showed ejection fraction 40 to 45%, mild left ventricular hypertrophy.  Since last seen the patient has dyspnea with more extreme activities but not with routine activities. It is relieved with rest. It is not associated with chest pain. There is no orthopnea, PND or pedal edema. There is no syncope or palpitations. There is no exertional chest pain.   Current Outpatient Medications  Medication Sig Dispense Refill   amLODipine (NORVASC) 10 MG tablet TAKE 1 TABLET(10 MG) BY MOUTH DAILY 90 tablet 3   carvedilol (COREG) 25 MG tablet TAKE 1 TABLET(25 MG) BY MOUTH TWICE DAILY 60 tablet 3   cyclobenzaprine (FLEXERIL) 10 MG tablet Take 1 tablet (10 mg total) by mouth 2 (two) times daily as needed for muscle spasms. 30 tablet 0   ELIQUIS 5 MG TABS tablet TAKE 1 TABLET(5 MG) BY MOUTH TWICE DAILY 180 tablet 1   fluticasone (FLONASE) 50 MCG/ACT nasal spray Place 2 sprays into both nostrils daily. (Patient taking differently: Place 2 sprays into both nostrils as needed.) 16 g 6   losartan (COZAAR) 100 MG tablet TAKE 1 TABLET(100 MG) BY MOUTH DAILY 90 tablet 2   metFORMIN (GLUCOPHAGE) 500 MG tablet TAKE 1 TABLET(500 MG) BY MOUTH TWICE DAILY WITH A MEAL 180 tablet 3   Multiple Vitamin (MULTI VITAMIN DAILY PO) Take 1 tablet by mouth daily.     rosuvastatin (CRESTOR) 10 MG tablet TAKE 1 TABLET(10 MG) BY MOUTH DAILY 30 tablet 0   spironolactone (ALDACTONE) 25 MG tablet TAKE 1 TABLET(25 MG) BY MOUTH DAILY 90 tablet 1   No current facility-administered medications for this visit.     Past Medical History:  Diagnosis Date   A-fib  [facility])    CHF (congestive heart failure) (HCC)    Colon polyps    Essential hypertension    Hyperlipidemia    Non-ischemic cardiomyopathy (HCC)    Presence of combination internal cardiac defibrillator (ICD) and pacemaker    Prostate cancer ([place])    TIA (transient ischemic attack)     Past Surgical History:  Procedure Laterality Date   BACK SURGERY     COLONOSCOPY     about 10 years. Done in [place] or [place]    ICD IMPLANT  2012   PROSTATECTOMY      Social History   Socioeconomic History   Marital status: Married    Spouse name: Not on file   Number of children: 7   Years of education: Not on file   Highest education level: Not on file  Occupational History   Occupation: Art therapist   Tobacco Use   Smoking status: Former    Types: Cigarettes    Quit date: 09/12/1983    Years since quitting: 38.1    Passive exposure: Never   Smokeless tobacco: Never  Vaping Use   Vaping Use: Never used  Substance and Sexual Activity   Alcohol use: Yes    Comment: very rare   Drug use: No   Sexual activity: Not Currently  Other Topics Concern   Not on file  Social History Narrative   Not on file   Social Determinants of Health  Financial Resource Strain: Low Risk    Difficulty of Paying Living Expenses: Not hard at all  Food Insecurity: No Food Insecurity   Worried About Charity fundraiser in the Last Year: Never true   Ran Out of Food in the Last Year: Never true  Transportation Needs: No Transportation Needs   Lack of Transportation (Medical): No   Lack of Transportation (Non-Medical): No  Physical Activity: Inactive   Days of Exercise per Week: 0 days   Minutes of Exercise per Session: 0 min  Stress: No Stress Concern Present   Feeling of Stress : Not at all  Social Connections: Socially Integrated   Frequency of Communication with Friends and Family: More than three times a week   Frequency of Social Gatherings with Friends and  Family: More than three times a week   Attends Religious Services: More than 4 times per year   Active Member of [employer] or Organizations: Yes   Attends Music therapist: More than 4 times per year   Marital Status: Married  Human resources officer Violence: Not At Risk   Fear of Current or Ex-Partner: No   Emotionally Abused: No   Physically Abused: No   Sexually Abused: No    Family History  Problem Relation Age of Onset   Lung cancer Mother    Uterine cancer Mother    Lung cancer Father    Atrial fibrillation Sister    Lung cancer Brother    Colon cancer Neg Hx    Esophageal cancer Neg Hx    Pancreatic cancer Neg Hx    Liver disease Neg Hx    Stomach cancer Neg Hx    Rectal cancer Neg Hx     ROS: no fevers or chills, productive cough, hemoptysis, dysphasia, odynophagia, melena, hematochezia, dysuria, hematuria, rash, seizure activity, orthopnea, PND, pedal edema, claudication. Remaining systems are negative.  Physical Exam: Well-developed well-nourished in no acute distress.  Skin is warm and dry.  HEENT is normal.  Neck is supple.  Chest is clear to auscultation with normal expansion.  Cardiovascular exam is regular rate and rhythm.  Abdominal exam nontender or distended. No masses palpated. Extremities show no edema. neuro grossly intact  ECG-sinus rhythm with with first-degree AV block, occasional PVC and ventricular paced V, normal axis, nonspecific ST changes.  Personally reviewed  A/P  1 paroxysmal atrial fibrillation-patient is in sinus rhythm today.  We will continue beta-blocker and apixaban.  2 hypertension-patient's blood pressure is controlled.  Continue present medications.  3 nonischemic cardiomyopathy-LV function mildly reduced on most recent echocardiogram.  Continue ARB, beta-blocker and spironolactone.  4 ICD-management per electrophysiology.  Kirk Ruths, MD

## 2021-10-25 ENCOUNTER — Other Ambulatory Visit: Payer: Self-pay | Admitting: Cardiology

## 2021-10-25 ENCOUNTER — Other Ambulatory Visit: Payer: Self-pay | Admitting: Family Medicine

## 2021-10-25 DIAGNOSIS — E785 Hyperlipidemia, unspecified: Secondary | ICD-10-CM

## 2021-10-25 NOTE — Telephone Encounter (Signed)
Prescription refill request for Eliquis received. Indication: afib  Last office visit: [place], 05/09/2021 Scr: 1.00, 01/27/2021 Age: 76 yo  Weight: 113 kg   Refill sent.

## 2021-10-26 ENCOUNTER — Other Ambulatory Visit (INDEPENDENT_AMBULATORY_CARE_PROVIDER_SITE_OTHER): Payer: 59

## 2021-10-26 ENCOUNTER — Other Ambulatory Visit: Payer: Self-pay

## 2021-10-26 ENCOUNTER — Ambulatory Visit (INDEPENDENT_AMBULATORY_CARE_PROVIDER_SITE_OTHER): Payer: 59 | Admitting: Cardiology

## 2021-10-26 ENCOUNTER — Encounter: Payer: Self-pay | Admitting: Cardiology

## 2021-10-26 VITALS — BP 112/60 | HR 63 | Ht 72.0 in | Wt 251.0 lb

## 2021-10-26 DIAGNOSIS — I10 Essential (primary) hypertension: Secondary | ICD-10-CM | POA: Diagnosis not present

## 2021-10-26 DIAGNOSIS — D649 Anemia, unspecified: Secondary | ICD-10-CM | POA: Diagnosis not present

## 2021-10-26 DIAGNOSIS — I480 Paroxysmal atrial fibrillation: Secondary | ICD-10-CM

## 2021-10-26 DIAGNOSIS — I428 Other cardiomyopathies: Secondary | ICD-10-CM | POA: Diagnosis not present

## 2021-10-26 DIAGNOSIS — E119 Type 2 diabetes mellitus without complications: Secondary | ICD-10-CM

## 2021-10-26 DIAGNOSIS — Z95810 Presence of automatic (implantable) cardiac defibrillator: Secondary | ICD-10-CM

## 2021-10-26 DIAGNOSIS — C61 Malignant neoplasm of prostate: Secondary | ICD-10-CM

## 2021-10-26 DIAGNOSIS — E785 Hyperlipidemia, unspecified: Secondary | ICD-10-CM

## 2021-10-26 LAB — CBC
HCT: 39.6 % (ref 39.0–52.0)
Hemoglobin: 13.4 g/dL (ref 13.0–17.0)
MCHC: 33.9 g/dL (ref 30.0–36.0)
MCV: 87.8 fl (ref 78.0–100.0)
Platelets: 245 10*3/uL (ref 150.0–400.0)
RBC: 4.51 Mil/uL (ref 4.22–5.81)
RDW: 13.9 % (ref 11.5–15.5)
WBC: 7.8 10*3/uL (ref 4.0–10.5)

## 2021-10-26 LAB — PSA: PSA: 0.02 ng/mL — ABNORMAL LOW (ref 0.10–4.00)

## 2021-10-26 LAB — HEMOGLOBIN A1C: Hgb A1c MFr Bld: 6.8 % — ABNORMAL HIGH (ref 4.6–6.5)

## 2021-10-26 MED ORDER — APIXABAN 5 MG PO TABS: ORAL_TABLET | ORAL | 3 refills | Status: DC

## 2021-10-26 MED ORDER — ROSUVASTATIN CALCIUM 10 MG PO TABS: ORAL_TABLET | ORAL | 3 refills | Status: DC

## 2021-10-26 MED ORDER — LOSARTAN POTASSIUM 100 MG PO TABS: ORAL_TABLET | ORAL | 3 refills | Status: DC

## 2021-10-26 MED ORDER — SPIRONOLACTONE 25 MG PO TABS: 25.0000 mg | ORAL_TABLET | Freq: Every day | ORAL | 3 refills | Status: DC

## 2021-10-26 MED ORDER — CARVEDILOL 25 MG PO TABS: ORAL_TABLET | ORAL | 3 refills | Status: DC

## 2021-10-26 MED ORDER — AMLODIPINE BESYLATE 10 MG PO TABS: ORAL_TABLET | ORAL | 3 refills | Status: DC

## 2021-10-26 NOTE — Patient Instructions (Signed)

## 2021-10-27 ENCOUNTER — Encounter: Payer: Self-pay | Admitting: Family Medicine

## 2021-10-27 DIAGNOSIS — E785 Hyperlipidemia, unspecified: Secondary | ICD-10-CM

## 2021-10-27 LAB — LIPID PANEL
Cholesterol: 139 mg/dL (ref 0–200)
HDL: 34 mg/dL — ABNORMAL LOW (ref >39.00)
NonHDL: 105.37
Total CHOL/HDL Ratio: 4
Triglycerides: 285 mg/dL — ABNORMAL HIGH (ref 0.0–149.0)
VLDL: 57 mg/dL — ABNORMAL HIGH (ref 0.0–40.0)

## 2021-10-27 LAB — COMPREHENSIVE METABOLIC PANEL
ALT: 19 U/L (ref 0–53)
AST: 15 U/L (ref 0–37)
Albumin: 4.5 g/dL (ref 3.5–5.2)
Alkaline Phosphatase: 81 U/L (ref 39–117)
BUN: 16 mg/dL (ref 6–23)
CO2: 26 mEq/L (ref 19–32)
Calcium: 9.9 mg/dL (ref 8.4–10.5)
Chloride: 102 mEq/L (ref 96–112)
Creatinine, Ser: 1.04 mg/dL (ref 0.40–1.50)
GFR: 70.82 mL/min (ref >60.00)
Glucose, Bld: 120 mg/dL — ABNORMAL HIGH (ref 70–99)
Potassium: 4.6 mEq/L (ref 3.5–5.1)
Sodium: 136 mEq/L (ref 135–145)
Total Bilirubin: 0.5 mg/dL (ref 0.2–1.2)
Total Protein: 7.8 g/dL (ref 6.0–8.3)

## 2021-10-27 LAB — LDL CHOLESTEROL, DIRECT: Direct LDL: 66 mg/dL

## 2021-10-27 NOTE — Progress Notes (Signed)
Received patient labs as below, message to patient He is status post prostatectomy for prostate cancer  Lab Results  Component Value Date   PSA 0.02 (L) 10/26/2021   PSA 0.01 (L) 09/08/2020  For males who have undergone radical prostatectomy for localized prostate cancer, the most widely accepted criterion for biochemical recurrence is that of the American Urological Association (AUA) [1]. According to AUA guidelines, a biochemical recurrence is defined as a serum PSA ?0.2 ng/mL, which is confirmed by a second determination    Results for orders placed or performed in visit on 10/26/21  PSA  Result Value Ref Range   PSA 0.02 (L) 0.10 - 4.00 ng/mL  Lipid panel  Result Value Ref Range   Cholesterol 139 0 - 200 mg/dL   Triglycerides 285.0 (H) 0.0 - 149.0 mg/dL   HDL 34.00 (L) >39.00 mg/dL   VLDL 57.0 (H) 0.0 - 40.0 mg/dL   Total CHOL/HDL Ratio 4    NonHDL 105.37   Hemoglobin A1c  Result Value Ref Range   Hgb A1c MFr Bld 6.8 (H) 4.6 - 6.5 %  Comprehensive metabolic panel  Result Value Ref Range   Sodium 136 135 - 145 mEq/L   Potassium 4.6 3.5 - 5.1 mEq/L   Chloride 102 96 - 112 mEq/L   CO2 26 19 - 32 mEq/L   Glucose, Bld 120 (H) 70 - 99 mg/dL   BUN 16 6 - 23 mg/dL   Creatinine, Ser 1.04 0.40 - 1.50 mg/dL   Total Bilirubin 0.5 0.2 - 1.2 mg/dL   Alkaline Phosphatase 81 39 - 117 U/L   AST 15 0 - 37 U/L   ALT 19 0 - 53 U/L   Total Protein 7.8 6.0 - 8.3 g/dL   Albumin 4.5 3.5 - 5.2 g/dL   GFR 70.82 >60.00 mL/min   Calcium 9.9 8.4 - 10.5 mg/dL  CBC  Result Value Ref Range   WBC 7.8 4.0 - 10.5 K/uL   RBC 4.51 4.22 - 5.81 Mil/uL   Platelets 245.0 150.0 - 400.0 K/uL   Hemoglobin 13.4 13.0 - 17.0 g/dL   HCT 39.6 39.0 - 52.0 %   MCV 87.8 78.0 - 100.0 fl   MCHC 33.9 30.0 - 36.0 g/dL   RDW 13.9 11.5 - 15.5 %  LDL cholesterol, direct  Result Value Ref Range   Direct LDL 66.0 mg/dL

## 2021-10-28 MED ORDER — ROSUVASTATIN CALCIUM 20 MG PO TABS: ORAL_TABLET | ORAL | 3 refills | Status: DC

## 2021-12-05 ENCOUNTER — Ambulatory Visit (INDEPENDENT_AMBULATORY_CARE_PROVIDER_SITE_OTHER): Payer: Medicare Other

## 2021-12-05 DIAGNOSIS — I480 Paroxysmal atrial fibrillation: Secondary | ICD-10-CM

## 2021-12-07 LAB — CUP PACEART REMOTE DEVICE CHECK
Battery Remaining Longevity: 114 mo
Battery Remaining Percentage: 100 %
Brady Statistic RV Percent Paced: 5 %
Date Time Interrogation Session: 20230327041400
HighPow Impedance: 53 Ohm
Implantable Lead Implant Date: 20160308
Implantable Lead Location: 753860
Implantable Lead Model: 295
Implantable Lead Serial Number: 363165
Implantable Pulse Generator Implant Date: 20160308
Lead Channel Impedance Value: 380 Ohm
Lead Channel Pacing Threshold Amplitude: 0.8 V
Lead Channel Pacing Threshold Pulse Width: 0.5 ms
Lead Channel Setting Pacing Amplitude: 2.4 V
Lead Channel Setting Pacing Pulse Width: 0.5 ms
Lead Channel Setting Sensing Sensitivity: 0.6 mV
Pulse Gen Serial Number: 200269

## 2021-12-14 NOTE — Progress Notes (Signed)
Remote ICD transmission.   

## 2022-02-17 ENCOUNTER — Ambulatory Visit (INDEPENDENT_AMBULATORY_CARE_PROVIDER_SITE_OTHER): Payer: Medicare Other | Admitting: Family

## 2022-02-17 ENCOUNTER — Encounter: Payer: Self-pay | Admitting: Family

## 2022-02-17 ENCOUNTER — Ambulatory Visit (HOSPITAL_BASED_OUTPATIENT_CLINIC_OR_DEPARTMENT_OTHER)
Admission: RE | Admit: 2022-02-17 | Discharge: 2022-02-17 | Disposition: A | Discharge status: Home / Self Care | Payer: Medicare Other | Source: Ambulatory Visit | Attending: Family | Admitting: Family

## 2022-02-17 DIAGNOSIS — M545 Low back pain, unspecified: Secondary | ICD-10-CM

## 2022-02-17 DIAGNOSIS — M25552 Pain in left hip: Secondary | ICD-10-CM | POA: Diagnosis not present

## 2022-02-17 MED ORDER — PREDNISONE 20 MG PO TABS: ORAL_TABLET | ORAL | 0 refills | Status: DC

## 2022-02-17 MED ORDER — CYCLOBENZAPRINE HCL 10 MG PO TABS: 10.0000 mg | ORAL_TABLET | Freq: Two times a day (BID) | ORAL | 1 refills | Status: DC | PRN

## 2022-02-17 NOTE — Progress Notes (Signed)
Bobby Bradley is a 76 y.o. male with the following history as recorded in Epic[employer]:  Patient Active Problem List   Diagnosis Date Noted   Bronchitis 08/17/2018   Controlled type 2 diabetes mellitus without complication, without long-term current use of insulin ([place]) 02/06/2018   Cardiomyopathy ([place]) 04/16/2017   History of TIA (transient ischemic attack) 04/16/2017   Chronic anticoagulation 04/16/2017   Paroxysmal atrial fibrillation (HCC) 18/29/9371   Chronic systolic congestive heart failure ([place]) 10/11/2016   ICD (implantable cardioverter-defibrillator) in place 10/11/2016   History of prostate cancer 10/11/2016   Essential hypertension 10/11/2016    Current Outpatient Medications  Medication Sig Dispense Refill   amLODipine (NORVASC) 10 MG tablet TAKE 1 TABLET(10 MG) BY MOUTH DAILY 90 tablet 3   apixaban (ELIQUIS) 5 MG TABS tablet TAKE 1 TABLET(5 MG) BY MOUTH TWICE DAILY 180 tablet 3   carvedilol (COREG) 25 MG tablet TAKE 1 TABLET(25 MG) BY MOUTH TWICE DAILY 180 tablet 3   fluticasone (FLONASE) 50 MCG/ACT nasal spray Place 2 sprays into both nostrils daily. (Patient taking differently: Place 2 sprays into both nostrils as needed.) 16 g 6   losartan (COZAAR) 100 MG tablet TAKE 1 TABLET(100 MG) BY MOUTH DAILY 90 tablet 3   metFORMIN (GLUCOPHAGE) 500 MG tablet TAKE 1 TABLET(500 MG) BY MOUTH TWICE DAILY WITH A MEAL 180 tablet 3   Multiple Vitamin (MULTI VITAMIN DAILY PO) Take 1 tablet by mouth daily.     predniSONE (DELTASONE) 20 MG tablet Take 2 tablets x 2 days, then 1 tablet x 5 days 9 tablet 0   rosuvastatin (CRESTOR) 20 MG tablet TAKE 1 TABLET(20 MG) BY MOUTH DAILY 90 tablet 3   spironolactone (ALDACTONE) 25 MG tablet Take 1 tablet (25 mg total) by mouth daily. 90 tablet 3   cyclobenzaprine (FLEXERIL) 10 MG tablet Take 1 tablet (10 mg total) by mouth 2 (two) times daily as needed for muscle spasms. 60 tablet 1   No current facility-administered medications for this visit.     Allergies: Lisinopril  Past Medical History:  Diagnosis Date   A-fib ([place])    CHF (congestive heart failure) (HCC)    Colon polyps    Essential hypertension    Hyperlipidemia    Non-ischemic cardiomyopathy (HCC)    Presence of combination internal cardiac defibrillator (ICD) and pacemaker    Prostate cancer ([place])    TIA (transient ischemic attack)     Past Surgical History:  Procedure Laterality Date   BACK SURGERY     COLONOSCOPY     about 10 years. Done in [place] or [place]    ICD IMPLANT  2012   PROSTATECTOMY      Family History  Problem Relation Age of Onset   Lung cancer Mother    Uterine cancer Mother    Lung cancer Father    Atrial fibrillation Sister    Lung cancer Brother    Colon cancer Neg Hx    Esophageal cancer Neg Hx    Pancreatic cancer Neg Hx    Liver disease Neg Hx    Stomach cancer Neg Hx    Rectal cancer Neg Hx     Social History   Tobacco Use   Smoking status: Former    Types: Cigarettes    Quit date: 09/12/1983    Years since quitting: 38.4    Passive exposure: Never   Smokeless tobacco: Never  Substance Use Topics   Alcohol use: Yes    Comment: very rare  Subjective:  Low back pain x 2 weeks/ left leg pain; feels pain radiating into left leg; history of chronic back issues; feels that symptoms started after sleeping in hotel room; has been taking Flexeril with limited benefit.      Objective:  Vitals:   02/17/22 1023  BP: 136/70  Pulse: 62  Resp: 20  SpO2: 96%  Weight: 244 lb (110.7 kg)  Height: 6' (1.829 m)    General: Well developed, well nourished, in no acute distress  Skin : Warm and dry.  Head: Normocephalic and atraumatic  Lungs: Respirations unlabored;  Musculoskeletal: No deformities; no active joint inflammation  Extremities: No edema, cyanosis, clubbing  Vessels: Symmetric bilaterally  Neurologic: Alert and oriented; speech intact; face symmetrical; moves all extremities well; CNII-XII intact  without focal deficit   Assessment:  1. Acute bilateral low back pain without sciatica     Plan:  Update lumbar X-ray and left hip X -ray; Rx for Prednisone- taper as directed; refill on Flexeril; to consider need for MRI and/or specialty follow up or referral to PT;   No follow-ups on file.  Orders Placed This Encounter  Procedures   DG Lumbar Spine Complete    Standing Status:   Future    Number of Occurrences:   1    Standing Expiration Date:   02/18/2023    Order Specific Question:   Reason for Exam (SYMPTOM  OR DIAGNOSIS REQUIRED)    Answer:   low back pain with radiculopathy    Order Specific Question:   Preferred imaging location?    Answer:   [facility]   DG Hip Unilat W OR W/O Pelvis 2-3 Views Left    Standing Status:   Future    Number of Occurrences:   1    Standing Expiration Date:   02/18/2023    Order Specific Question:   Reason for Exam (SYMPTOM  OR DIAGNOSIS REQUIRED)    Answer:   left hip pain    Order Specific Question:   Preferred imaging location?    Answer:   [facility]    Requested Prescriptions   Signed Prescriptions Disp Refills   cyclobenzaprine (FLEXERIL) 10 MG tablet 60 tablet 1    Sig: Take 1 tablet (10 mg total) by mouth 2 (two) times daily as needed for muscle spasms.   predniSONE (DELTASONE) 20 MG tablet 9 tablet 0    Sig: Take 2 tablets x 2 days, then 1 tablet x 5 days

## 2022-02-21 ENCOUNTER — Ambulatory Visit: Payer: Medicare Other | Admitting: Family

## 2022-03-06 ENCOUNTER — Ambulatory Visit (INDEPENDENT_AMBULATORY_CARE_PROVIDER_SITE_OTHER): Payer: Medicare Other

## 2022-03-06 DIAGNOSIS — I428 Other cardiomyopathies: Secondary | ICD-10-CM

## 2022-03-10 LAB — CUP PACEART REMOTE DEVICE CHECK
Battery Remaining Longevity: 114 mo
Battery Remaining Percentage: 100 %
Brady Statistic RV Percent Paced: 5 %
Date Time Interrogation Session: 20230629191000
HighPow Impedance: 49 Ohm
Implantable Lead Implant Date: 20160308
Implantable Lead Location: 753860
Implantable Lead Model: 295
Implantable Lead Serial Number: 363165
Implantable Pulse Generator Implant Date: 20160308
Lead Channel Impedance Value: 376 Ohm
Lead Channel Pacing Threshold Amplitude: 0.8 V
Lead Channel Pacing Threshold Pulse Width: 0.5 ms
Lead Channel Setting Pacing Amplitude: 2.4 V
Lead Channel Setting Pacing Pulse Width: 0.5 ms
Lead Channel Setting Sensing Sensitivity: 0.6 mV
Pulse Gen Serial Number: 200269

## 2022-03-13 ENCOUNTER — Encounter: Payer: Self-pay | Admitting: Family

## 2022-03-13 ENCOUNTER — Other Ambulatory Visit: Payer: Self-pay | Admitting: Family

## 2022-03-13 DIAGNOSIS — G8929 Other chronic pain: Secondary | ICD-10-CM

## 2022-03-20 ENCOUNTER — Other Ambulatory Visit: Payer: Self-pay | Admitting: Specialist

## 2022-03-20 ENCOUNTER — Other Ambulatory Visit (HOSPITAL_COMMUNITY): Payer: Self-pay | Admitting: Specialist

## 2022-03-20 DIAGNOSIS — Z7901 Long term (current) use of anticoagulants: Secondary | ICD-10-CM | POA: Diagnosis not present

## 2022-03-20 DIAGNOSIS — M5451 Vertebrogenic low back pain: Secondary | ICD-10-CM | POA: Diagnosis not present

## 2022-03-20 DIAGNOSIS — E119 Type 2 diabetes mellitus without complications: Secondary | ICD-10-CM | POA: Diagnosis not present

## 2022-03-20 DIAGNOSIS — M5459 Other low back pain: Secondary | ICD-10-CM

## 2022-03-20 DIAGNOSIS — M5136 Other intervertebral disc degeneration, lumbar region: Secondary | ICD-10-CM | POA: Diagnosis not present

## 2022-03-20 DIAGNOSIS — M48061 Spinal stenosis, lumbar region without neurogenic claudication: Secondary | ICD-10-CM | POA: Diagnosis not present

## 2022-03-30 DIAGNOSIS — M5459 Other low back pain: Secondary | ICD-10-CM | POA: Diagnosis not present

## 2022-03-30 DIAGNOSIS — M6281 Muscle weakness (generalized): Secondary | ICD-10-CM | POA: Diagnosis not present

## 2022-03-30 DIAGNOSIS — R2689 Other abnormalities of gait and mobility: Secondary | ICD-10-CM | POA: Diagnosis not present

## 2022-03-30 DIAGNOSIS — M2560 Stiffness of unspecified joint, not elsewhere classified: Secondary | ICD-10-CM | POA: Diagnosis not present

## 2022-03-31 NOTE — Progress Notes (Signed)
Remote ICD transmission.   

## 2022-04-03 DIAGNOSIS — M2560 Stiffness of unspecified joint, not elsewhere classified: Secondary | ICD-10-CM | POA: Diagnosis not present

## 2022-04-03 DIAGNOSIS — R2689 Other abnormalities of gait and mobility: Secondary | ICD-10-CM | POA: Diagnosis not present

## 2022-04-03 DIAGNOSIS — M6281 Muscle weakness (generalized): Secondary | ICD-10-CM | POA: Diagnosis not present

## 2022-04-03 DIAGNOSIS — M5459 Other low back pain: Secondary | ICD-10-CM | POA: Diagnosis not present

## 2022-04-07 DIAGNOSIS — M6281 Muscle weakness (generalized): Secondary | ICD-10-CM | POA: Diagnosis not present

## 2022-04-07 DIAGNOSIS — M2560 Stiffness of unspecified joint, not elsewhere classified: Secondary | ICD-10-CM | POA: Diagnosis not present

## 2022-04-07 DIAGNOSIS — R2689 Other abnormalities of gait and mobility: Secondary | ICD-10-CM | POA: Diagnosis not present

## 2022-04-07 DIAGNOSIS — M5459 Other low back pain: Secondary | ICD-10-CM | POA: Diagnosis not present

## 2022-04-11 DIAGNOSIS — R2689 Other abnormalities of gait and mobility: Secondary | ICD-10-CM | POA: Diagnosis not present

## 2022-04-11 DIAGNOSIS — M2560 Stiffness of unspecified joint, not elsewhere classified: Secondary | ICD-10-CM | POA: Diagnosis not present

## 2022-04-11 DIAGNOSIS — M5459 Other low back pain: Secondary | ICD-10-CM | POA: Diagnosis not present

## 2022-04-11 DIAGNOSIS — M6281 Muscle weakness (generalized): Secondary | ICD-10-CM | POA: Diagnosis not present

## 2022-04-14 DIAGNOSIS — M5459 Other low back pain: Secondary | ICD-10-CM | POA: Diagnosis not present

## 2022-04-14 DIAGNOSIS — M2560 Stiffness of unspecified joint, not elsewhere classified: Secondary | ICD-10-CM | POA: Diagnosis not present

## 2022-04-14 DIAGNOSIS — R2689 Other abnormalities of gait and mobility: Secondary | ICD-10-CM | POA: Diagnosis not present

## 2022-04-14 DIAGNOSIS — M6281 Muscle weakness (generalized): Secondary | ICD-10-CM | POA: Diagnosis not present

## 2022-04-18 DIAGNOSIS — R2689 Other abnormalities of gait and mobility: Secondary | ICD-10-CM | POA: Diagnosis not present

## 2022-04-18 DIAGNOSIS — M2560 Stiffness of unspecified joint, not elsewhere classified: Secondary | ICD-10-CM | POA: Diagnosis not present

## 2022-04-18 DIAGNOSIS — M6281 Muscle weakness (generalized): Secondary | ICD-10-CM | POA: Diagnosis not present

## 2022-04-18 DIAGNOSIS — M5459 Other low back pain: Secondary | ICD-10-CM | POA: Diagnosis not present

## 2022-04-20 DIAGNOSIS — M6281 Muscle weakness (generalized): Secondary | ICD-10-CM | POA: Diagnosis not present

## 2022-04-20 DIAGNOSIS — M5459 Other low back pain: Secondary | ICD-10-CM | POA: Diagnosis not present

## 2022-04-20 DIAGNOSIS — M2560 Stiffness of unspecified joint, not elsewhere classified: Secondary | ICD-10-CM | POA: Diagnosis not present

## 2022-04-20 DIAGNOSIS — R2689 Other abnormalities of gait and mobility: Secondary | ICD-10-CM | POA: Diagnosis not present

## 2022-04-25 DIAGNOSIS — M2560 Stiffness of unspecified joint, not elsewhere classified: Secondary | ICD-10-CM | POA: Diagnosis not present

## 2022-04-25 DIAGNOSIS — R2689 Other abnormalities of gait and mobility: Secondary | ICD-10-CM | POA: Diagnosis not present

## 2022-04-25 DIAGNOSIS — M6281 Muscle weakness (generalized): Secondary | ICD-10-CM | POA: Diagnosis not present

## 2022-04-25 DIAGNOSIS — M5459 Other low back pain: Secondary | ICD-10-CM | POA: Diagnosis not present

## 2022-04-28 DIAGNOSIS — M5459 Other low back pain: Secondary | ICD-10-CM | POA: Diagnosis not present

## 2022-04-28 DIAGNOSIS — M2560 Stiffness of unspecified joint, not elsewhere classified: Secondary | ICD-10-CM | POA: Diagnosis not present

## 2022-04-28 DIAGNOSIS — R2689 Other abnormalities of gait and mobility: Secondary | ICD-10-CM | POA: Diagnosis not present

## 2022-04-28 DIAGNOSIS — M6281 Muscle weakness (generalized): Secondary | ICD-10-CM | POA: Diagnosis not present

## 2022-05-02 DIAGNOSIS — R2689 Other abnormalities of gait and mobility: Secondary | ICD-10-CM | POA: Diagnosis not present

## 2022-05-02 DIAGNOSIS — M6281 Muscle weakness (generalized): Secondary | ICD-10-CM | POA: Diagnosis not present

## 2022-05-02 DIAGNOSIS — M5459 Other low back pain: Secondary | ICD-10-CM | POA: Diagnosis not present

## 2022-05-02 DIAGNOSIS — M2560 Stiffness of unspecified joint, not elsewhere classified: Secondary | ICD-10-CM | POA: Diagnosis not present

## 2022-05-05 DIAGNOSIS — M6281 Muscle weakness (generalized): Secondary | ICD-10-CM | POA: Diagnosis not present

## 2022-05-05 DIAGNOSIS — M5459 Other low back pain: Secondary | ICD-10-CM | POA: Diagnosis not present

## 2022-05-05 DIAGNOSIS — M2560 Stiffness of unspecified joint, not elsewhere classified: Secondary | ICD-10-CM | POA: Diagnosis not present

## 2022-05-05 DIAGNOSIS — R2689 Other abnormalities of gait and mobility: Secondary | ICD-10-CM | POA: Diagnosis not present

## 2022-05-12 ENCOUNTER — Encounter (HOSPITAL_COMMUNITY): Payer: Self-pay

## 2022-05-12 ENCOUNTER — Ambulatory Visit (HOSPITAL_COMMUNITY): Admission: RE | Admit: 2022-05-12 | Payer: Medicare Other | Source: Ambulatory Visit

## 2022-06-12 ENCOUNTER — Ambulatory Visit (INDEPENDENT_AMBULATORY_CARE_PROVIDER_SITE_OTHER): Payer: Medicare Other

## 2022-06-12 ENCOUNTER — Encounter: Payer: Self-pay | Admitting: Gastroenterology

## 2022-06-12 DIAGNOSIS — I428 Other cardiomyopathies: Secondary | ICD-10-CM | POA: Diagnosis not present

## 2022-06-13 LAB — CUP PACEART REMOTE DEVICE CHECK
Battery Remaining Longevity: 114 mo
Battery Remaining Percentage: 100 %
Brady Statistic RV Percent Paced: 5 %
Date Time Interrogation Session: 20231002180200
HighPow Impedance: 49 Ohm
Implantable Lead Implant Date: 20160308
Implantable Lead Location: 753860
Implantable Lead Model: 295
Implantable Lead Serial Number: 363165
Implantable Pulse Generator Implant Date: 20160308
Lead Channel Impedance Value: 370 Ohm
Lead Channel Pacing Threshold Amplitude: 0.8 V
Lead Channel Pacing Threshold Pulse Width: 0.5 ms
Lead Channel Setting Pacing Amplitude: 2.4 V
Lead Channel Setting Pacing Pulse Width: 0.5 ms
Lead Channel Setting Sensing Sensitivity: 0.6 mV
Pulse Gen Serial Number: 200269

## 2022-06-20 ENCOUNTER — Encounter: Payer: Self-pay | Admitting: Nurse Practitioner

## 2022-06-20 ENCOUNTER — Telehealth: Payer: Self-pay | Admitting: Nurse Practitioner

## 2022-06-28 NOTE — Progress Notes (Signed)
Remote ICD transmission.   

## 2022-07-20 ENCOUNTER — Ambulatory Visit (INDEPENDENT_AMBULATORY_CARE_PROVIDER_SITE_OTHER): Payer: Medicare Other | Admitting: Nurse Practitioner

## 2022-07-20 ENCOUNTER — Telehealth: Payer: Self-pay

## 2022-07-20 ENCOUNTER — Encounter: Payer: Self-pay | Admitting: Nurse Practitioner

## 2022-07-20 VITALS — BP 130/60 | HR 58 | Ht 72.0 in | Wt 242.0 lb

## 2022-07-20 DIAGNOSIS — Z86010 Personal history of colonic polyps: Secondary | ICD-10-CM | POA: Diagnosis not present

## 2022-07-20 DIAGNOSIS — I480 Paroxysmal atrial fibrillation: Secondary | ICD-10-CM | POA: Diagnosis not present

## 2022-07-20 MED ORDER — NA SULFATE-K SULFATE-MG SULF 17.5-3.13-1.6 GM/177ML PO SOLN: 1.0000 | Freq: Once | ORAL | 0 refills | Status: AC

## 2022-07-20 NOTE — Progress Notes (Signed)
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  He is known by Dr. Havery Moros.  He presents today to schedule a follow-up colonoscopy.  He underwent a colonoscopy 05/10/2021 which identified 14 sessile serrated and tubular adenomatous polyps removed throughout the colon and diverticulosis to the sigmoid colon.  He was advised to repeat a colonoscopy in 1 year.  He denies having any upper or lower abdominal pain.  He is passing a normal formed brown bowel movement once daily.  No rectal bleeding or black stools.  He infrequently passes a loose stool if he eats rich foods, seafood or [place] food.  He denies having any GERD symptoms.  No chest pain, palpitations, dizziness or shortness of breath.  He has a history of paroxysmal atrial fibrillation on Eliquis and beta-blocker and nonischemic cardiomyopathy with past ICD/pacemaker placement.  LVEF 40 to 45%.  He last saw his primary cardiologist Dr. Kirk Ruths 10/26/2021 and his cardiac status was stable at that time with plans to follow-up in 1 year.  Past Medical History:  Diagnosis Date   A-fib [facility])    CHF (congestive heart failure) (HCC)    Colon polyps    Essential hypertension    Hyperlipidemia    Non-ischemic cardiomyopathy (HCC)    Presence of combination internal cardiac defibrillator (ICD) and pacemaker    Prostate cancer ([place])    TIA (transient ischemic attack)    Past Surgical History:  Procedure Laterality Date   BACK SURGERY     COLONOSCOPY     about 10 years. Done in [place] or [place]    ICD  IMPLANT  2012   PROSTATECTOMY        Latest Ref Rng & Units 10/26/2021   11:22 AM 05/27/2021    8:23 AM 03/24/2021    8:15 AM  CBC  WBC 4.0 - 10.5 K/uL 7.8  6.9  7.2   Hemoglobin 13.0 - 17.0 g/dL 13.4  13.1  13.2   Hematocrit 39.0 - 52.0 % 39.6  38.8  38.8   Platelets 150.0 - 400.0 K/uL 245.0  215.0  241.0        Latest Ref Rng & Units 10/26/2021   11:22 AM 01/27/2021    3:54 PM 09/08/2020    9:21 AM  CMP  Glucose 70 - 99 mg/dL 120  147  147   BUN 6 - 23 mg/dL '16  15  13   '$ Creatinine 0.40 - 1.50 mg/dL 1.04  1.00  1.01   Sodium 135 - 145 mEq/L 136  138  140   Potassium 3.5 - 5.1 mEq/L 4.6  4.3  4.5   Chloride 96 - 112 mEq/L 102  102  102   CO2 19 - 32 mEq/L '26  25  25   '$ Calcium 8.4 - 10.5 mg/dL 9.9  9.5  9.2   Total Protein 6.0 - 8.3 g/dL 7.8  6.9  7.0   Total Bilirubin 0.2 - 1.2 mg/dL 0.5  0.5  0.5   Alkaline Phos 39 - 117 U/L 81  79  77   AST 0 - 37 U/L 15  11  14  ALT 0 - 53 U/L '19  16  19     '$ ECHO 12/03/2020: 1. Left ventricular ejection fraction, by estimation, is 40 to 45%. The left ventricle has mildly decreased function. The left ventricle demonstrates global hypokinesis. There is moderate concentric left ventricular hypertrophy. Left ventricular diastolic parameters are indeterminate. 2. Right ventricular systolic function is normal. The right ventricular size is normal. There is normal pulmonary artery systolic pressure. 3. The mitral valve is normal in structure. Trivial mitral valve regurgitation. No evidence of mitral stenosis. 4. The aortic valve is normal in structure. Aortic valve regurgitation is not visualized. No aortic stenosis is present. 5. The inferior vena cava is normal in size with greater than 50% respiratory variability, suggesting right atrial pressure of 3 mmHg  Colonoscopy 05/10/2021: - The examined portion of the ileum was normal. - Two 3 to 6 mm polyps in the cecum, removed with a cold snare. Resected and retrieved. - One 3 mm polyp in the  ascending colon, removed with a cold snare. Resected and retrieved. - Four 3 to 7 mm polyps at the hepatic flexure, removed with a cold snare. Resected and retrieved. - Four 3 to 5 mm polyps in the transverse colon, removed with a cold snare. Resected and retrieved. - Two 3 to 4 mm polyps in the descending colon, removed with a cold snare. Resected and retrieved. - One 4 mm polyp in the sigmoid colon, removed with a cold snare. Resected and retrieved. - Diverticulosis in the sigmoid colon. - Anal papilla(e) were hypertrophied. - Internal hemorrhoids. - The examination was otherwise normal. - MULTIPLE FRAGMENTS OF TUBULAR ADENOMA. - MULTIPLE FRAGMENTS OF SESSILE SERRATED POLYP WITHOUT DYSPLASIA. - NO HIGH-GRADE DYSPLASIA OR MALIGNANCY.  Current Outpatient Medications on File Prior to Visit  Medication Sig Dispense Refill   amLODipine (NORVASC) 10 MG tablet TAKE 1 TABLET(10 MG) BY MOUTH DAILY 90 tablet 3   apixaban (ELIQUIS) 5 MG TABS tablet TAKE 1 TABLET(5 MG) BY MOUTH TWICE DAILY 180 tablet 3   carvedilol (COREG) 25 MG tablet TAKE 1 TABLET(25 MG) BY MOUTH TWICE DAILY 180 tablet 3   cyclobenzaprine (FLEXERIL) 10 MG tablet Take 1 tablet (10 mg total) by mouth 2 (two) times daily as needed for muscle spasms. 60 tablet 1   fluticasone (FLONASE) 50 MCG/ACT nasal spray Place 2 sprays into both nostrils daily. (Patient taking differently: Place 2 sprays into both nostrils as needed.) 16 g 6   losartan (COZAAR) 100 MG tablet TAKE 1 TABLET(100 MG) BY MOUTH DAILY 90 tablet 3   metFORMIN (GLUCOPHAGE) 500 MG tablet TAKE 1 TABLET(500 MG) BY MOUTH TWICE DAILY WITH A MEAL 180 tablet 3   Multiple Vitamin (MULTI VITAMIN DAILY PO) Take 1 tablet by mouth daily.     rosuvastatin (CRESTOR) 20 MG tablet TAKE 1 TABLET(20 MG) BY MOUTH DAILY 90 tablet 3   spironolactone (ALDACTONE) 25 MG tablet Take 1 tablet (25 mg total) by mouth daily. 90 tablet 3   No current facility-administered medications on file prior to  visit.   Allergies  Allergen Reactions   Lisinopril     Extreme diarrhea   Current Medications, Allergies, Past Medical History, Past Surgical History, Family History and Social History were reviewed in [employer] record.  Review of Systems:   Constitutional: Negative for fever, sweats, chills or weight loss.  Respiratory: Negative for shortness of breath.   Cardiovascular: Negative for chest pain, palpitations and leg swelling.  Gastrointestinal: See HPI.  Musculoskeletal: Negative for  back pain or muscle aches.  Neurological: Negative for dizziness, headaches or paresthesias.   Physical Exam: BP 130/60 (BP Location: Left Arm, Patient Position: Sitting)   Pulse (!) 58   Ht 6' (1.829 m)   Wt 242 lb (109.8 kg)   SpO2 98%   BMI 32.82 kg/m   General: 76 year old male in no acute distress. Head: Normocephalic and atraumatic. Eyes: No scleral icterus. Conjunctiva pink . Ears: Normal auditory acuity. Mouth: Dentition intact. No ulcers or lesions.  Lungs: Clear throughout to auscultation. Heart: Regular rate and rhythm, no murmur. Abdomen: Soft, nontender and nondistended. No masses or hepatomegaly. Normal bowel sounds x 4 quadrants.  Rectal: Deferred. Musculoskeletal: Symmetrical with no gross deformities. Extremities: No edema. Neurological: Alert oriented x 4. No focal deficits.  Psychological: Alert and cooperative. Normal mood and affect  Assessment and Recommendations:  68) 76 year old male with a history of 14 tubular adenomatous/sessile serrated polyps per colonoscopy 04/2021, recall colon polyp surveillance colonoscopy recommended in 1 year. -Colonoscopy at [facility] benefits and risks discussed including risk with sedation, risk of bleeding, perforation and infection  -Our office will contact cardiologist Dr. Stanford Breed to verify Eliquis hold instructions prior to proceeding with a colonoscopy -Patient to contact our office if any changes develop in his  cardiac status prior to his procedure date -Further recommendations to be determined after colonoscopy completed  2) History of paroxysmal atrial fibrillation on Eliquis and beta-blocker -See plan in #1 regarding Eliquis instructions prior to colonoscopy  3) History of nonischemic cardiomyopathy s/p ICD/pacemaker placement 2012 with LV EF 40 - 45% per ECHO 11/2020   4) DM II

## 2022-07-20 NOTE — Telephone Encounter (Signed)
[facility] Pre-operative Risk Assessment     Request for surgical clearance:     Endoscopy Procedure  What type of surgery is being performed?     Colonoscopy  When is this surgery scheduled?     09/20/22  What type of clearance is required ?   Pharmacy  Are there any medications that need to be held prior to surgery and how long? Eliquis & 2 days  Practice name and name of physician performing surgery?      [facility]  What is your office phone and fax number?      Phone- [phone]  [phone]  Anesthesia type (None, local, MAC, general) ?       MAC

## 2022-07-20 NOTE — Patient Instructions (Addendum)
You have been scheduled for a colonoscopy. Please follow written instructions given to you at your visit today.  Please pick up your prep supplies at the pharmacy within the next 1-3 days. If you use inhalers (even only as needed), please bring them with you on the day of your procedure.   You will be contacted by our office prior to your procedure for directions on holding your Eliquis.If you do not hear from our office 1 week prior to your scheduled procedure, please call [phone] to discuss.    Please contact our office if you have any change in your cardiac status prior to your colonoscopy date.  Due to recent changes in healthcare laws, you may see the results of your imaging and laboratory studies on MyChart before your provider has had a chance to review them.  We understand that in some cases there may be results that are confusing or concerning to you. Not all laboratory results come back in the same time frame and the provider may be waiting for multiple results in order to interpret others.  Please give [place] 48 hours in order for your provider to thoroughly review all the results before contacting the office for clarification of your results.    It was a pleasure to see you today!  Thank you for trusting me with your gastrointestinal care!

## 2022-07-21 NOTE — Progress Notes (Signed)
Agree with assessment and plan as outlined.  

## 2022-07-21 NOTE — Telephone Encounter (Signed)
Patient with diagnosis of atrial fibriliation on Eliquis for anticoagulation.    Procedure: Colonoscopy  Date of procedure: 09/20/2022   CHA2DS2-VASc Score = 7   This indicates a 11.2% annual risk of stroke. The patient's score is based upon: CHF History: 1 HTN History: 1 Diabetes History: 1 Stroke History: 2 Vascular Disease History: 0 Age Score: 2 Gender Score: 0   CrCl 79 ml/min (SrCr 1.04 10/26/2021)  Platelet count 245 (10/26/2021)  Given the hx of TIA and lower bleeding risk with procedure it is ok to hold Eliquis for 1 day however, if absolutely needed may hold for 2 days before procedure. (In the past held for 1 day for colonoscopy -09/09/2019) Patient will not need bridging with Lovenox (enoxaparin) around procedure.  **This guidance is not considered finalized until pre-operative APP has relayed final recommendations.**

## 2022-07-24 ENCOUNTER — Other Ambulatory Visit: Payer: Self-pay

## 2022-07-24 ENCOUNTER — Telehealth: Payer: Self-pay

## 2022-07-24 DIAGNOSIS — I480 Paroxysmal atrial fibrillation: Secondary | ICD-10-CM

## 2022-07-24 DIAGNOSIS — R195 Other fecal abnormalities: Secondary | ICD-10-CM

## 2022-07-24 DIAGNOSIS — Z860100 Personal history of colon polyps, unspecified: Secondary | ICD-10-CM

## 2022-07-24 NOTE — Telephone Encounter (Signed)
Pt made aware of Dr. Havery Moros recommendations: Orders for labs placed in Epic: Pt made aware: Location along with hours of lab provided to pt: Pt verbalized understanding with all questions answered.

## 2022-07-24 NOTE — Progress Notes (Signed)
Remo Lipps, pls contact patient and let him know Dr. Havery Moros would like him to have more current labs done to assess for any further anemia. Pls send him to our lab to have a CBC with IBC+ ferritin panel. If these labs show evidence of iron deficiency anemia, Dr. Havery Moros advised doing an EGD at the time of his colonoscopy. See Dr. Doyne Keel addendum. THX

## 2022-07-24 NOTE — Telephone Encounter (Signed)
Spoke with patient and patient is aware of holding his Eliquis 2 days prior to his procedure.

## 2022-07-24 NOTE — Telephone Encounter (Signed)
-----   Message from Noralyn Pick, NP sent at 07/24/2022  7:44 AM EST -----    ----- Message ----- From: Yetta Flock, MD Sent: 07/21/2022   6:34 PM EST To: Noralyn Pick, NP  Thanks Jaclyn Shaggy. I would repeat a CBC with iron panel. If anemia / IDA then I would add on the EGD. Otherwise if normal then we can just do the colon. Thanks  ----- Message ----- From: Noralyn Pick, NP Sent: 07/20/2022  12:45 PM EST To: Yetta Flock, MD  Dr. Havery Moros, refer to your last office visit 08/2021.  The patient's hemoglobin level February 2023 remained stable at 13.4 has not been repeated since then.  No GERD symptoms.  I did not discuss scheduling an EGD with the patient but your last office note mentioned possible EGD if his hemoglobin level dropped.  Please let me know if you want me to send him for repeat CBC prior to his colonoscopy date.  Thank you

## 2022-07-25 ENCOUNTER — Telehealth: Payer: Self-pay | Admitting: *Deleted

## 2022-07-25 NOTE — Telephone Encounter (Signed)
   Name: Bobby Bradley  DOB: [birth year]  MRN: [id number]  Primary Cardiologist: Kirk Ruths, MD  Chart reviewed as part of pre-operative protocol coverage. Because of Mitsuru Dault past medical history and time since last visit, he will require a follow-up telephone visit in order to better assess preoperative cardiovascular risk.  Pre-op covering staff: - Please schedule appointment and call patient to inform them. If patient already had an upcoming appointment within acceptable timeframe, please add "pre-op clearance" to the appointment notes so provider is aware. - Please contact requesting surgeon's office via preferred method (i.e, phone, fax) to inform them of need for appointment prior to surgery.  Eliquis addressed below.  Elgie Collard, PA-C  07/25/2022, 10:55 AM

## 2022-07-25 NOTE — Telephone Encounter (Signed)
S/w the pt and he is agreeable to tele pre op appt 08/28/22 @ 10 am. Med rec and consent are done. See notes GI office already advised pt of the hold for Eliquis. This information should be relayed by the cardiologist office managing the pt's blood thinner.

## 2022-07-25 NOTE — Telephone Encounter (Signed)
S/w the pt and he is agreeable to tele pre op appt 08/28/22 @ 10 am. Med rec and consent are done. See notes GI office already advised pt of the hold for Eliquis. This information should be relayed by the cardiologist office managing the pt's blood thinner.     Patient Consent for Virtual Visit        Bobby Bradley has provided verbal consent on 07/25/2022 for a virtual visit (video or telephone).   CONSENT FOR VIRTUAL VISIT FOR:  Bobby Bradley  By participating in this virtual visit I agree to the following:  I hereby voluntarily request, consent and authorize [facility] and its employed or contracted physicians, physician assistants, nurse practitioners or other licensed health care professionals (the Practitioner), to provide me with telemedicine health care services (the "Services") as deemed necessary by the treating Practitioner. I acknowledge and consent to receive the Services by the Practitioner via telemedicine. I understand that the telemedicine visit will involve communicating with the Practitioner through live audiovisual communication technology and the disclosure of certain medical information by electronic transmission. I acknowledge that I have been given the opportunity to request an in-person assessment or other available alternative prior to the telemedicine visit and am voluntarily participating in the telemedicine visit.  I understand that I have the right to withhold or withdraw my consent to the use of telemedicine in the course of my care at any time, without affecting my right to future care or treatment, and that the Practitioner or I may terminate the telemedicine visit at any time. I understand that I have the right to inspect all information obtained and/or recorded in the course of the telemedicine visit and may receive copies of available information for a reasonable fee.  I understand that some of the potential risks of receiving the Services via  telemedicine include:  Delay or interruption in medical evaluation due to technological equipment failure or disruption; Information transmitted may not be sufficient (e.g. poor resolution of images) to allow for appropriate medical decision making by the Practitioner; and/or  In rare instances, security protocols could fail, causing a breach of personal health information.  Furthermore, I acknowledge that it is my responsibility to provide information about my medical history, conditions and care that is complete and accurate to the best of my ability. I acknowledge that Practitioner's advice, recommendations, and/or decision may be based on factors not within their control, such as incomplete or inaccurate data provided by me or distortions of diagnostic images or specimens that may result from electronic transmissions. I understand that the practice of medicine is not an exact science and that Practitioner makes no warranties or guarantees regarding treatment outcomes. I acknowledge that a copy of this consent can be made available to me via my patient portal ([name]), or I can request a printed copy by calling the office of [facility].    I understand that my insurance will be billed for this visit.   I have read or had this consent read to me. I understand the contents of this consent, which adequately explains the benefits and risks of the Services being provided via telemedicine.  I have been provided ample opportunity to ask questions regarding this consent and the Services and have had my questions answered to my satisfaction. I give my informed consent for the services to be provided through the use of telemedicine in my medical care

## 2022-07-26 ENCOUNTER — Other Ambulatory Visit: Payer: Self-pay

## 2022-07-26 ENCOUNTER — Other Ambulatory Visit (INDEPENDENT_AMBULATORY_CARE_PROVIDER_SITE_OTHER): Payer: Medicare Other

## 2022-07-26 DIAGNOSIS — Z86010 Personal history of colonic polyps: Secondary | ICD-10-CM

## 2022-07-26 DIAGNOSIS — D509 Iron deficiency anemia, unspecified: Secondary | ICD-10-CM

## 2022-07-26 DIAGNOSIS — I480 Paroxysmal atrial fibrillation: Secondary | ICD-10-CM

## 2022-07-26 DIAGNOSIS — R195 Other fecal abnormalities: Secondary | ICD-10-CM | POA: Diagnosis not present

## 2022-07-26 LAB — CBC WITH DIFFERENTIAL/PLATELET
Basophils Absolute: 0.1 10*3/uL (ref 0.0–0.1)
Basophils Relative: 0.8 % (ref 0.0–3.0)
Eosinophils Absolute: 0.3 10*3/uL (ref 0.0–0.7)
Eosinophils Relative: 4.5 % (ref 0.0–5.0)
HCT: 37.4 % — ABNORMAL LOW (ref 39.0–52.0)
Hemoglobin: 12.8 g/dL — ABNORMAL LOW (ref 13.0–17.0)
Lymphocytes Relative: 20.6 % (ref 12.0–46.0)
Lymphs Abs: 1.4 10*3/uL (ref 0.7–4.0)
MCHC: 34.1 g/dL (ref 30.0–36.0)
MCV: 86.3 fl (ref 78.0–100.0)
Monocytes Absolute: 0.6 10*3/uL (ref 0.1–1.0)
Monocytes Relative: 9.2 % (ref 3.0–12.0)
Neutro Abs: 4.5 10*3/uL (ref 1.4–7.7)
Neutrophils Relative %: 64.9 % (ref 43.0–77.0)
Platelets: 227 10*3/uL (ref 150.0–400.0)
RBC: 4.34 Mil/uL (ref 4.22–5.81)
RDW: 13.6 % (ref 11.5–15.5)
WBC: 6.9 10*3/uL (ref 4.0–10.5)

## 2022-07-26 LAB — IBC + FERRITIN
Ferritin: 11.4 ng/mL — ABNORMAL LOW (ref 22.0–322.0)
Iron: 45 ug/dL (ref 42–165)
Saturation Ratios: 11 % — ABNORMAL LOW (ref 20.0–50.0)
TIBC: 410.2 ug/dL (ref 250.0–450.0)
Transferrin: 293 mg/dL (ref 212.0–360.0)

## 2022-07-26 MED ORDER — FERROUS SULFATE 325 (65 FE) MG PO TABS: 325.0000 mg | ORAL_TABLET | Freq: Every day | ORAL | 3 refills | Status: DC

## 2022-08-01 ENCOUNTER — Ambulatory Visit (INDEPENDENT_AMBULATORY_CARE_PROVIDER_SITE_OTHER): Payer: Medicare Other | Admitting: Family Medicine

## 2022-08-01 DIAGNOSIS — Z0000 Encounter for general adult medical examination without abnormal findings: Secondary | ICD-10-CM

## 2022-08-01 NOTE — Progress Notes (Addendum)
PATIENT CHECK-IN and HEALTH RISK ASSESSMENT QUESTIONNAIRE:  -completed by phone/video for upcoming Medicare Preventive Visit  Pre-Visit Check-in: 1)Vitals (height, wt, BP, etc) - record in vitals section for visit on day of visit 2)Review and Update Medications, Allergies PMH, Surgeries, Social history in Epic 3)Hospitalizations in the last year with date/reason?  No   4)Review and Update Care Team (patient's specialists) in Epic 5) Complete PHQ9 in Epic  6) Complete Fall Screening in Epic 7)Review all Health Maintenance Due and order under PCP if not done.  Medicare Wellness Patient Questionnaire:  Answer theses question about your habits: Do you drink alcohol? Rarely  How many drinks do you have a day? 2-3 beers socially or 1 mixed drink - once a week, never has more than 4 drinks in a day Have you ever smoked? yes Have you stopped smoking and date if applicable? Yes, 1995  How many packs a day do you smoke? none Do you use smokeless tobacco?no  Do you use an illicit drugs?no  Do you exercises? Yes IF so, what type and how many days/minutes per week?5/wk, a variety of exercises, core daily, walking - 30 minutes, 5 days week , considering joining gym now that he has retired recently. Had been doing some balance exercises he learned.  Are you sexually active? No Number of partners? Denies any concerns What did you eat for breakfast today (or yesterday)? danish Typical breakfast varies -cereal, danish or muffin with morning coffee What did you eat for lunch today (or yesterday)? Left over BBQ chicken Typical lunch varies dramatically -mostly leftovers heated. What did you eat for diner today (or yesterday)?chinese Typical dinner varies - reports they cook a lot of veggies  Typical snacks: fruit or nuts What beverages do you drink besides water:coffee, some coke a cola (rare)  Answer theses question about you: Can you perform most household chores? Yes  Do you find it hard to follow a  conversation in a noisy room? Somewhat  Do you find it hard to understand a speaker at church or in a meeting?no  Do you often ask people to speak up or repeat themselves?no  Do you experience ringing in your ears?no Do you have difficulty understanding a soft or whispered voice? No  Do you feel that you have a problem with memory? Yes, forgets small things here and there, has good social connections, sleeps great - retired earlier this year, stress has gone down a lot since retired. Do you often misplace items? no Do you balance your checkbook and or bank acounts? yes Do you feel safe at home? yes Last dentist visit? Few months ago, had crown replaced Do you need assistance with any of the following:  Driving? No   Feeding yourself? no   Getting from bed to chair? No   Getting to the toilet? No   Bathing or showering? No   Dressing yourself? no  Managing money? no   Climbing a flight of stairs? no  Preparing meals? no  Do you have Advanced Directives in place (Living Will, Healthcare Power or Attorney)? No - but they do plan to set this up soon. Has online service to do this. This is on the to do list.    Last eye Exam and location? 2 years ago in [place]   Do you currently use prescribed or non-prescribed narcotic or opioid pain medications? No   Do you have a history or close family history of breast, ovarian, tubal or peritoneal cancer or a family  member with BRCA (breast cancer susceptibility 1 and 2) gene mutations? no  Nurse/Assistant Credentials/time stamp: TAW   ----------------------------------------------------------------------------------------------------------------------------------------------------------------------------------------------------------------------    MEDICARE ANNUAL PREVENTIVE CARE VISIT WITH PROVIDER (Welcome to Medicare, initial annual wellness or annual wellness exam)  Virtual Visit via Video Note  I connected with Marti  on 11/21/23by a  video enabled telemedicine application and verified that I am speaking with the correct person using two identifiers.  Location patient: home Location provider:work or home office Persons participating in the virtual visit: patient, provider  Concerns and/or follow up today: none   See HM section in Epic for other details of completed HM.    ROS: negative for report of fevers, unintentional weight loss, vision changes, vision loss, hearing loss or change, chest pain, sob, hemoptysis, melena, hematochezia, hematuria, genital discharge or lesions, falls, bleeding or bruising, loc, thoughts of suicide or self harm, memory loss  Patient-completed extensive health risk assessment - reviewed and discussed with the patient: See Health Risk Assessment completed with patient prior to the visit either above or in recent phone note. This was reviewed in detailed with the patient today and appropriate recommendations, orders and referrals were placed as needed per Summary below and patient instructions.   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 Done in [place] or [place]    ICD IMPLANT  2012   PROSTATECTOMY      Social History   Socioeconomic History    Marital status: Married    Spouse name: Not on file   Number of children: 7   Years of education: Not on file   Highest education level: Not on file  Occupational History   Occupation: Art therapist   Tobacco Use   Smoking status: Former    Types: Cigarettes    Quit date: 09/12/1983    Years since quitting: 38.9    Passive exposure: Never   Smokeless tobacco: Never  Vaping Use   Vaping Use: Never used  Substance and Sexual Activity   Alcohol use: Yes    Comment: very rare   Drug use: No   Sexual activity: Not Currently  Other Topics Concern   Not on file  Social History Narrative   Not on file   Social Determinants of Health   Financial Resource Strain: Low Risk  (07/13/2021)   Overall Financial Resource Strain (CARDIA)    Difficulty of Paying Living Expenses: Not hard at all  Food Insecurity: No [place] (07/13/2021)   Hunger Vital Sign    Worried About Running Out of Food in the Last Year: Never true    Ran Out of Food in the Last Year: Never true  Transportation Needs: No  Transportation Needs (07/13/2021)   PRAPARE - Hydrologist (Medical): No    Lack of Transportation (Non-Medical): No  Physical Activity: Inactive (07/13/2021)   Exercise Vital Sign    Days of Exercise per Week: 0 days    Minutes of Exercise per Session: 0 min  Stress: No Stress Concern Present (07/13/2021)   [place]    Feeling of Stress : Not at all  Social Connections: Socially Integrated (07/13/2021)   Social Connection and Isolation Panel [NHANES]    Frequency of Communication with Friends and Family: More than three times a week    Frequency of Social Gatherings with Friends and Family: More than three times a week    Attends Religious Services: More than 4 times per year    Active Member of [employer] or Organizations: Yes    Attends Music therapist: More than 4 times per  year    Marital Status: Married  Human resources officer Violence: Not At Risk (07/13/2021)   Humiliation, Afraid, Rape, and Kick questionnaire    Fear of Current or Ex-Partner: No    Emotionally Abused: No    Physically Abused: No    Sexually Abused: No    Family History  Problem Relation Age of Onset   Lung cancer Mother    Uterine cancer Mother    Lung cancer Father    Atrial fibrillation Sister    Lung cancer Brother    Colon cancer Neg Hx    Esophageal cancer Neg Hx    Pancreatic cancer Neg Hx    Liver disease Neg Hx    Stomach cancer Neg Hx    Rectal cancer Neg Hx     Current Outpatient Medications on File Prior to Visit  Medication Sig Dispense Refill   amLODipine (NORVASC) 10 MG tablet TAKE 1 TABLET(10 MG) BY MOUTH DAILY 90 tablet 3   apixaban (ELIQUIS) 5 MG TABS tablet TAKE 1 TABLET(5 MG) BY MOUTH TWICE DAILY 180 tablet 3   carvedilol (COREG) 25 MG tablet TAKE 1 TABLET(25 MG) BY MOUTH TWICE DAILY 180 tablet 3   cyclobenzaprine (FLEXERIL) 10 MG tablet Take 1 tablet (10 mg total) by mouth 2 (two) times daily as needed for muscle spasms. 60 tablet 1   ferrous sulfate 325 (65 FE) MG tablet Take 1 tablet (325 mg total) by mouth daily with breakfast. 30 tablet 3   fluticasone (FLONASE) 50 MCG/ACT nasal spray Place 2 sprays into both nostrils daily. (Patient taking differently: Place 2 sprays into both nostrils as needed.) 16 g 6   losartan (COZAAR) 100 MG tablet TAKE 1 TABLET(100 MG) BY MOUTH DAILY 90 tablet 3   metFORMIN (GLUCOPHAGE) 500 MG tablet TAKE 1 TABLET(500 MG) BY MOUTH TWICE DAILY WITH A MEAL 180 tablet 3   Multiple Vitamin (MULTI VITAMIN DAILY PO) Take 1 tablet by mouth daily.     rosuvastatin (CRESTOR) 20 MG tablet TAKE 1 TABLET(20 MG) BY MOUTH DAILY 90 tablet 3   spironolactone (ALDACTONE) 25 MG tablet Take 1 tablet (25 mg total) by mouth daily. 90 tablet 3   No current facility-administered medications on file prior to visit.    Allergies  Allergen Reactions    Lisinopril     Extreme diarrhea       Physical Exam There were no vitals filed for this visit. Estimated body mass index is 32.82 kg/m as calculated from the following:   Height as of 07/20/22: 6' (  1.829 m).   Weight as of 07/20/22: 242 lb (109.8 kg).  EKG (optional): deferred due to virtual visit  GENERAL: alert, oriented, appears well and no audible distress  HEENT: eye exam deferred due to virtual   PSYCH/NEURO: pleasant and cooperative, no obvious depression or anxiety, speech and thought processing grossly intact, Cognitive function grossly intact        08/01/2022   11:38 AM 07/13/2021    3:46 PM 01/27/2021    3:26 PM 01/07/2018    1:30 PM  Depression screen PHQ 2/9  Decreased Interest 0 0 0 0  Down, Depressed, Hopeless 0 0 0 0  PHQ - 2 Score 0 0 0 0      01/07/2018    1:30 PM 01/27/2021    3:26 PM 07/13/2021    3:45 PM 08/01/2022   11:37 AM  Fall Risk  Falls in the past year? No 0 0 0  Was there an injury with Fall?   0 0  Fall Risk Category Calculator   0 0  Fall Risk Category   Low Low  Patient Fall Risk Level   Low fall risk Low fall risk  Patient at Risk for Falls Due to    No Fall Risks  Fall risk Follow up   Falls prevention discussed Falls evaluation completed     SUMMARY AND PLAN:  Medicare annual wellness visit, subsequent   All appropriate health maintenance/preventive measures for age/risks discussed and when due are noted below. The following health maintenance/preventive care measures were recommended/referred if needed and if the patient was agreeable:  Vaccines: he has flu and covid vaccines on his to do list, plans to do at pharmacy. Reports had both shinrix vaccines at pharmacy, asked to obtain record and provide to pcp office if  able.  Colorectal cancer screening: reports colonoscopy scheduled in january  Screening for glaucoma if applicable: due for eye exam and he agrees to schedule  He plans to schedule appt with PCP for follow up  and the following labs/tests: Cardiovascular screening blood tests if applicable Diabetes screening tests if applicable Diabetic renal screening Diabetic foot exam  Education and counseling on the following was provided based on the above review of health and a plan/checklist for the patient, along with additional information discussed, was provided for the patient in the patient instructions :  -Advised on importance of and resources for completing advanced directives - he has online help for this through his retirement program, provided info in pt instructions -Advised and counseled on maintaining healthy weight and healthy lifestyle - including the importance of a health diet, regular physical activity, social connections and stress management. -Advised and counseled on a whole foods based healthy diet and regular exercise: discussed a heart healthy whole foods based diet at length. A summary of a healthy diet was provided in the Patient Instructions.Recommended regular exercise and discussed options within the community.  -Advised yearly dental visits at minimum and regular eye exams  Follow up: see patient instructions   Patient Instructions  CHECKLIST FOR A HEALTHY LIFE:  -Eat a healthy whole foods based diet, get regular physical activity, manage stress and engage in social connections - see below for specific suggestions and more information.  -Vaccines due:  -due for flu and covid vaccines  -please bring record of your shingles vaccines to your primary care doctor  -Tests and screenings due:  -colonoscopy - as scheduled   -Schedule in office visit with your Primary Care Doctor (call today  to schedule) for:   - diabetic foot exam   -labs for diabetes: diabetic kidney exam and hgbaA1c   -Schedule your diabetic and glaucoma screenings with your eye doctor (call today to schedule)   -See a dentist at least yearly  -Get your eyes checked per your eye specialist's  recommendations  -Other issues addressed today: -healthy whole food plant heavy diet (see below for details) -150 minutes of exercise per week, do strength/resistance training 2 days per week and some balance exercises 2-3 days per week  -Follow up: -yearly for annual wellness visit with primary care office  -----------------------------------------------------------------------------------------------------------------------------------------------------------------------------------------------------------------------------------------------------------  [place]: -eat real food: lots of colorful vegetables (half the plate) -consume on a regular basis: whole grains (make sure first ingredient on label contains the word "whole"), fresh fruits, fish, nuts, seeds, healthy oils (such as olive oil, nuts and seeds) -may eat small amounts of dairy and lean meat on occasion, but avoid processed meats such as ham, bacon, lunch meat, etc. -drink water -try to avoid fast food and pre-packaged foods, processed meat -try to avoid foods that contain any ingredients with names you do not recognize  -try to avoid sugar/sweets (except for the natural sugar that occurs in fresh fruit) -try to avoid sweet drinks -try to avoid white rice, white bread, pasta (unless whole grain), white or yellow potatoes  MOVE - the key to keeping your body moving and working best: -gradually increase intentional physical activity -move and stretch your body, legs, feet and arms when sitting for long periods -try to get at least 150 minutes per week, 20-30 minutes of sustained activity or two 10 minute episodes of sustained activity every day. If you need assistance with exercise you could consider the following community options:  -Silver sneakers https://tools.silversneakers.com  -Walk with a Doc: http://stephens-thompson.biz/  -try to include resistance (weight lifting/strength building)  and balance exercises twice per week: or the following link for ideas: ChessContest.fr  UpdateClothing.com.cy  STRESS MANAGEMENT - so important for health and well being -try meditating, or just sitting quietly with deep breathing while intentionally relaxing all parts of your body for 5 minutes daily  SOCIAL CONNECTIONS: -options in [place] if you wish to engage in more social and exercise related activities:  -Silver sneakers https://tools.silversneakers.com  -Walk with a Doc: http://stephens-thompson.biz/  -Check out the [place] 50+ section on the [place] of [employer] (hiking clubs, book clubs, cards and games, chess, exercise classes, aquatic classes and much more) - see the website for details: https://www.[place].gov/departments/parks-recreation/active-adults50  -YouTube has lots of exercise videos for different ages and abilities as well  -[place] (a variety of indoor and outdoor inperson activities for adults). (581)569-6903. [address]  -Virtual Online Classes (a variety of topics): see seniorplanet.org or call [phone]  -consider volunteering at a school, hospice center, church, senior center or elsewhere         Lucretia Kern, DO

## 2022-08-01 NOTE — Patient Instructions (Addendum)
CHECKLIST FOR A HEALTHY LIFE:  -Eat a healthy whole foods based diet, get regular physical activity, manage stress and engage in social connections - see below for specific suggestions and more information.  -Vaccines due:  -due for flu and covid vaccines  -please bring record of your shingles vaccines to your primary care doctor  -Tests and screenings due:  -colonoscopy - as scheduled   -Schedule in office visit with your Primary Care Doctor (call today to schedule) for:   - diabetic foot exam   -labs for diabetes: diabetic kidney exam and hgbaA1c   -Schedule your diabetic and glaucoma screenings with your eye doctor (call today to schedule)   -See a dentist at least yearly  -Get your eyes checked per your eye specialist's recommendations  -Other issues addressed today: -healthy whole food plant heavy diet (see below for details) -150 minutes of exercise per week, do strength/resistance training 2 days per week and some balance exercises 2-3 days per week  -Follow up: -yearly for annual wellness visit with primary care office    FOOD - THE FUEL FOR A HAPPY HEALTHY LIFE: -eat real food: lots of colorful vegetables (half the plate) -consume on a regular basis: whole grains (make sure first ingredient on label contains the word "whole"), fresh fruits, fish, nuts, seeds, healthy oils (such as olive oil, nuts and seeds) -may eat small amounts of dairy and lean meat on occasion, but avoid processed meats such as ham, bacon, lunch meat, etc. -drink water -try to avoid fast food and pre-packaged foods, processed meat -try to avoid foods that contain any ingredients with names you do not recognize  -try to avoid sugar/sweets (except for the  natural sugar that occurs in fresh fruit) -try to avoid sweet drinks -try to avoid white rice, white bread, pasta (unless whole grain), white or yellow potatoes  MOVE - the key to keeping your body moving and working best: -gradually increase intentional physical activity -move and stretch your body, legs, feet and arms when sitting for long periods -try to get at least 150 minutes per week, 20-30 minutes of sustained activity or two 10 minute episodes of sustained activity every day. If you need assistance with exercise you could consider the following community options:  -Silver sneakers https://tools.silversneakers.com  -Walk with a Doc: http://stephens-thompson.biz/  -try to include resistance (weight lifting/strength building) and balance exercises twice per week: or the following link for ideas: ChessContest.fr  UpdateClothing.com.cy  STRESS MANAGEMENT - so important for health and well being -try meditating, or just sitting quietly with deep breathing while intentionally relaxing all parts of your body for 5 minutes daily  SOCIAL CONNECTIONS: -options in [place] if you wish to engage in more social and exercise related activities:  -Silver sneakers https://tools.silversneakers.com  -Walk with a Doc: http://stephens-thompson.biz/  -Check out the [place] 50+ section on the [place] of [employer] (hiking clubs, book clubs, cards and games, chess, exercise classes, aquatic classes and much more) - see the website for details: https://www.[place].gov/departments/parks-recreation/active-adults50  -YouTube has lots of exercise videos for different ages and abilities as well  -[place] (a variety of indoor and outdoor inperson activities for adults). [phone]. [address]  -Virtual Online Classes (a variety of topics): see  seniorplanet.org or call 605-321-5502  -consider volunteering at a school, hospice center, church, senior center or elsewhere   ADVANCED HEALTHCARE DIRECTIVES:  Everyone should have advanced health care directives in place. This is so that you get the care you  want, should you ever be in a situation where you are unable to make your own medical decisions.   From the [place] Advanced Directive Website: "[place] are legal documents in which you give written instructions about your health care if, in the future, you cannot speak for yourself.   A health care power of attorney allows you to name a person you trust to make your health care decisions if you cannot make them yourself. A declaration of a desire for a natural death (or living will) is document, which states that you desire not to have your life prolonged by extraordinary measures if you have a terminal or incurable illness or if you are in a vegetative state. An advance instruction for mental health treatment makes a declaration of instructions, information and preferences regarding your mental health treatment. It also states that you are aware that the advance instruction authorizes a mental health treatment provider to act according to your wishes. It may also outline your consent or refusal of mental health treatment. A declaration of an anatomical gift allows anyone over the age of 22 to make a gift by will, organ donor card or other document."   Please see the following website or an elder law attorney for forms, FAQs and for completion of advanced directives: [place] Secretary of [place] ([email or web address].no)  Or copy and paste the following to your web browser: [email or web address]

## 2022-08-28 ENCOUNTER — Ambulatory Visit: Payer: Medicare Other | Attending: Cardiology | Admitting: Student

## 2022-08-28 DIAGNOSIS — Z01810 Encounter for preprocedural cardiovascular examination: Secondary | ICD-10-CM

## 2022-08-28 NOTE — Progress Notes (Signed)
Virtual Visit via Telephone Note   Because of Bobby Bradley co-morbid illnesses, he is at least at moderate risk for complications without adequate follow up.  This format is felt to be most appropriate for this patient at this time.  The patient did not have access to video technology/had technical difficulties with video requiring transitioning to audio format only (telephone).  All issues noted in this document were discussed and addressed.  No physical exam could be performed with this format.  Please refer to the patient's chart for his consent to telehealth for [facility].  Evaluation Performed:  Preoperative Cardiovascular Risk Assessment _____________   Date:  08/28/2022   Patient ID:  Bobby Bradley, DOB 14-Oct-1945, MRN [id number] Patient Location:  Home Provider location:   Office  Primary Care Provider:  Darreld Mclean, MD Primary Cardiologist:  Kirk Ruths, MD  Chief Complaint / Patient Profile   Bobby Bradley is a 76 y.o. year old male with a history of non-ischemic cardiomyopathy/ chronic systolic CHF with EF of 03-70% on Echo in 11/2020 s/p ICD, paroxysmal atrial fibrillation on Eliquis, TIA, hypertension, hyperlipidemia, and prostate cancer who is pending an endoscopy/ colonoscopy on 09/20/2022 and presents today for telephonic preoperative cardiovascular risk assessment.  Past Medical History    Past Medical History:  Diagnosis Date   A-fib [facility])    CHF (congestive heart failure) ([place])    Colon polyps    Essential hypertension    Hyperlipidemia    Non-ischemic cardiomyopathy (HCC)    Presence of combination internal cardiac defibrillator (ICD) and pacemaker    Prostate cancer ([place])    TIA (transient ischemic attack)    Past Surgical History:  Procedure Laterality Date   BACK SURGERY     COLONOSCOPY     about 10 years. Done in [place] or [place]    ICD IMPLANT  2012   PROSTATECTOMY      Allergies  Allergies   Allergen Reactions   Lisinopril     Extreme diarrhea    History of Present Illness    Bobby Bradley is a 76 y.o. male who presents via audio/video conferencing for a telehealth visit today.  Patient was last seen in our office on 10/26/2021 by Dr. Stanford Breed.  At that time, he reported dyspnea with more extreme activities but not with routine activities. He was otherwise doing well from a cardiac standpoint. He is now pending procedure as outlined above. Since his last visit, he has continued to have progression of his shortness of breath. He feels like this is more pronounced compared to last visit and that he has to stop more frequently to catch his breath. He states he was recently moving Christmas decoration up an down the stairs and had to stop multiple times due to shortness of breath. He denies any shortness of breath at rest and no chest pain. No orthopnea, PND, or edema. No palpitations, lightheadedness, dizziness, or syncope.    Home Medications    Prior to Admission medications   Medication Sig Start Date End Date Taking? Authorizing Provider  amLODipine (NORVASC) 10 MG tablet TAKE 1 TABLET(10 MG) BY MOUTH DAILY 10/26/21   Lelon Perla, MD  apixaban (ELIQUIS) 5 MG TABS tablet TAKE 1 TABLET(5 MG) BY MOUTH TWICE DAILY 10/26/21   Lelon Perla, MD  carvedilol (COREG) 25 MG tablet TAKE 1 TABLET(25 MG) BY MOUTH TWICE DAILY 10/26/21   Lelon Perla, MD  cyclobenzaprine (FLEXERIL) 10 MG tablet Take 1 tablet (  10 mg total) by mouth 2 (two) times daily as needed for muscle spasms. 02/17/22   Marrian Salvage, FNP  ferrous sulfate 325 (65 FE) MG tablet Take 1 tablet (325 mg total) by mouth daily with breakfast. 07/26/22   Armbruster, Carlota Raspberry, MD  fluticasone (FLONASE) 50 MCG/ACT nasal spray Place 2 sprays into both nostrils daily. Patient taking differently: Place 2 sprays into both nostrils as needed. 12/15/17   Midge Minium, MD  losartan (COZAAR) 100 MG tablet TAKE 1  TABLET(100 MG) BY MOUTH DAILY 10/26/21   Lelon Perla, MD  metFORMIN (GLUCOPHAGE) 500 MG tablet TAKE 1 TABLET(500 MG) BY MOUTH TWICE DAILY WITH A MEAL 09/12/21   Copland, Gay Filler, MD  Multiple Vitamin (MULTI VITAMIN DAILY PO) Take 1 tablet by mouth daily.    [provider]  rosuvastatin (CRESTOR) 20 MG tablet TAKE 1 TABLET(20 MG) BY MOUTH DAILY 10/28/21   Copland, Gay Filler, MD  spironolactone (ALDACTONE) 25 MG tablet Take 1 tablet (25 mg total) by mouth daily. 10/26/21   Lelon Perla, MD    Physical Exam    Vital Signs:  [name] does not have vital signs available for review today.  Given telephonic nature of communication, physical exam is limited. Alert and oriented x3. No acute distress. Normal affect. Speech and respirations are unlabored.  Accessory Clinical Findings    None  Assessment & Plan    Preoperative Cardiovascular Risk Assessment: Patient has an upcoming endoscopy/ colonoscopy planned. Since his last visit in 10/2021, he has noticed worsening dyspnea on exertion. He denies any chest pain with this. Endoscopies/ colonoscopies are normally low risk procedures but given worsening dyspnea and the fact that it has been over 10 months since we have seen him, I think he should have an in-person office visit before we can complete risk assessment. May need to consider a Myoview to rule out dyspnea as an anginal equivalent.  I was able to get him a follow-up visit in our office later this week on 08/31/2022 with Almyra Deforest, PA. We can address further at that time.   Of note, per Pharmacy and office protocol, patient would ideally only hold Eliquis for 1 day prior to procedure given history of TIA; however, if absolutely necessary OK to hold for 2 days before. Please restart this as soon as safely possible afterwards.    Time:   Today, I have spent 11 minutes with the patient with telehealth technology discussing medical history, symptoms, and management plan.      Darreld Mclean, PA-C  08/28/2022, 10:18 AM

## 2022-08-30 NOTE — Progress Notes (Addendum)
Cardiology Clinic Note   Patient Name: Bobby Bradley Date of Encounter: 08/31/2022  Primary Care Provider:  Darreld Mclean, MD Primary Cardiologist:  Kirk Ruths, MD  Patient Profile    Bobby Bradley is a 76 y.o. male with a past medical history of permanent afib on anticoagulation, chronic systolic congestive heart failure/NICM s/p ICD implantation, history of TIA, hypertension, hyperlipidemia, iron deficiency anemia who presents to the clinic today for worsening shortness of breath.   Past Medical History    Past Medical History:  Diagnosis Date   A-fib [facility])    CHF (congestive heart failure) ([place])    Colon polyps    Essential hypertension    Hyperlipidemia    Non-ischemic cardiomyopathy (HCC)    Presence of combination internal cardiac defibrillator (ICD) and pacemaker    Prostate cancer ([place])    TIA (transient ischemic attack)    Past Surgical History:  Procedure Laterality Date   BACK SURGERY     COLONOSCOPY     about 10 years. Done in [place] or [place]    ICD IMPLANT  2012   PROSTATECTOMY      Allergies  Allergies  Allergen Reactions   Lisinopril     Extreme diarrhea    History of Present Illness    Bobby Bradley has a past medical history of: Permanent afib.  Chronic systolic heart failure/NICM s/p ICD implantation 11/17/2014.  Echo 12/03/2020: EF 40-45%. Mildly decreased LV function. Global hypokinesis. Moderate concentric LVH. Trivial MR.  Remote device check 06/12/2022: Normal function, stable battery and lead parameters.  Hypertension. Hyperlipidemia. Lipid panel 10/26/2021: Direct LDL 66, HDL 34, TG 285, total 139.  History of TIA.  Prostate cancer.  Iron deficiency anemia.   Bobby Bradley established with Dr. Stanford Breed in February 2018 after moving to the area from [place]. His past history in [place] included significant ectopy and past LHC that revealed no coronary disease. He had a TIA and was diagnosed with  CM and PAF. He had an ICD placed. He was evaluated by Dr. Curt Bears in September 2018 to follow him for chronic systolic heart failure and ICD. Patient was last seen in the office on 10/26/2021 by Dr. Stanford Breed. At that time he complained of dyspnea with extreme activities without chest pain relieved with rest but no shortness of breath or chest pain with routine activities. No changes were made.    Most recently, patient was evaluated via virtual visit for postoperative risk assessment for colonoscopy/endoscopy with Sande Rives, PA-C on 08/28/2022. At that time he complained of worsening shortness of breath. He was moving Christmas decorations up and down the stairs and had to stop several times secondary to shortness of breath.   Today, patient reports worsening dyspnea with exertion over the last several months. He states previously ("for years") he noticed some dyspnea with heavy exertion. 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 He states dyspnea slightly worse from September. He denies any episodes of chest pain with activity. He does not have shortness of breath at rest. No orthopnea or PND. He denies lower extremity edema or bloating/fullness in his abdomen. He does not weigh every day. No palpitations, dizziness or syncope. He recently had heme positive stool and was diagnosed with mild iron deficiency anemia.  He does not have a dedicated exercise program. He has about 20 stairs in  his home and is able to go up and down one time without shortness of breath or chest pain. He is independent with all ADLs and walking through grocery store. If he is carrying something or has to navigate stairs more than once he does feel winded.    Home Medications    Current Meds  Medication Sig   amLODipine (NORVASC) 10 MG tablet TAKE 1 TABLET(10 MG) BY MOUTH DAILY    apixaban (ELIQUIS) 5 MG TABS tablet TAKE 1 TABLET(5 MG) BY MOUTH TWICE DAILY   carvedilol (COREG) 25 MG tablet TAKE 1 TABLET(25 MG) BY MOUTH TWICE DAILY   cyclobenzaprine (FLEXERIL) 10 MG tablet Take 1 tablet (10 mg total) by mouth 2 (two) times daily as needed for muscle spasms.   ferrous sulfate 325 (65 FE) MG tablet Take 1 tablet (325 mg total) by mouth daily with breakfast.   fluticasone (FLONASE) 50 MCG/ACT nasal spray Place 2 sprays into both nostrils daily. (Patient taking differently: Place 2 sprays into both nostrils as needed.)   losartan (COZAAR) 100 MG tablet TAKE 1 TABLET(100 MG) BY MOUTH DAILY   metFORMIN (GLUCOPHAGE) 500 MG tablet TAKE 1 TABLET(500 MG) BY MOUTH TWICE DAILY WITH A MEAL   Multiple Vitamin (MULTI VITAMIN DAILY PO) Take 1 tablet by mouth daily.   rosuvastatin (CRESTOR) 20 MG tablet TAKE 1 TABLET(20 MG) BY MOUTH DAILY   spironolactone (ALDACTONE) 25 MG tablet Take 1 tablet (25 mg total) by mouth daily.    Family History    Family History  Problem Relation Age of Onset   Lung cancer Mother    Uterine cancer Mother    Lung cancer Father    Atrial fibrillation Sister    Lung cancer Brother    Colon cancer Neg Hx    Esophageal cancer Neg Hx    Pancreatic cancer Neg Hx    Liver disease Neg Hx    Stomach cancer Neg Hx    Rectal cancer Neg Hx    He indicated that his mother is deceased. He indicated that his father is deceased. He indicated that his sister is alive. He indicated that his brother is deceased. He indicated that the status of his neg hx is unknown.   Social History    Social History   Socioeconomic History   Marital status: Married    Spouse name: Not on file   Number of children: 7   Years of education: Not on file   Highest education level: Not on file  Occupational History   Occupation: Art therapist   Tobacco Use   Smoking status: Former    Types: Cigarettes    Quit date: 09/12/1983    Years since quitting: 38.9    Passive  exposure: Never   Smokeless tobacco: Never  Vaping Use   Vaping Use: Never used  Substance and Sexual Activity   Alcohol use: Yes    Comment: very rare   Drug use: No   Sexual activity: Not Currently  Other Topics Concern   Not on file  Social History Narrative   Not on file   Social Determinants of Health   Financial Resource Strain: [place]  (07/13/2021)   Overall Financial Resource Strain (CARDIA)    Difficulty of Paying Living Expenses: Not hard at all  Food Insecurity: No Food Insecurity (07/13/2021)   Hunger Vital Sign    Worried About Running Out of Food in the Last Year: Never true    [place] in  the Last Year: Never true  Transportation Needs: No Transportation Needs (07/13/2021)   PRAPARE - Hydrologist (Medical): No    Lack of Transportation (Non-Medical): No  Physical Activity: Inactive (07/13/2021)   Exercise Vital Sign    Days of Exercise per Week: 0 days    Minutes of Exercise per Session: 0 min  Stress: No Stress Concern Present (07/13/2021)   [place]    Feeling of Stress : Not at all  Social Connections: [place] (07/13/2021)   Social Connection and Isolation Panel [NHANES]    Frequency of Communication with Friends and Family: More than three times a week    Frequency of Social Gatherings with Friends and Family: More than three times a week    Attends Religious Services: More than 4 times per year    Active Member of [employer] or Organizations: Yes    Attends Music therapist: More than 4 times per year    Marital Status: Married  Human resources officer Violence: Not At Risk (07/13/2021)   Humiliation, Afraid, Rape, and Kick questionnaire    Fear of Current or Ex-Partner: No    Emotionally Abused: No    Physically Abused: No    Sexually Abused: No     Review of Systems    General:  No chills, fever, night sweats or weight changes.   Cardiovascular:  No chest pain, edema, orthopnea, palpitations, paroxysmal nocturnal dyspnea. Positive for dyspnea on exertion.  Dermatological: No rash, lesions/masses Respiratory: No cough, dyspnea at rest.  Urologic: No hematuria, dysuria Abdominal:   No nausea, vomiting, diarrhea, bright red blood per rectum, melena, or hematemesis Neurologic:  No visual changes, weakness, changes in mental status. All other systems reviewed and are otherwise negative except as noted above.  Physical Exam    VS:  BP 124/76   Pulse 60   Ht 6' (1.829 m)   Wt 245 lb 12.8 oz (111.5 kg)   SpO2 96%   BMI 33.34 kg/m  , BMI Body mass index is 33.34 kg/m. GEN:  Well nourished, well developed, in no acute distress. HEENT: Normal. Neck: Supple, no JVD, carotid bruits, or masses. Cardiac: RRR, no murmurs, rubs, or gallops. No clubbing, cyanosis, edema.  Radials/DP/PT 2+ and equal bilaterally.  Respiratory:  Respirations regular and unlabored, clear to auscultation bilaterally. GI: Soft, nontender, nondistended. MS: No deformity or atrophy. Skin: Warm and dry, no rash. Neuro: Strength and sensation are intact. Psych: Normal affect.  Accessory Clinical Findings    Recent Labs: 10/26/2021: ALT 19; BUN 16; Creatinine, Ser 1.04; Potassium 4.6; Sodium 136 07/26/2022: Hemoglobin 12.8; Platelets 227.0   Recent Lipid Panel    Component Value Date/Time   CHOL 139 10/26/2021 1122   TRIG 285.0 (H) 10/26/2021 1122   HDL 34.00 (L) 10/26/2021 1122   CHOLHDL 4 10/26/2021 1122   VLDL 57.0 (H) 10/26/2021 1122   LDLCALC 33 01/27/2021 1554   LDLDIRECT 66.0 10/26/2021 1122         ECG personally reviewed by me today: Sinus rhythm with 1st degree AV block with occasional v-paced complexes and occasional PVCs. HR 60.   No significant changes from 10/26/2021.   CHA2DS2-VASc Score = 7   This indicates a 11.2% annual risk of stroke. The patient's score is based upon: CHF History: 1 HTN History: 1 Diabetes  History: 1 Stroke History: 2 Vascular Disease History: 0 Age Score: 2 Gender Score: 0  Assessment & Plan    DOE/iron deficiency anemia. Dyspnea with exertion has increased since February. Patient reports activities such as bringing garbage cans down his driveway or carrying items up and down stairs at home will cause shortness of breath. He denies chest pain. He was recently diagnosed with iron deficiency anemia. Will get CBC, BMP, TSH, MG today. Repeat Echo to determine heart function.   PAF. EKG today Sinus rhythm with 1st AV block, occasional PVCs and occasionally v-paced, HR 60. Continue carvedilol and Eliquis. Patient does not meet Eliquis dose adjustment requirements.   Chronic systolic heart failure, NICM, presence of ICD. Echo March 2022 EF 40-45%, mildly decreased LV function, global hypokinesis. Remote device check October 2023 was normal with stable battery and lead parameters. Euvolemic and well compensated on exam. Given increased DOE as described above (see #1) will repeat echo and obtain blood work as above. Continue carvedilol, losartan, and spirolactone.  Hypertension. BP today 124/76. Patient denies headaches or dizziness. Continue amlodipine, carvediolol, and losartan.  Hyperlipidemia. Direct LDL February 2023 66, at goal. Continue Crestor.  Preoperative cardiovascular risk assessment.  Chart reviewed as part of pre-operative protocol coverage. Patient does not have a history of ischemic heart disease, PCI, or stroke. According to the RCRI, patient has a 0.9% risk of MACE. Patient reports activity equivalent to 4.0 METS. Due to patient's increased DOE will obtain echo. Based on ACC/AHA guidelines, if echo is stable patient would be at acceptable risk for the planned procedure without further cardiovascular testing.   Addendum 09/02/2022: Echo showed normal ventricular function and grade I DD. Patient is at an acceptable risk for planned endoscopy/colonoscopy without further  cardiovascular testing.   Per Pharm D: Procedure: Colonoscopy  Date of procedure: 09/20/2022     CHA2DS2-VASc Score = 7   This indicates a 11.2% annual risk of stroke. The patient's score is based upon: CHF History: 1 HTN History: 1 Diabetes History: 1 Stroke History: 2 Vascular Disease History: 0 Age Score: 2 Gender Score: 0     CrCl 79 ml/min (SrCr 1.04 10/26/2021)  Platelet count 245 (10/26/2021)   Given the hx of TIA and lower bleeding risk with procedure it is ok to hold Eliquis for 1 day however, if absolutely needed may hold for 2 days before procedure. (In the past held for 1 day for colonoscopy -09/09/2019) Patient will not need bridging with Lovenox (enoxaparin) around procedure.     Disposition: CBC, BMP, TSH, MG, echo. Return to Dr. Stanford Breed per previous recall.    Justice Britain. Anny Sayler, NP-C     08/31/2022, 2:39 PM [place] [address] Office [phone] Fax [phone]   I spent 14 minutes examining this patient, reviewing medications, and using patient centered shared decision making involving her cardiac care.  Prior to her visit I spent greater than 20 minutes reviewing her past medical history,  medications, and prior cardiac tests.

## 2022-08-31 ENCOUNTER — Encounter: Payer: Self-pay | Admitting: Physician Assistant

## 2022-08-31 ENCOUNTER — Ambulatory Visit: Payer: Medicare Other | Attending: Physician Assistant | Admitting: Student

## 2022-08-31 VITALS — BP 124/76 | HR 60 | Ht 72.0 in | Wt 245.8 lb

## 2022-08-31 DIAGNOSIS — E785 Hyperlipidemia, unspecified: Secondary | ICD-10-CM | POA: Diagnosis not present

## 2022-08-31 DIAGNOSIS — D509 Iron deficiency anemia, unspecified: Secondary | ICD-10-CM | POA: Diagnosis not present

## 2022-08-31 DIAGNOSIS — R0609 Other forms of dyspnea: Secondary | ICD-10-CM

## 2022-08-31 DIAGNOSIS — I5022 Chronic systolic (congestive) heart failure: Secondary | ICD-10-CM | POA: Diagnosis not present

## 2022-08-31 DIAGNOSIS — I10 Essential (primary) hypertension: Secondary | ICD-10-CM

## 2022-08-31 DIAGNOSIS — Z95810 Presence of automatic (implantable) cardiac defibrillator: Secondary | ICD-10-CM | POA: Diagnosis not present

## 2022-08-31 DIAGNOSIS — Z01810 Encounter for preprocedural cardiovascular examination: Secondary | ICD-10-CM | POA: Diagnosis not present

## 2022-08-31 DIAGNOSIS — I480 Paroxysmal atrial fibrillation: Secondary | ICD-10-CM

## 2022-08-31 NOTE — Patient Instructions (Signed)
Medication Instructions:  No Changes *If you need a refill on your cardiac medications before your next appointment, please call your pharmacy*   Lab Work: BMET, CBC, Magnesium Level, TSH.  Today If you have labs (blood work) drawn today and your tests are completely normal, you will receive your results only by: [place] (if you have MyChart) OR A paper copy in the mail If you have any lab test that is abnormal or we need to change your treatment, we will call you to review the results.   Testing/Procedures: [address]. Your physician has requested that you have an echocardiogram. Echocardiography is a painless test that uses sound waves to create images of your heart. It provides your doctor with information about the size and shape of your heart and how well your heart's chambers and valves are working. This procedure takes approximately one hour. There are no restrictions for this procedure. Please do NOT wear cologne, perfume, aftershave, or lotions (deodorant is allowed). Please arrive 15 minutes prior to your appointment time.    Follow-Up: At [facility], you and your health needs are our priority.  As part of our continuing mission to provide you with exceptional heart care, we have created designated Provider Care Teams.  These Care Teams include your primary Cardiologist (physician) and Advanced Practice Providers (APPs -  Physician Assistants and Nurse Practitioners) who all work together to provide you with the care you need, when you need it.  We recommend signing up for the patient portal called "MyChart".  Sign up information is provided on this After Visit Summary.  MyChart is used to connect with patients for Virtual Visits (Telemedicine).  Patients are able to view lab/test results, encounter notes, upcoming appointments, etc.  Non-urgent messages can be sent to your provider as well.   To learn more about what you can do with  MyChart, go to [email or web address].    Your next appointment:   2-3 month(s)  The format for your next appointment:   In Person  Provider:   Kirk Ruths, MD

## 2022-09-01 ENCOUNTER — Telehealth: Payer: Self-pay

## 2022-09-01 ENCOUNTER — Ambulatory Visit (HOSPITAL_BASED_OUTPATIENT_CLINIC_OR_DEPARTMENT_OTHER)
Admission: RE | Admit: 2022-09-01 | Discharge: 2022-09-01 | Disposition: A | Discharge status: Home / Self Care | Payer: Medicare Other | Source: Ambulatory Visit | Attending: Student | Admitting: Student

## 2022-09-01 DIAGNOSIS — D509 Iron deficiency anemia, unspecified: Secondary | ICD-10-CM | POA: Diagnosis not present

## 2022-09-01 DIAGNOSIS — R0609 Other forms of dyspnea: Secondary | ICD-10-CM | POA: Diagnosis not present

## 2022-09-01 LAB — CBC
Hematocrit: 40.1 % (ref 37.5–51.0)
Hemoglobin: 13.1 g/dL (ref 13.0–17.7)
MCH: 28.7 pg (ref 26.6–33.0)
MCHC: 32.7 g/dL (ref 31.5–35.7)
MCV: 88 fL (ref 79–97)
Platelets: 249 10*3/uL (ref 150–450)
RBC: 4.56 x10E6/uL (ref 4.14–5.80)
RDW: 14.5 % (ref 11.6–15.4)
WBC: 6.8 10*3/uL (ref 3.4–10.8)

## 2022-09-01 LAB — BASIC METABOLIC PANEL
BUN/Creatinine Ratio: 16 (ref 10–24)
BUN: 17 mg/dL (ref 8–27)
CO2: 21 mmol/L (ref 20–29)
Calcium: 9.8 mg/dL (ref 8.6–10.2)
Chloride: 105 mmol/L (ref 96–106)
Creatinine, Ser: 1.05 mg/dL (ref 0.76–1.27)
Glucose: 103 mg/dL — ABNORMAL HIGH (ref 70–99)
Potassium: 4.7 mmol/L (ref 3.5–5.2)
Sodium: 140 mmol/L (ref 134–144)
eGFR: 74 mL/min/{1.73_m2} (ref >59)

## 2022-09-01 LAB — ECHOCARDIOGRAM COMPLETE
Area-P 1/2: 5.27 cm2
Calc EF: 47.2 %
S' Lateral: 5.1 cm
Single Plane A2C EF: 45.9 %
Single Plane A4C EF: 49.5 %

## 2022-09-01 LAB — MAGNESIUM: Magnesium: 2.3 mg/dL (ref 1.6–2.3)

## 2022-09-01 LAB — TSH: TSH: 2.11 u[IU]/mL (ref 0.450–4.500)

## 2022-09-01 NOTE — Telephone Encounter (Addendum)
Patient returned call  regarding results. Patient had understanding of results.----- Message from Mayra Reel, NP sent at 09/01/2022  7:16 AM EST ----- Please let patient know labs look good. Hemoglobin is improved at 13.1 from last month. Thyroid, electrolytes, kidney function, and magnesium are all normal.   Thank you!

## 2022-09-01 NOTE — Telephone Encounter (Addendum)
Called patient regarding results. Left message for patient to call office.----- Message from Mayra Reel, NP sent at 09/01/2022  7:16 AM EST ----- Please let patient know labs look good. Hemoglobin is improved at 13.1 from last month. Thyroid, electrolytes, kidney function, and magnesium are all normal.   Thank you!

## 2022-09-01 NOTE — Telephone Encounter (Signed)
Patient returning call.

## 2022-09-02 DIAGNOSIS — Z20828 Contact with and (suspected) exposure to other viral communicable diseases: Secondary | ICD-10-CM | POA: Diagnosis not present

## 2022-09-02 DIAGNOSIS — R52 Pain, unspecified: Secondary | ICD-10-CM | POA: Diagnosis not present

## 2022-09-07 ENCOUNTER — Telehealth: Payer: Self-pay

## 2022-09-07 NOTE — Addendum Note (Signed)
Addended by: Patria Mane A on: 09/07/2022 09:50 AM   Modules accepted: Orders

## 2022-09-07 NOTE — Telephone Encounter (Addendum)
Called patient regarding results. Left message for patient to call office.----- Message from Mayra Reel, NP sent at 09/02/2022  3:56 PM EST ----- Please let patient know echo looks good. Heart function is improved from prior echo in 2022 with an EF of 55-60% (improved from 40-45%). There is a mild decrease in how the heart relaxes but this is not unexpected as we age. Let patient know I will forward his note to pharm D to get recommendations regarding holding Eliquis prior to forwarding information to GI.  Thank you!

## 2022-09-12 ENCOUNTER — Ambulatory Visit (INDEPENDENT_AMBULATORY_CARE_PROVIDER_SITE_OTHER): Payer: Medicare Other

## 2022-09-12 DIAGNOSIS — I428 Other cardiomyopathies: Secondary | ICD-10-CM | POA: Diagnosis not present

## 2022-09-12 LAB — CUP PACEART REMOTE DEVICE CHECK
Battery Remaining Longevity: 108 mo
Battery Remaining Percentage: 94 %
Brady Statistic RV Percent Paced: 5 %
Date Time Interrogation Session: 20240101173500
HighPow Impedance: 51 Ohm
Implantable Lead Connection Status: 753985
Implantable Lead Implant Date: 20160308
Implantable Lead Location: 753860
Implantable Lead Model: 295
Implantable Lead Serial Number: 363165
Implantable Pulse Generator Implant Date: 20160308
Lead Channel Impedance Value: 396 Ohm
Lead Channel Pacing Threshold Amplitude: 0.8 V
Lead Channel Pacing Threshold Pulse Width: 0.5 ms
Lead Channel Setting Pacing Amplitude: 2.4 V
Lead Channel Setting Pacing Pulse Width: 0.5 ms
Lead Channel Setting Sensing Sensitivity: 0.6 mV
Pulse Gen Serial Number: 200269

## 2022-09-13 ENCOUNTER — Telehealth: Payer: Self-pay

## 2022-09-13 NOTE — Telephone Encounter (Addendum)
Results seen by patient via mychart.----- Message from Mayra Reel, NP sent at 09/02/2022  3:56 PM EST ----- Please let patient know echo looks good. Heart function is improved from prior echo in 2022 with an EF of 55-60% (improved from 40-45%). There is a mild decrease in how the heart relaxes but this is not unexpected as we age. Let patient know I will forward his note to pharm D to get recommendations regarding holding Eliquis prior to forwarding information to GI.  Thank you!

## 2022-09-18 ENCOUNTER — Telehealth: Payer: Self-pay | Admitting: Nurse Practitioner

## 2022-09-18 NOTE — Telephone Encounter (Signed)
Okay thanks for letting us know, we will work on getting someone else in that spot.  Thanks

## 2022-09-18 NOTE — Telephone Encounter (Signed)
Up to him. I think from flu perspective okay to keep his appointment and that he is not infectious given date of dx, cough is likely post viral at this point. That being said if he does not feel up to it he can reschedule in February. Please let me know what he decides. If he does not keep the appointment I have other patients waiting to be done, please let me know. Thanks

## 2022-09-18 NOTE — Telephone Encounter (Addendum)
Spoke with patient.  He would like to hold off on procedure until he feels better.  Rescheduled pt for 10/24/22 at 1030 am. New instructions sent via my chart.

## 2022-09-18 NOTE — Telephone Encounter (Signed)
Patient called states he is sick with a deep cough has had the Flu and is scheduled for a double procedure on 09/20/2022. Please advise he is not sure if he can still proceed.

## 2022-09-18 NOTE — Telephone Encounter (Signed)
Returned pts call. He states that he had the flu before Christmas and has had a lingering cough and chest congestion since then.  He states that his cough is productive and more so when he takes Mucinex.  He does not have fever or body aches but does have a runny nose as well.  He is calling for advice whether to proceed with prepping for procedure but he states he would prefer to cancel.  Dr. Havery Moros please advise.

## 2022-09-20 ENCOUNTER — Encounter: Payer: Medicare Other | Admitting: Gastroenterology

## 2022-09-21 ENCOUNTER — Other Ambulatory Visit: Payer: Self-pay | Admitting: Family Medicine

## 2022-09-21 DIAGNOSIS — E119 Type 2 diabetes mellitus without complications: Secondary | ICD-10-CM

## 2022-10-17 NOTE — Progress Notes (Signed)
Remote ICD transmission.   

## 2022-10-24 ENCOUNTER — Encounter: Payer: Self-pay | Admitting: Gastroenterology

## 2022-10-24 ENCOUNTER — Ambulatory Visit (AMBULATORY_SURGERY_CENTER): Payer: Medicare Other | Admitting: Gastroenterology

## 2022-10-24 VITALS — BP 128/59 | HR 54 | Temp 97.1°F | Resp 19 | Ht 72.0 in | Wt 242.0 lb

## 2022-10-24 DIAGNOSIS — K319 Disease of stomach and duodenum, unspecified: Secondary | ICD-10-CM

## 2022-10-24 DIAGNOSIS — D123 Benign neoplasm of transverse colon: Secondary | ICD-10-CM | POA: Diagnosis not present

## 2022-10-24 DIAGNOSIS — D509 Iron deficiency anemia, unspecified: Secondary | ICD-10-CM

## 2022-10-24 DIAGNOSIS — Z860100 Personal history of colon polyps, unspecified: Secondary | ICD-10-CM

## 2022-10-24 DIAGNOSIS — K635 Polyp of colon: Secondary | ICD-10-CM | POA: Diagnosis not present

## 2022-10-24 DIAGNOSIS — I4891 Unspecified atrial fibrillation: Secondary | ICD-10-CM | POA: Diagnosis not present

## 2022-10-24 DIAGNOSIS — E119 Type 2 diabetes mellitus without complications: Secondary | ICD-10-CM | POA: Diagnosis not present

## 2022-10-24 DIAGNOSIS — D122 Benign neoplasm of ascending colon: Secondary | ICD-10-CM

## 2022-10-24 DIAGNOSIS — I509 Heart failure, unspecified: Secondary | ICD-10-CM | POA: Diagnosis not present

## 2022-10-24 DIAGNOSIS — Z09 Encounter for follow-up examination after completed treatment for conditions other than malignant neoplasm: Secondary | ICD-10-CM

## 2022-10-24 DIAGNOSIS — D125 Benign neoplasm of sigmoid colon: Secondary | ICD-10-CM

## 2022-10-24 MED ORDER — SODIUM CHLORIDE 0.9 % IV SOLN: 500.0000 mL | Freq: Once | INTRAVENOUS | Status: DC

## 2022-10-24 NOTE — Progress Notes (Signed)
Called to room to assist during endoscopic procedure.  Patient ID and intended procedure confirmed with present staff. Received instructions for my participation in the procedure from the performing physician.  

## 2022-10-24 NOTE — Op Note (Signed)
[facility] Patient Name: Bobby Bradley Procedure Date: 10/24/2022 10:44 AM MRN: [id number] Endoscopist: Remo Lipps P. Havery Moros , MD, [id number] Age: 77 Referring MD:  Date of Birth: 02/28/1946 Gender: Male Account #: [id number] Procedure:                Colonoscopy Indications:              High risk colon cancer surveillance: Personal                            history of colonic polyps - 14 adenomas / sessile                            serrated polyps removed 04/2021, mild iron                            deficiency in the setting of Eliquis Medicines:                Monitored Anesthesia Care Procedure:                Pre-Anesthesia Assessment:                           - Prior to the procedure, a History and Physical                            was performed, and patient medications and                            allergies were reviewed. The patient's tolerance of                            previous anesthesia was also reviewed. The risks                            and benefits of the procedure and the sedation                            options and risks were discussed with the patient.                            All questions were answered, and informed consent                            was obtained. Prior Anticoagulants: The patient has                            taken Eliquis (apixaban), last dose was 2 days                            prior to procedure. ASA Grade Assessment: III - A                            patient with severe systemic disease. After  reviewing the risks and benefits, the patient was                            deemed in satisfactory condition to undergo the                            procedure.                           After obtaining informed consent, the colonoscope                            was passed under direct vision. Throughout the                            procedure, the patient's blood pressure, pulse, and                             oxygen saturations were monitored continuously. The                            CF [id number] was introduced through the anus                            and advanced to the the terminal ileum, with                            identification of the appendiceal orifice and IC                            valve. The colonoscopy was performed without                            difficulty. The patient tolerated the procedure                            well. The quality of the bowel preparation was                            good. The terminal ileum, ileocecal valve,                            appendiceal orifice, and rectum were photographed. Scope In: 10:59:12 AM Scope Out: 11:12:49 AM Scope Withdrawal Time: 0 hours 12 minutes 32 seconds  Total Procedure Duration: 0 hours 13 minutes 37 seconds  Findings:                 The perianal and digital rectal examinations were                            normal.                           The terminal ileum appeared normal.  Three flat and sessile polyps were found in the                            ascending colon. The polyps were 1 to 3 mm in size.                            These polyps were removed with a cold snare.                            Resection and retrieval were complete.                           A 4 mm polyp was found in the transverse colon. The                            polyp was flat. The polyp was removed with a cold                            snare. Resection and retrieval were complete.                           A 3 mm polyp was found in the sigmoid colon. The                            polyp was sessile. The polyp was removed with a                            cold snare. Resection and retrieval were complete.                           Multiple small-mouthed diverticula were found in                            the left colon.                           Internal hemorrhoids were  found during                            retroflexion. The hemorrhoids were small.                           The exam was otherwise without abnormality. Complications:            No immediate complications. Estimated blood loss:                            Minimal. Estimated Blood Loss:     Estimated blood loss was minimal. Impression:               - The examined portion of the ileum was normal.                           - Three 1 to 3 mm  polyps in the ascending colon,                            removed with a cold snare. Resected and retrieved.                           - One 4 mm polyp in the transverse colon, removed                            with a cold snare. Resected and retrieved.                           - One 3 mm polyp in the sigmoid colon, removed with                            a cold snare. Resected and retrieved.                           - Diverticulosis in the left colon.                           - Internal hemorrhoids.                           - The examination was otherwise normal.                           No cause for iron deficiency on colonoscopy. Recommendation:           - Patient has a contact number available for                            emergencies. The signs and symptoms of potential                            delayed complications were discussed with the                            patient. Return to normal activities tomorrow.                            Written discharge instructions were provided to the                            patient.                           - Resume previous diet.                           - Continue present medications.                           - Resume Eliquis tomorrow                           -  Await pathology results.                           - Anticipate repeat colonoscopy in 3 years given                            burden of polyps removed on the last 2 exams,                            should the patient wish to  continue surveillance at                            that time (age 13) Carlota Raspberry. Reinaldo Helt, MD 10/24/2022 11:18:29 AM This report has been signed electronically.

## 2022-10-24 NOTE — Progress Notes (Signed)
[facility] History and Physical   Primary Care Physician:  Bradley, Bobby Filler, MD   Reason for Procedure:   IDA, history of colon polyps  Plan:    EGD and colonoscopy    HPI: Bobby Bradley is a 77 y.o. male  here for EGD and colonoscopy to evaluate iron deficiency noted on labs in November in the setting of Eliquis use. Last colonoscopy 04/2021, 14 polyps removed, recommended a repeat exam in one year. Eliquis held for 2 days for this exam. Last echo showed improved EF of 55-60% from December.   Patient denies any bowel symptoms at this time. No family history of colon cancer known. Otherwise feels well without any cardiopulmonary symptoms.   I have discussed risks / benefits of anesthesia and endoscopic procedure with Bobby Bradley and they wish to proceed with the exams as outlined today.    Past Medical History:  Diagnosis Date   A-fib [facility])    CHF (congestive heart failure) (HCC)    Colon polyps    Diabetes mellitus without complication ([place])    Essential hypertension    Hyperlipidemia    Non-ischemic cardiomyopathy (HCC)    Presence of combination internal cardiac defibrillator (ICD) and pacemaker    Prostate cancer ([place])    Stroke ([place])    TIA- 1994   TIA (transient ischemic attack)     Past Surgical History:  Procedure Laterality Date   BACK SURGERY     COLONOSCOPY     about 10 years. Done in [place] or [place]    ICD IMPLANT  2012   PROSTATECTOMY      Prior to Admission medications   Medication Sig Start Date End Date Taking? Authorizing Provider  amLODipine (NORVASC) 10 MG tablet TAKE 1 TABLET(10 MG) BY MOUTH DAILY 10/26/21  Yes Bobby Perla, MD  carvedilol (COREG) 25 MG tablet TAKE 1 TABLET(25 MG) BY MOUTH TWICE DAILY 10/26/21  Yes Bradley, Bobby Bors, MD  cyclobenzaprine (FLEXERIL) 10 MG tablet Take 1 tablet (10 mg total) by mouth 2 (two) times daily as needed for muscle spasms. 02/17/22  Yes Bobby Salvage, FNP   losartan (COZAAR) 100 MG tablet TAKE 1 TABLET(100 MG) BY MOUTH DAILY 10/26/21  Yes Bobby Perla, MD  metFORMIN (GLUCOPHAGE) 500 MG tablet TAKE 1 TABLET(500 MG) BY MOUTH TWICE DAILY WITH A MEAL 09/21/22  Yes Bradley, Bobby Filler, MD  Multiple Vitamin (MULTI VITAMIN DAILY PO) Take 1 tablet by mouth daily.   Yes [provider]  rosuvastatin (CRESTOR) 20 MG tablet TAKE 1 TABLET(20 MG) BY MOUTH DAILY 10/28/21  Yes Bradley, Bobby Filler, MD  spironolactone (ALDACTONE) 25 MG tablet Take 1 tablet (25 mg total) by mouth daily. 10/26/21  Yes Bobby Perla, MD  apixaban (ELIQUIS) 5 MG TABS tablet TAKE 1 TABLET(5 MG) BY MOUTH TWICE DAILY 10/26/21   Bobby Perla, MD  ferrous sulfate 325 (65 FE) MG tablet Take 1 tablet (325 mg total) by mouth daily with breakfast. 07/26/22   Bobby Bradley, Bobby Raspberry, MD  fluticasone (FLONASE) 50 MCG/ACT nasal spray Place 2 sprays into both nostrils daily. Patient taking differently: Place 2 sprays into both nostrils as needed. 12/15/17   Bobby Minium, MD    Current Outpatient Medications  Medication Sig Dispense Refill   amLODipine (NORVASC) 10 MG tablet TAKE 1 TABLET(10 MG) BY MOUTH DAILY 90 tablet 3   carvedilol (COREG) 25 MG tablet TAKE 1 TABLET(25 MG) BY MOUTH TWICE DAILY 180 tablet 3  cyclobenzaprine (FLEXERIL) 10 MG tablet Take 1 tablet (10 mg total) by mouth 2 (two) times daily as needed for muscle spasms. 60 tablet 1   losartan (COZAAR) 100 MG tablet TAKE 1 TABLET(100 MG) BY MOUTH DAILY 90 tablet 3   metFORMIN (GLUCOPHAGE) 500 MG tablet TAKE 1 TABLET(500 MG) BY MOUTH TWICE DAILY WITH A MEAL 180 tablet 3   Multiple Vitamin (MULTI VITAMIN DAILY PO) Take 1 tablet by mouth daily.     rosuvastatin (CRESTOR) 20 MG tablet TAKE 1 TABLET(20 MG) BY MOUTH DAILY 90 tablet 3   spironolactone (ALDACTONE) 25 MG tablet Take 1 tablet (25 mg total) by mouth daily. 90 tablet 3   apixaban (ELIQUIS) 5 MG TABS tablet TAKE 1 TABLET(5 MG) BY MOUTH TWICE DAILY 180 tablet 3    ferrous sulfate 325 (65 FE) MG tablet Take 1 tablet (325 mg total) by mouth daily with breakfast. 30 tablet 3   fluticasone (FLONASE) 50 MCG/ACT nasal spray Place 2 sprays into both nostrils daily. (Patient taking differently: Place 2 sprays into both nostrils as needed.) 16 g 6   Current Facility-Administered Medications  Medication Dose Route Frequency Provider Last Rate Last Admin   0.9 %  sodium chloride infusion  500 mL Intravenous Once Bobby Bradley, Bobby Raspberry, MD        Allergies as of 10/24/2022 - Review Complete 10/24/2022  Allergen Reaction Noted   Lisinopril  03/22/2012    Family History  Problem Relation Age of Onset   Lung cancer Mother    Uterine cancer Mother    Lung cancer Father    Atrial fibrillation Sister    Lung cancer Brother    Colon cancer Neg Hx    Esophageal cancer Neg Hx    Pancreatic cancer Neg Hx    Liver disease Neg Hx    Stomach cancer Neg Hx    Rectal cancer Neg Hx     Social History   Socioeconomic History   Marital status: Married    Spouse name: Not on file   Number of children: 7   Years of education: Not on file   Highest education level: Not on file  Occupational History   Occupation: Art therapist   Tobacco Use   Smoking status: Former    Types: Cigarettes    Quit date: 09/12/1983    Years since quitting: 39.1    Passive exposure: Never   Smokeless tobacco: Never  Vaping Use   Vaping Use: Never used  Substance and Sexual Activity   Alcohol use: Yes    Comment: very rare   Drug use: No   Sexual activity: Not Currently  Other Topics Concern   Not on file  Social History Narrative   Not on file   Social Determinants of Health   Financial Resource Strain: Low Risk  (07/13/2021)   Overall Financial Resource Strain (CARDIA)    Difficulty of Paying Living Expenses: Not hard at all  Food Insecurity: No Food Insecurity (07/13/2021)   Hunger Vital Sign    Worried About Running Out of Food in the Last Year: Never true     Ran Out of Food in the Last Year: Never true  Transportation Needs: No Transportation Needs (07/13/2021)   PRAPARE - Hydrologist (Medical): No    Lack of Transportation (Non-Medical): No  Physical Activity: Inactive (07/13/2021)   Exercise Vital Sign    Days of Exercise per Week: 0 days    Minutes of Exercise  per Session: 0 min  Stress: No Stress Concern Present (07/13/2021)   [place]    Feeling of Stress : Not at all  Social Connections: [place] (07/13/2021)   Social Connection and Isolation Panel [NHANES]    Frequency of Communication with Friends and Family: More than three times a week    Frequency of Social Gatherings with Friends and Family: More than three times a week    Attends Religious Services: More than 4 times per year    Active Member of [employer] or Organizations: Yes    Attends Music therapist: More than 4 times per year    Marital Status: Married  Human resources officer Violence: Not At Risk (07/13/2021)   Humiliation, Afraid, Rape, and Kick questionnaire    Fear of Current or Ex-Partner: No    Emotionally Abused: No    Physically Abused: No    Sexually Abused: No    Review of Systems: All other review of systems negative except as mentioned in the HPI.  Physical Exam: Vital signs BP (!) 131/57   Pulse (!) 56   Temp (!) 97.1 F (36.2 C)   Ht 6' (1.829 m)   Wt 242 lb (109.8 kg)   SpO2 94%   BMI 32.82 kg/m   General:   Alert,  Well-developed, pleasant and cooperative in NAD Lungs:  Clear throughout to auscultation.   Heart:  Regular rate and rhythm Abdomen:  Soft, nontender and nondistended.   Neuro/Psych:  Alert and cooperative. Normal mood and affect. A and O x 3  Jolly Mango, MD [facility]

## 2022-10-24 NOTE — Progress Notes (Signed)
To pacu, VSS. Report to [email or web address] 

## 2022-10-24 NOTE — Patient Instructions (Addendum)
Thank you for letting us take care of your healthcare needs today. Please see handouts given to you on Polyps, Diverticulosis and Hiatal Hernia, Hemorrhoids. You may Resume Eliquis tomorrow.    YOU HAD AN ENDOSCOPIC PROCEDURE TODAY AT [facility]:   Refer to the procedure report that was given to you for any specific questions about what was found during the examination.  If the procedure report does not answer your questions, please call your gastroenterologist to clarify.  If you requested that your care partner not be given the details of your procedure findings, then the procedure report has been included in a sealed envelope for you to review at your convenience later.  YOU SHOULD EXPECT: Some feelings of bloating in the abdomen. Passage of more gas than usual.  Walking can help get rid of the air that was put into your GI tract during the procedure and reduce the bloating. If you had a lower endoscopy (such as a colonoscopy or flexible sigmoidoscopy) you may notice spotting of blood in your stool or on the toilet paper. If you underwent a bowel prep for your procedure, you may not have a normal bowel movement for a few days.  Please Note:  You might notice some irritation and congestion in your nose or some drainage.  This is from the oxygen used during your procedure.  There is no need for concern and it should clear up in a day or so.  SYMPTOMS TO REPORT IMMEDIATELY:  Following lower endoscopy (colonoscopy or flexible sigmoidoscopy):  Excessive amounts of blood in the stool  Significant tenderness or worsening of abdominal pains  Swelling of the abdomen that is new, acute  Fever of 100F or higher  Following upper endoscopy (EGD)  Vomiting of blood or coffee ground material  New chest pain or pain under the shoulder blades  Painful or persistently difficult swallowing  New shortness of breath  Fever of 100F or higher  Black, tarry-looking stools  For urgent or  emergent issues, a gastroenterologist can be reached at any hour by calling [phone]. Do not use MyChart messaging for urgent concerns.    DIET:  We do recommend a small meal at first, but then you may proceed to your regular diet.  Drink plenty of fluids but you should avoid alcoholic beverages for 24 hours.  ACTIVITY:  You should plan to take it easy for the rest of today and you should NOT DRIVE or use heavy machinery until tomorrow (because of the sedation medicines used during the test).    FOLLOW UP: Our staff will call the number listed on your records the next business day following your procedure.  We will call around 7:15- 8:00 am to check on you and address any questions or concerns that you may have regarding the information given to you following your procedure. If we do not reach you, we will leave a message.     If any biopsies were taken you will be contacted by phone or by letter within the next 1-3 weeks.  Please call us at [phone] if you have not heard about the biopsies in 3 weeks.    SIGNATURES/CONFIDENTIALITY: You and/or your care partner have signed paperwork which will be entered into your electronic medical record.  These signatures attest to the fact that that the information above on your After Visit Summary has been reviewed and is understood.  Full responsibility of the confidentiality of this discharge information lies with you and/or  your care-partner. 

## 2022-10-24 NOTE — Op Note (Signed)
[facility] Patient Name: Bobby Bradley Procedure Date: 10/24/2022 10:45 AM MRN: [id number] Endoscopist: Remo Lipps P. Havery Moros , MD, [id number] Age: 77 Referring MD:  Date of Birth: 05-22-1946 Gender: Male Account #: [id number] Procedure:                Upper GI endoscopy Indications:              Iron deficiency anemia in the setting of Eliquis                            use - no upper tract symptoms Medicines:                Monitored Anesthesia Care Procedure:                Pre-Anesthesia Assessment:                           - Prior to the procedure, a History and Physical                            was performed, and patient medications and                            allergies were reviewed. The patient's tolerance of                            previous anesthesia was also reviewed. The risks                            and benefits of the procedure and the sedation                            options and risks were discussed with the patient.                            All questions were answered, and informed consent                            was obtained. Prior Anticoagulants: The patient has                            taken Eliquis (apixaban), last dose was 2 days                            prior to procedure. ASA Grade Assessment: III - A                            patient with severe systemic disease. After                            reviewing the risks and benefits, the patient was                            deemed in satisfactory condition to undergo the  procedure.                           After obtaining informed consent, the endoscope was                            passed under direct vision. Throughout the                            procedure, the patient's blood pressure, pulse, and                            oxygen saturations were monitored continuously. The                            Olympus Scope [id number] was introduced  through the                            mouth, and advanced to the second part of duodenum.                            The upper GI endoscopy was accomplished without                            difficulty. The patient tolerated the procedure                            well. Scope In: Scope Out: Findings:                 Esophagogastric landmarks were identified: the                            Z-line was found at 45 cm, the gastroesophageal                            junction was found at 45 cm and the upper extent of                            the gastric folds was found at 47 cm from the                            incisors.                           A 2 cm hiatal hernia was present.                           The exam of the esophagus was otherwise normal.                           The entire examined stomach was normal. Biopsies                            were taken with a cold forceps for Helicobacter  pylori testing given iron deficiency anemia                           The examined duodenum was normal. Complications:            No immediate complications. Estimated blood loss:                            Minimal. Estimated Blood Loss:     Estimated blood loss was minimal. Impression:               - Esophagogastric landmarks identified.                           - 2 cm hiatal hernia.                           - Normal esophagus otherwise.                           - Normal stomach. Biopsied to assess for H pylori.                           - Normal examined duodenum.                           No overt cause for iron deficiency on this exam -                            will await biopsy results. Recommendation:           - Patient has a contact number available for                            emergencies. The signs and symptoms of potential                            delayed complications were discussed with the                            patient. Return to  normal activities tomorrow.                            Written discharge instructions were provided to the                            patient.                           - Resume previous diet.                           - Continue present medications.                           - Resume Eliquis tomorrow per colonoscopy note.                           -  Await pathology results. Remo Lipps P. Nishawn Rotan, MD 10/24/2022 11:24:20 AM This report has been signed electronically.

## 2022-10-25 ENCOUNTER — Telehealth: Payer: Self-pay

## 2022-10-25 NOTE — Telephone Encounter (Signed)
  Follow up Call-     10/24/2022   10:03 AM 05/10/2021    7:37 AM  Call back number  Post procedure Call Back phone  # [phone]  Permission to leave phone message Yes Yes     Patient questions:  Do you have a fever, pain , or abdominal swelling? No. Pain Score  0 *  Have you tolerated food without any problems? Yes.    Have you been able to return to your normal activities? Yes.    Do you have any questions about your discharge instructions: Diet   No. Medications  No. Follow up visit  No.  Do you have questions or concerns about your Care? No.  Actions: * If pain score is 4 or above: No action needed, pain <4.

## 2022-11-01 ENCOUNTER — Other Ambulatory Visit: Payer: Self-pay

## 2022-11-01 DIAGNOSIS — D509 Iron deficiency anemia, unspecified: Secondary | ICD-10-CM

## 2022-11-03 ENCOUNTER — Other Ambulatory Visit: Payer: Self-pay | Admitting: Cardiology

## 2022-11-03 DIAGNOSIS — I10 Essential (primary) hypertension: Secondary | ICD-10-CM

## 2022-11-05 ENCOUNTER — Other Ambulatory Visit: Payer: Self-pay | Admitting: Cardiology

## 2022-11-05 DIAGNOSIS — E785 Hyperlipidemia, unspecified: Secondary | ICD-10-CM

## 2022-11-05 NOTE — Patient Instructions (Addendum)
Good to see you again today Recommend shingrix series and covid booster if not up to date  Let me know if any sign of infection from the skin duct we cleared out today

## 2022-11-05 NOTE — Progress Notes (Addendum)
[place] [facility] [address], [place], [place] [address] [phone]  Date:  11/13/2022   Name:  Bobby Bradley   DOB:  Mar 22, 1946   MRN:  [id number]  PCP:  Darreld Mclean, MD    Chief Complaint: Annual Exam (Concerns/ questions: CPE & check another growth above my left eye( similar to the one Dr. Edilia Bo removed from above my right eye, that I would like to be evaluated and removed, if possible.) Karie Mainland and Urine Albumin due/Foot exam/Eye exam: none recent/Flu shot today: will think about it. //)   History of Present Illness:  Bobby Bradley is a 77 y.o. very pleasant male patient who presents with the following:  Pt seen today for a routine follow-up-Medicare  Last seen by myself virtually last month - prior to that last visit was in 2022 History of well-controlled diabetes, cardiomyopathy with ICD, TIA, A. fib on anticoagulation Eliquis, CHF, ICD in place, hypertension, prostate cancer s/p prostatectomy   Cardiology care through Drs Stanford Breed and [facility] can be updated- TSH and CBC are UTD   Lab Results  Component Value Date   HGBA1C 6.8 (H) 10/26/2021   Needs foot exam Urine micro due Flu shot-done in December Shingrix Covid booster -completed in December  Gastro is Dr Havery Moros. Pt notes upper GI and colon done - they are talking about doing a capsule endoscopy it sounds like He is not seeing blood in his stool now however   His 76 year old granddaughter is admitted at [facility] with a sinus infection which caused an abscess behind her eye.  We are hoping she will be discharged home soon, she has transitioned to oral antibiotics   Patient Active Problem List   Diagnosis Date Noted   Bronchitis 08/17/2018   Controlled type 2 diabetes mellitus without complication, without long-term current use of insulin ([place]) 02/06/2018   Cardiomyopathy ([place]) 04/16/2017   History of TIA (transient  ischemic attack) 04/16/2017   Chronic anticoagulation 04/16/2017   Paroxysmal atrial fibrillation (HCC) 99991111   Chronic systolic congestive heart failure ([place]) 10/11/2016   ICD (implantable cardioverter-defibrillator) in place 10/11/2016   History of prostate cancer 10/11/2016   Essential hypertension 10/11/2016    Past Medical History:  Diagnosis Date   A-fib [facility])    CHF (congestive heart failure) ([place])    Colon polyps    Diabetes mellitus without complication ([place])    Essential hypertension    Hyperlipidemia    Non-ischemic cardiomyopathy ([place])    Presence of combination internal cardiac defibrillator (ICD) and pacemaker    Prostate cancer ([place])    Stroke ([place])    TIA- 1994   TIA (transient ischemic attack)     Past Surgical History:  Procedure Laterality Date   BACK SURGERY     COLONOSCOPY     about 10 years. Done in [place] or [place]    ICD IMPLANT  2012   PROSTATECTOMY      Social History   Tobacco Use   Smoking status: Former    Types: Cigarettes    Quit date: 09/12/1983    Years since quitting: 39.1    Passive exposure: Never   Smokeless tobacco: Never  Vaping Use   Vaping Use: Never used  Substance Use Topics   Alcohol use: Yes    Comment: very rare   Drug use: No    Family History  Problem Relation Age of Onset   Lung  cancer Mother    Uterine cancer Mother    Lung cancer Father    Atrial fibrillation Sister    Lung cancer Brother    Colon cancer Neg Hx    Esophageal cancer Neg Hx    Pancreatic cancer Neg Hx    Liver disease Neg Hx    Stomach cancer Neg Hx    Rectal cancer Neg Hx     Allergies  Allergen Reactions   Lisinopril     Extreme diarrhea    Medication list has been reviewed and updated.  Current Outpatient Medications on File Prior to Visit  Medication Sig Dispense Refill   amLODipine (NORVASC) 10 MG tablet TAKE 1 TABLET(10 MG) BY MOUTH DAILY 90 tablet 3   apixaban (ELIQUIS) 5 MG TABS tablet TAKE 1  TABLET(5 MG) BY MOUTH TWICE DAILY 180 tablet 1   carvedilol (COREG) 25 MG tablet TAKE 1 TABLET(25 MG) BY MOUTH TWICE DAILY 180 tablet 0   cyclobenzaprine (FLEXERIL) 10 MG tablet Take 1 tablet (10 mg total) by mouth 2 (two) times daily as needed for muscle spasms. 60 tablet 1   ferrous sulfate 325 (65 FE) MG tablet Take 1 tablet (325 mg total) by mouth daily with breakfast. 30 tablet 3   losartan (COZAAR) 100 MG tablet TAKE 1 TABLET(100 MG) BY MOUTH DAILY 90 tablet 3   metFORMIN (GLUCOPHAGE) 500 MG tablet TAKE 1 TABLET(500 MG) BY MOUTH TWICE DAILY WITH A MEAL 180 tablet 3   Multiple Vitamin (MULTI VITAMIN DAILY PO) Take 1 tablet by mouth daily.     rosuvastatin (CRESTOR) 20 MG tablet TAKE 1 TABLET(20 MG) BY MOUTH DAILY 90 tablet 3   spironolactone (ALDACTONE) 25 MG tablet Take 1 tablet (25 mg total) by mouth daily. 90 tablet 3   Current Facility-Administered Medications on File Prior to Visit  Medication Dose Route Frequency Provider Last Rate Last Admin   0.9 %  sodium chloride infusion  500 mL Intravenous Once Armbruster, Carlota Raspberry, MD        Review of Systems:  As per HPI- otherwise negative.  Physical Examination: Vitals:   11/13/22 1315  BP: 130/70  Pulse: 60  Resp: 18  Temp: 97.6 F (36.4 C)  SpO2: 96%   Vitals:   11/13/22 1315  Weight: 248 lb (112.5 kg)  Height: 6' (1.829 m)   Body mass index is 33.63 kg/m. Ideal Body Weight: Weight in (lb) to have BMI = 25: 183.9  GEN: no acute distress.  Obese, looks well  HEENT: Atraumatic, Normocephalic.  Bilateral TM wnl, oropharynx normal.  PEERL,EOMI.   Ears and Nose: No external deformity. CV: RRR, No M/G/R. No JVD. No thrill. No extra heart sounds. PULM: CTA B, no wheezes, crackles, rhonchi. No retractions. No resp. distress. No accessory muscle use. ABD: S, NT, ND, +BS. No rebound. No HSM. EXTR: No c/c/e PSYCH: Normally interactive. Conversant.  Foot exam: Normal bilaterally Milia on the left upper bridge of nose -patient  would like this removed if possible.  Verbal consent obtained.  Clean skin over milliliter with an alcohol pad.  Punctured skin with a 21-gauge needle, expressed sebaceous material from milia.  Patient tolerated well with no complications  Assessment and Plan: Dyslipidemia - Plan: Lipid panel  Controlled type 2 diabetes mellitus without complication, without long-term current use of insulin (HCC) - Plan: Comprehensive metabolic panel, Hemoglobin A1c, Microalbumin / creatinine urine ratio  Essential hypertension - Plan: amLODipine (NORVASC) 10 MG tablet, losartan (COZAAR) 100 MG tablet  Mild  anemia  Prostate cancer ([place]) - Plan: PSA  Chronic anticoagulation  Patient seen today for follow-up Lab work is pending as above Blood pressure well-controlled, refilled blood pressure medication Diabetes has been well-controlled, check labs today He is no longer seeing urology, I will check his PSA for prostate cancer status posttreatment Blood in stool is being evaluated by gastroenterology-CBC was checked about a week ago, hemoglobin is stable Signed Lamar Blinks, MD  Received lab work as below, message to patient Results for orders placed or performed in visit on 11/13/22  Comprehensive metabolic panel  Result Value Ref Range   Sodium 139 135 - 145 mEq/L   Potassium 4.4 3.5 - 5.1 mEq/L   Chloride 102 96 - 112 mEq/L   CO2 28 19 - 32 mEq/L   Glucose, Bld 101 (H) 70 - 99 mg/dL   BUN 17 6 - 23 mg/dL   Creatinine, Ser 1.00 0.40 - 1.50 mg/dL   Total Bilirubin 0.5 0.2 - 1.2 mg/dL   Alkaline Phosphatase 87 39 - 117 U/L   AST 15 0 - 37 U/L   ALT 19 0 - 53 U/L   Total Protein 7.0 6.0 - 8.3 g/dL   Albumin 4.2 3.5 - 5.2 g/dL   GFR 73.69 >60.00 mL/min   Calcium 10.1 8.4 - 10.5 mg/dL  Hemoglobin A1c  Result Value Ref Range   Hgb A1c MFr Bld 6.9 (H) 4.6 - 6.5 %  Lipid panel  Result Value Ref Range   Cholesterol 124 0 - 200 mg/dL   Triglycerides 321.0 (H) 0.0 - 149.0 mg/dL   HDL 32.30 (L)  >39.00 mg/dL   VLDL 64.2 (H) 0.0 - 40.0 mg/dL   Total CHOL/HDL Ratio 4    NonHDL 91.41   PSA  Result Value Ref Range   PSA 0.00 Repeated and verified X2. (L) 0.10 - 4.00 ng/mL  Microalbumin / creatinine urine ratio  Result Value Ref Range   Microalb, Ur 2.5 (H) 0.0 - 1.9 mg/dL   Creatinine,U 119.8 mg/dL   Microalb Creat Ratio 2.0 0.0 - 30.0 mg/g  LDL cholesterol, direct  Result Value Ref Range   Direct LDL 55.0 mg/dL

## 2022-11-06 ENCOUNTER — Other Ambulatory Visit (INDEPENDENT_AMBULATORY_CARE_PROVIDER_SITE_OTHER): Payer: Medicare Other

## 2022-11-06 DIAGNOSIS — D509 Iron deficiency anemia, unspecified: Secondary | ICD-10-CM

## 2022-11-06 LAB — CBC WITH DIFFERENTIAL/PLATELET
Basophils Absolute: 0 10*3/uL (ref 0.0–0.1)
Basophils Relative: 0.4 % (ref 0.0–3.0)
Eosinophils Absolute: 0.3 10*3/uL (ref 0.0–0.7)
Eosinophils Relative: 4.1 % (ref 0.0–5.0)
HCT: 38.8 % — ABNORMAL LOW (ref 39.0–52.0)
Hemoglobin: 13.3 g/dL (ref 13.0–17.0)
Lymphocytes Relative: 20.9 % (ref 12.0–46.0)
Lymphs Abs: 1.6 10*3/uL (ref 0.7–4.0)
MCHC: 34.3 g/dL (ref 30.0–36.0)
MCV: 86.2 fl (ref 78.0–100.0)
Monocytes Absolute: 0.8 10*3/uL (ref 0.1–1.0)
Monocytes Relative: 10.6 % (ref 3.0–12.0)
Neutro Abs: 4.9 10*3/uL (ref 1.4–7.7)
Neutrophils Relative %: 64 % (ref 43.0–77.0)
Platelets: 220 10*3/uL (ref 150.0–400.0)
RBC: 4.5 Mil/uL (ref 4.22–5.81)
RDW: 15 % (ref 11.5–15.5)
WBC: 7.7 10*3/uL (ref 4.0–10.5)

## 2022-11-06 LAB — IBC + FERRITIN
Ferritin: 20.7 ng/mL — ABNORMAL LOW (ref 22.0–322.0)
Iron: 72 ug/dL (ref 42–165)
Saturation Ratios: 17.7 % — ABNORMAL LOW (ref 20.0–50.0)
TIBC: 406 ug/dL (ref 250.0–450.0)
Transferrin: 290 mg/dL (ref 212.0–360.0)

## 2022-11-07 ENCOUNTER — Other Ambulatory Visit: Payer: Self-pay

## 2022-11-07 MED ORDER — APIXABAN 5 MG PO TABS: ORAL_TABLET | ORAL | 1 refills | Status: DC

## 2022-11-07 NOTE — Telephone Encounter (Signed)
Received faxed refill request for Eliquis from [facility].  Pt last saw Mayra Reel, NP on 08/31/22, last labs 08/31/22 Creat 1.05, age 77, weight 109.8kg, based on specified criteria pt is on appropriate dosage of Eliquis '5mg'$  BID for afib.  Will refill rx.

## 2022-11-13 ENCOUNTER — Encounter: Payer: Self-pay | Admitting: Family Medicine

## 2022-11-13 ENCOUNTER — Ambulatory Visit (INDEPENDENT_AMBULATORY_CARE_PROVIDER_SITE_OTHER): Payer: Medicare Other | Admitting: Family Medicine

## 2022-11-13 VITALS — BP 130/70 | HR 60 | Temp 97.6°F | Resp 18 | Ht 72.0 in | Wt 248.0 lb

## 2022-11-13 DIAGNOSIS — E119 Type 2 diabetes mellitus without complications: Secondary | ICD-10-CM | POA: Diagnosis not present

## 2022-11-13 DIAGNOSIS — E785 Hyperlipidemia, unspecified: Secondary | ICD-10-CM

## 2022-11-13 DIAGNOSIS — I10 Essential (primary) hypertension: Secondary | ICD-10-CM

## 2022-11-13 DIAGNOSIS — Z7901 Long term (current) use of anticoagulants: Secondary | ICD-10-CM | POA: Diagnosis not present

## 2022-11-13 DIAGNOSIS — D649 Anemia, unspecified: Secondary | ICD-10-CM

## 2022-11-13 DIAGNOSIS — C61 Malignant neoplasm of prostate: Secondary | ICD-10-CM

## 2022-11-13 LAB — COMPREHENSIVE METABOLIC PANEL
ALT: 19 U/L (ref 0–53)
AST: 15 U/L (ref 0–37)
Albumin: 4.2 g/dL (ref 3.5–5.2)
Alkaline Phosphatase: 87 U/L (ref 39–117)
BUN: 17 mg/dL (ref 6–23)
CO2: 28 mEq/L (ref 19–32)
Calcium: 10.1 mg/dL (ref 8.4–10.5)
Chloride: 102 mEq/L (ref 96–112)
Creatinine, Ser: 1 mg/dL (ref 0.40–1.50)
GFR: 73.69 mL/min (ref >60.00)
Glucose, Bld: 101 mg/dL — ABNORMAL HIGH (ref 70–99)
Potassium: 4.4 mEq/L (ref 3.5–5.1)
Sodium: 139 mEq/L (ref 135–145)
Total Bilirubin: 0.5 mg/dL (ref 0.2–1.2)
Total Protein: 7 g/dL (ref 6.0–8.3)

## 2022-11-13 LAB — PSA: PSA: 0 ng/mL — ABNORMAL LOW (ref 0.10–4.00)

## 2022-11-13 LAB — LIPID PANEL
Cholesterol: 124 mg/dL (ref 0–200)
HDL: 32.3 mg/dL — ABNORMAL LOW (ref >39.00)
NonHDL: 91.41
Total CHOL/HDL Ratio: 4
Triglycerides: 321 mg/dL — ABNORMAL HIGH (ref 0.0–149.0)
VLDL: 64.2 mg/dL — ABNORMAL HIGH (ref 0.0–40.0)

## 2022-11-13 LAB — HEMOGLOBIN A1C: Hgb A1c MFr Bld: 6.9 % — ABNORMAL HIGH (ref 4.6–6.5)

## 2022-11-13 LAB — LDL CHOLESTEROL, DIRECT: Direct LDL: 55 mg/dL

## 2022-11-13 LAB — MICROALBUMIN / CREATININE URINE RATIO
Creatinine,U: 119.8 mg/dL
Microalb Creat Ratio: 2 mg/g (ref 0.0–30.0)
Microalb, Ur: 2.5 mg/dL — ABNORMAL HIGH (ref 0.0–1.9)

## 2022-11-13 MED ORDER — AMLODIPINE BESYLATE 10 MG PO TABS: ORAL_TABLET | ORAL | 3 refills | Status: DC

## 2022-11-13 MED ORDER — LOSARTAN POTASSIUM 100 MG PO TABS: ORAL_TABLET | ORAL | 3 refills | Status: DC

## 2022-11-21 ENCOUNTER — Ambulatory Visit (HOSPITAL_BASED_OUTPATIENT_CLINIC_OR_DEPARTMENT_OTHER)
Admission: RE | Admit: 2022-11-21 | Discharge: 2022-11-21 | Disposition: A | Discharge status: Home / Self Care | Payer: Medicare Other | Source: Ambulatory Visit | Attending: Family Medicine | Admitting: Family Medicine

## 2022-11-21 ENCOUNTER — Encounter: Payer: Self-pay | Admitting: Family Medicine

## 2022-11-21 DIAGNOSIS — E785 Hyperlipidemia, unspecified: Secondary | ICD-10-CM

## 2022-11-21 DIAGNOSIS — E119 Type 2 diabetes mellitus without complications: Secondary | ICD-10-CM | POA: Insufficient documentation

## 2022-11-21 DIAGNOSIS — I10 Essential (primary) hypertension: Secondary | ICD-10-CM

## 2022-11-28 MED ORDER — ROSUVASTATIN CALCIUM 40 MG PO TABS: 40.0000 mg | ORAL_TABLET | Freq: Every day | ORAL | 3 refills | Status: DC

## 2022-11-28 NOTE — Addendum Note (Signed)
Addended by: Lamar Blinks C on: 11/28/2022 11:14 AM   Modules accepted: Orders

## 2022-12-11 ENCOUNTER — Ambulatory Visit (INDEPENDENT_AMBULATORY_CARE_PROVIDER_SITE_OTHER): Payer: Medicare Other

## 2022-12-11 DIAGNOSIS — I428 Other cardiomyopathies: Secondary | ICD-10-CM

## 2022-12-11 NOTE — Progress Notes (Signed)
HPI: FU atrial fibrillation and CHF. Also with prior ICD. Previously moved from [place]. Patient apparently had significant ectopy in the past and had catheterization revealing no coronary disease. He later had a TIA and was diagnosed with a cardiomyopathy and paroxysmal atrial fibrillation. Abdominal ultrasound February 2018 showed no aneurysm. Echocardiogram December 2023 showed normal LV function, grade 1 diastolic dysfunction, moderate left atrial enlargement.  Calcium score March 2024 835 which was 75th percentile.  Since last seen patient notes worsening dyspnea on exertion.  This does not occur with routine activities but any more vigorous activity causes dyspnea.  No orthopnea, PND, pedal edema, chest pain or syncope.  Current Outpatient Medications  Medication Sig Dispense Refill   amLODipine (NORVASC) 10 MG tablet TAKE 1 TABLET(10 MG) BY MOUTH DAILY 90 tablet 3   apixaban (ELIQUIS) 5 MG TABS tablet TAKE 1 TABLET(5 MG) BY MOUTH TWICE DAILY 180 tablet 1   carvedilol (COREG) 25 MG tablet TAKE 1 TABLET(25 MG) BY MOUTH TWICE DAILY 180 tablet 0   cyclobenzaprine (FLEXERIL) 10 MG tablet Take 1 tablet (10 mg total) by mouth 2 (two) times daily as needed for muscle spasms. 60 tablet 1   ferrous sulfate 325 (65 FE) MG tablet Take 1 tablet (325 mg total) by mouth daily with breakfast. 30 tablet 3   losartan (COZAAR) 100 MG tablet TAKE 1 TABLET(100 MG) BY MOUTH DAILY 90 tablet 3   metFORMIN (GLUCOPHAGE) 500 MG tablet TAKE 1 TABLET(500 MG) BY MOUTH TWICE DAILY WITH A MEAL 180 tablet 3   Multiple Vitamin (MULTI VITAMIN DAILY PO) Take 1 tablet by mouth daily.     rosuvastatin (CRESTOR) 40 MG tablet Take 1 tablet (40 mg total) by mouth daily. 90 tablet 3   spironolactone (ALDACTONE) 25 MG tablet Take 1 tablet (25 mg total) by mouth daily. 90 tablet 3   Current Facility-Administered Medications  Medication Dose Route Frequency Provider Last Rate Last Admin   0.9 %  sodium chloride infusion  500  mL Intravenous Once Armbruster, Willaim Rayas, MD         Past Medical History:  Diagnosis Date   A-fib    CHF (congestive heart failure)    Colon polyps    Diabetes mellitus without complication    Essential hypertension    Hyperlipidemia    Non-ischemic cardiomyopathy    Presence of combination internal cardiac defibrillator (ICD) and pacemaker    Prostate cancer    Stroke    TIA- 1994   TIA (transient ischemic attack)     Past Surgical History:  Procedure Laterality Date   BACK SURGERY     COLONOSCOPY     about 10 years. Done in [place] or [place]    ICD IMPLANT  2012   PROSTATECTOMY      Social History   Socioeconomic History   Marital status: Married    Spouse name: Not on file   Number of children: 7   Years of education: Not on file   Highest education level: Not on file  Occupational History   Occupation: Print production planner   Tobacco Use   Smoking status: Former    Types: Cigarettes    Quit date: 09/12/1983    Years since quitting: 39.2    Passive exposure: Never   Smokeless tobacco: Never  Vaping Use   Vaping Use: Never used  Substance and Sexual Activity   Alcohol use: Yes    Comment: very rare   Drug use:  No   Sexual activity: Not Currently  Other Topics Concern   Not on file  Social History Narrative   Not on file   Social Determinants of Health   Financial Resource Strain: Low Risk  (07/13/2021)   Overall Financial Resource Strain (CARDIA)    Difficulty of Paying Living Expenses: Not hard at all  Food Insecurity: No Food Insecurity (07/13/2021)   Hunger Vital Sign    Worried About Running Out of Food in the Last Year: Never true    Ran Out of Food in the Last Year: Never true  Transportation Needs: No Transportation Needs (07/13/2021)   PRAPARE - Administrator, Civil Service (Medical): No    Lack of Transportation (Non-Medical): No  Physical Activity: Inactive (07/13/2021)   Exercise Vital Sign    Days of  Exercise per Week: 0 days    Minutes of Exercise per Session: 0 min  Stress: No Stress Concern Present (07/13/2021)   [employer] of Occupational Health - Occupational Stress Questionnaire    Feeling of Stress : Not at all  Social Connections: Socially Integrated (07/13/2021)   Social Connection and Isolation Panel [NHANES]    Frequency of Communication with Friends and Family: More than three times a week    Frequency of Social Gatherings with Friends and Family: More than three times a week    Attends Religious Services: More than 4 times per year    Active Member of [employer] or Organizations: Yes    Attends Engineer, structural: More than 4 times per year    Marital Status: Married  Catering manager Violence: Not At Risk (07/13/2021)   Humiliation, Afraid, Rape, and Kick questionnaire    Fear of Current or Ex-Partner: No    Emotionally Abused: No    Physically Abused: No    Sexually Abused: No    Family History  Problem Relation Age of Onset   Lung cancer Mother    Uterine cancer Mother    Lung cancer Father    Atrial fibrillation Sister    Lung cancer Brother    Colon cancer Neg Hx    Esophageal cancer Neg Hx    Pancreatic cancer Neg Hx    Liver disease Neg Hx    Stomach cancer Neg Hx    Rectal cancer Neg Hx     ROS: no fevers or chills, productive cough, hemoptysis, dysphasia, odynophagia, melena, hematochezia, dysuria, hematuria, rash, seizure activity, orthopnea, PND, pedal edema, claudication. Remaining systems are negative.  Physical Exam: Well-developed well-nourished in no acute distress.  Skin is warm and dry.  HEENT is normal.  Neck is supple.  Chest is clear to auscultation with normal expansion.  Cardiovascular exam is regular rate and rhythm.  Abdominal exam nontender or distended. No masses palpated. Extremities show no edema. neuro grossly intact   A/P  1 paroxysmal atrial fibrillation-patient remains in sinus on examination.  Continue  beta-blocker and apixaban at present dose.  Recent hemoglobin 13.3.  2 history of nonischemic cardiomyopathy-LV function has improved on most recent echocardiogram.  Continue ARB, beta-blocker and spironolactone.  3 hypertension-patient's blood pressure is controlled.  Continue present medical regimen.  4 coronary calcification-calcium score elevated on recent study.  Patient is also complaining of increased dyspnea on exertion.  There is no chest pain but I would like to screen for coronary disease as a potential anginal equivalent.  Will arrange stress PET.  Continue statin.  He is not on aspirin given need for apixaban.  5 ICD-Per EP.  6 history of snoring-we will arrange sleep study to rule out sleep apnea.  7 hyperlipidemia-continue statin.  Olga MillersBrian Natale Thoma, MD

## 2022-12-12 LAB — CUP PACEART REMOTE DEVICE CHECK
Battery Remaining Longevity: 102 mo
Battery Remaining Percentage: 88 %
Brady Statistic RV Percent Paced: 5 %
Date Time Interrogation Session: 20240401041400
HighPow Impedance: 51 Ohm
Implantable Lead Connection Status: 753985
Implantable Lead Implant Date: 20160308
Implantable Lead Location: 753860
Implantable Lead Model: 295
Implantable Lead Serial Number: 363165
Implantable Pulse Generator Implant Date: 20160308
Lead Channel Impedance Value: 387 Ohm
Lead Channel Pacing Threshold Amplitude: 0.8 V
Lead Channel Pacing Threshold Pulse Width: 0.5 ms
Lead Channel Setting Pacing Amplitude: 2.4 V
Lead Channel Setting Pacing Pulse Width: 0.5 ms
Lead Channel Setting Sensing Sensitivity: 0.6 mV
Pulse Gen Serial Number: 200269

## 2022-12-18 ENCOUNTER — Encounter: Payer: Self-pay | Admitting: Cardiology

## 2022-12-18 ENCOUNTER — Ambulatory Visit: Payer: Medicare Other | Attending: Cardiology | Admitting: Cardiology

## 2022-12-18 VITALS — BP 138/72 | HR 72 | Ht 72.0 in | Wt 245.8 lb

## 2022-12-18 DIAGNOSIS — I2584 Coronary atherosclerosis due to calcified coronary lesion: Secondary | ICD-10-CM | POA: Diagnosis not present

## 2022-12-18 DIAGNOSIS — I428 Other cardiomyopathies: Secondary | ICD-10-CM | POA: Insufficient documentation

## 2022-12-18 DIAGNOSIS — Z95810 Presence of automatic (implantable) cardiac defibrillator: Secondary | ICD-10-CM | POA: Insufficient documentation

## 2022-12-18 DIAGNOSIS — I2510 Atherosclerotic heart disease of native coronary artery without angina pectoris: Secondary | ICD-10-CM | POA: Diagnosis not present

## 2022-12-18 DIAGNOSIS — R0609 Other forms of dyspnea: Secondary | ICD-10-CM | POA: Diagnosis not present

## 2022-12-18 DIAGNOSIS — E785 Hyperlipidemia, unspecified: Secondary | ICD-10-CM | POA: Diagnosis not present

## 2022-12-18 DIAGNOSIS — I10 Essential (primary) hypertension: Secondary | ICD-10-CM | POA: Insufficient documentation

## 2022-12-18 DIAGNOSIS — I480 Paroxysmal atrial fibrillation: Secondary | ICD-10-CM | POA: Insufficient documentation

## 2022-12-18 MED ORDER — LOSARTAN POTASSIUM 100 MG PO TABS: ORAL_TABLET | ORAL | 3 refills | Status: DC

## 2022-12-18 MED ORDER — SPIRONOLACTONE 25 MG PO TABS: 25.0000 mg | ORAL_TABLET | Freq: Every day | ORAL | 3 refills | Status: DC

## 2022-12-18 MED ORDER — AMLODIPINE BESYLATE 10 MG PO TABS: ORAL_TABLET | ORAL | 3 refills | Status: DC

## 2022-12-18 NOTE — Patient Instructions (Signed)
Testing/Procedures:  How to Prepare for Your Cardiac PET/CT Stress Test:  1. Please do not take these medications before your test:   Your remaining medications may be taken with water.  2. Nothing to eat or drink, except water, 3 hours prior to arrival time.   NO caffeine/decaffeinated products, or chocolate 12 hours prior to arrival.  3. NO perfume, cologne or lotion  4. Total time is 1 to 2 hours; you may want to bring reading material for the waiting time.  5. Please report to Radiology at the [facility] Main [facility]tr[facility] 30 minutes early for your test.  [address]  , [place] [id number]  Diabetic Preparation:  Hold oral medications. If able to eat breakfast prior to 3 hour fasting, you may take all medications, including your insulin, Do not worry if you miss your breakfast dose of insulin - start at your next meal.  IF YOU THINK YOU MAY BE PREGNANT, OR ARE NURSING PLEASE INFORM THE TECHNOLOGIST.  In preparation for your appointment, medication and supplies will be purchased.  Appointment availability is limited, so if you need to cancel or reschedule, please call the Radiology Department at [phone]  24 hours in advance to avoid a cancellation fee of $100.00  What to Expect After you Arrive:  Once you arrive and check in for your appointment, you will be taken to a preparation room within the Radiology Department.  A technologist or Nurse will obtain your medical history, verify that you are correctly prepped for the exam, and explain the procedure.  Afterwards,  an IV will be started in your arm and electrodes will be placed on your skin for EKG monitoring during the stress portion of the exam. Then you will be escorted to the PET/CT scanner.  There, staff will get you positioned on the scanner and obtain a blood pressure and EKG.  During the exam, you will continue to be connected to the EKG and blood pressure machines.  A small, safe amount of a  radioactive tracer will be injected in your IV to obtain a series of pictures of your heart along with an injection of a stress agent.    After your Exam:  It is recommended that you eat a meal and drink a caffeinated beverage to counter act any effects of the stress agent.  Drink plenty of fluids for the remainder of the day and urinate frequently for the first couple of hours after the exam.  Your doctor will inform you of your test results within 7-10 business days.  For questions about your test or how to prepare for your test, please call: Rockwell AlexandriaSara Wallace, Cardiac Imaging Nurse Navigator  Larey BrickMerle Prescott, Cardiac Imaging Nurse Navigator Office: [phone]    WatchPAT?  Is a FDA cleared portable home sleep study test that uses a watch and 3 points of contact to monitor 7 different channels, including your heart rate, oxygen saturations, body position, snoring, and chest motion.  The study is easy to use from the comfort of your own home and accurately detect sleep apnea.  Before bed, you attach the chest sensor, attached the sleep apnea bracelet to your nondominant hand, and attach the finger probe.  After the study, the raw data is downloaded from the watch and scored for apnea events.   For more information: [email or web address]  Patient Testing Instructions:  Do not put battery into the device until bedtime when you are ready to begin the test. Please call the support number if  you need assistance after following the instructions below: 24 hour support line- [phone] or [facility] support at [phone] (option 2)  Download the IntelWatchPAT One" app through the google play store or App Store  Be sure to turn on or enable access to bluetooth in settlings on your smartphone/ device  Make sure no other bluetooth devices are on and within the vicinity of your smartphone/ device and WatchPAT watch during testing.  Make sure to leave your smart phone/ device plugged in  and charging all night.  When ready for bed:  Follow the instructions step by step in the WatchPAT One App to activate the testing device. For additional instructions, including video instruction, visit the WatchPAT One video on Youtube. You can search for WatchPat One within Youtube (video is 4 minutes and 18 seconds) or enter: https://youtube/watch?v=BCce_vbiwxE Please note: You will be prompted to enter a Pin to connect via bluetooth when starting the test. The PIN will be assigned to you when you receive the test.  The device is disposable, but it recommended that you retain the device until you receive a call letting you know the study has been received and the results have been interpreted.  We will let you know if the study did not transmit to [place] properly after the test is completed. You do not need to call us to confirm the receipt of the test.  Please complete the test within 48 hours of receiving PIN.   Frequently Asked Questions:  What is Watch Dennie Bible one?  A single use fully disposable home sleep apnea testing device and will not need to be returned after completion.  What are the requirements to use WatchPAT one?  The be able to have a successful watchpat one sleep study, you should have your Watch pat one device, your smart phone, watch pat one app, your PIN number and Internet access What type of phone do I need?  You should have a smart phone that uses Android 5.1 and above or any Iphone with IOS 10 and above How can I download the WatchPAT one app?  Based on your device type search for WatchPAT one app either in google play for android devices or APP store for Iphone's Where will I get my PIN for the study?  Your PIN will be provided by your physician's office. It is used for authentication and if you lose/forget your PIN, please reach out to your providers office.  I do not have Internet at home. Can I do WatchPAT one study?  WatchPAT One needs Internet connection throughout the  night to be able to transmit the sleep data. You can use your home/local internet or your cellular's data package. However, it is always recommended to use home/local Internet. It is estimated that between 20MB-30MB will be used with each study.However, the application will be looking for space in the phone to start the study.  What happens if I lose internet or bluetooth connection?  During the internet disconnection, your phone will not be able to transmit the sleep data. All the data, will be stored in your phone. As soon as the internet connection is back on, the phone will being sending the sleep data. During the bluetooth disconnection, WatchPAT one will not be able to to send the sleep data to your phone. Data will be kept in the [facility] one until two devices have bluetooth connection back on. As soon as the connection is back on, WatchPAT one will send the sleep data  to the phone.  How long do I need to wear the WatchPAT one?  After you start the study, you should wear the device at least 6 hours.  How far should I keep my phone from the device?  During the night, your phone should be within 15 feet.  What happens if I leave the room for restroom or other reasons?  Leaving the room for any reason will not cause any problem. As soon as your get back to the room, both devices will reconnect and will continue to send the sleep data. Can I use my phone during the sleep study?  Yes, you can use your phone as usual during the study. But it is recommended to put your watchpat one on when you are ready to go to bed.  How will I get my study results?  A soon as you completed your study, your sleep data will be sent to the provider. They will then share the results with you when they are ready.    Follow-Up: At [facility], you and your health needs are our priority.  As part of our continuing mission to provide you with exceptional heart care, we have created designated Provider Care  Teams.  These Care Teams include your primary Cardiologist (physician) and Advanced Practice Providers (APPs -  Physician Assistants and Nurse Practitioners) who all work together to provide you with the care you need, when you need it.  We recommend signing up for the patient portal called "MyChart".  Sign up information is provided on this After Visit Summary.  MyChart is used to connect with patients for Virtual Visits (Telemedicine).  Patients are able to view lab/test results, encounter notes, upcoming appointments, etc.  Non-urgent messages can be sent to your provider as well.   To learn more about what you can do with MyChart, go to [email or web address].    Your next appointment:   6-8 week(s)  Provider:   [name], MD

## 2022-12-26 ENCOUNTER — Ambulatory Visit (INDEPENDENT_AMBULATORY_CARE_PROVIDER_SITE_OTHER): Payer: Medicare Other | Admitting: Gastroenterology

## 2022-12-26 ENCOUNTER — Encounter: Payer: Self-pay | Admitting: Gastroenterology

## 2022-12-26 VITALS — BP 130/68 | HR 58 | Ht 72.0 in | Wt 246.0 lb

## 2022-12-26 DIAGNOSIS — Z86010 Personal history of colonic polyps: Secondary | ICD-10-CM | POA: Diagnosis not present

## 2022-12-26 DIAGNOSIS — D509 Iron deficiency anemia, unspecified: Secondary | ICD-10-CM | POA: Diagnosis not present

## 2022-12-26 DIAGNOSIS — Z7901 Long term (current) use of anticoagulants: Secondary | ICD-10-CM | POA: Diagnosis not present

## 2022-12-26 MED ORDER — FERROUS SULFATE 325 (65 FE) MG PO TABS: 325.0000 mg | ORAL_TABLET | Freq: Every day | ORAL | 3 refills | Status: AC

## 2022-12-26 NOTE — Progress Notes (Signed)
HPI :  77 year old male here for a follow-up visit.  Recall he has a history of A-fib on Eliquis, CHF, ICD in place, EF 40 to 45%, history of TIA, prostate cancer, history of colon polyps, here for follow-up visit for iron deficiency anemia.  Recall he had 14 precancerous polyps removed in August 2022.  He saw [place] last year to discuss surveillance colonoscopy given burden of adenomas on that exam, he was found to have a very mild iron deficiency with a hemoglobin of 12.8.  We added an upper endoscopy onto the colonoscopy which was done in February of this year.  The upper endoscopy was normal, no cause for iron deficiency, tested negative for H. pylori.  He had a few small polyps on colonoscopy but otherwise no high risk or concerning pathology.  Was placed on oral iron supplementation when diagnosed with his iron deficiency, his hemoglobin normalized in the 13s, ferritin remain just under limit of normal.  He denies any blood in his stools.  No abdominal pains.  No problems with his bowels.  He is eating well, no nausea or vomiting.  He states he ran out of iron and been off it for the past few weeks, he was not sure how long he should continue it.  No routine NSAID use.  He denies having any history of problems with the small intestine, bowel obstructions, or prior surgeries there.   Prior workup: ECHO 12/03/2020: 1. Left ventricular ejection fraction, by estimation, is 40 to 45%. The left ventricle has mildly decreased function. The left ventricle demonstrates global hypokinesis. There is moderate concentric left ventricular hypertrophy. Left ventricular diastolic parameters are indeterminate. 2. Right ventricular systolic function is normal. The right ventricular size is normal. There is normal pulmonary artery systolic pressure. 3. The mitral valve is normal in structure. Trivial mitral valve regurgitation. No evidence of mitral stenosis. 4. The aortic valve is normal in structure. Aortic  valve regurgitation is not visualized. No aortic stenosis is present. 5. The inferior vena cava is normal in size with greater than 50% respiratory variability, suggesting right atrial pressure of 3 mmHg   Colonoscopy 05/10/2021: - The examined portion of the ileum was normal. - Two 3 to 6 mm polyps in the cecum, removed with a cold snare. Resected and retrieved. - One 3 mm polyp in the ascending colon, removed with a cold snare. Resected and retrieved. - Four 3 to 7 mm polyps at the hepatic flexure, removed with a cold snare. Resected and retrieved. - Four 3 to 5 mm polyps in the transverse colon, removed with a cold snare. Resected and retrieved. - Two 3 to 4 mm polyps in the descending colon, removed with a cold snare. Resected and retrieved. - One 4 mm polyp in the sigmoid colon, removed with a cold snare. Resected and retrieved. - Diverticulosis in the sigmoid colon. - Anal papilla(e) were hypertrophied. - Internal hemorrhoids. - The examination was otherwise normal. - MULTIPLE FRAGMENTS OF TUBULAR ADENOMA. - MULTIPLE FRAGMENTS OF SESSILE SERRATED POLYP WITHOUT DYSPLASIA. - NO HIGH-GRADE DYSPLASIA OR MALIGNANCY.   EGD 10/24/2022: - Esophagogastric landmarks were identified: the Z-line was found at 45 cm, the gastroesophageal junction was found at 45 cm and the upper extent of the gastric folds was found at 47 cm from the incisors. Findings: - A 2 cm hiatal hernia was present. - The exam of the esophagus was otherwise normal. - The entire examined stomach was normal. Biopsies were taken with a cold forceps for  Helicobacter pylori testing given iron deficiency anemia - The examined duodenum was normal.  Colonoscopy 10/24/2022: - The perianal and digital rectal examinations were normal. - The terminal ileum appeared normal. - Three flat and sessile polyps were found in the ascending colon. The polyps were 1 to 3 mm in size. These polyps were removed with a cold snare. Resection and  retrieval were complete. - A 4 mm polyp was found in the transverse colon. The polyp was flat. The polyp was removed with a cold snare. Resection and retrieval were complete. - A 3 mm polyp was found in the sigmoid colon. The polyp was sessile. The polyp was removed with a cold snare. Resection and retrieval were complete. - Multiple small-mouthed diverticula were found in the left colon. - Internal hemorrhoids were found during retroflexion. The hemorrhoids were small. - The exam was otherwise without abnormality.  1. Surgical [P], gastric antrum and gastric body - ANTRAL AND OXYNTIC MUCOSA WITH MILD REACTIVE (CHEMICAL) GASTROPATHY - NEGATIVE FOR HELICOBACTER ORGANISMS ON H&E STAIN 2. Surgical [P], colon, ascending, transverse, sigmoid, polyp (5) - TUBULAR ADENOMA(S) (3 FRAGMENTS) - NEGATIVE FOR HIGH-GRADE DYSPLASIA - HYPERPLASTIC POLYP (1 FRAGMENT) - COLONIC MUCOSA WITH SURFACE HYPERPLASTIC CHANGES (MULTIPLE FRAGMENTS) - NEGATIVE FOR MALIGNANCY 2. Multiple deeper levels were examined.  Repeat colon in 3 years   Lab Results  Component Value Date   WBC 7.7 11/06/2022   HGB 13.3 11/06/2022   HCT 38.8 (L) 11/06/2022   MCV 86.2 11/06/2022   PLT 220.0 11/06/2022    Lab Results  Component Value Date   IRON 72 11/06/2022   TIBC 406.0 11/06/2022   FERRITIN 20.7 (L) 11/06/2022        Latest Ref Rng & Units 11/06/2022    9:55 AM 08/31/2022    3:16 PM 07/26/2022    8:48 AM  CBC  WBC 4.0 - 10.5 K/uL 7.7  6.8  6.9   Hemoglobin 13.0 - 17.0 g/dL 96.0  45.4  09.8   Hematocrit 39.0 - 52.0 % 38.8  40.1  37.4   Platelets 150.0 - 400.0 K/uL 220.0  249  227.0         Past Medical History:  Diagnosis Date   A-fib    CHF (congestive heart failure)    Colon polyps    Diabetes mellitus without complication    Essential hypertension    Hyperlipidemia    Non-ischemic cardiomyopathy    Presence of combination internal cardiac defibrillator (ICD) and pacemaker    Prostate cancer     Stroke    TIA- 1994   TIA (transient ischemic attack)      Past Surgical History:  Procedure Laterality Date   BACK SURGERY     COLONOSCOPY     about 10 years. [place] in [place] or [place]    ICD IMPLANT  2012   PROSTATECTOMY     Family History  Problem Relation Age of Onset   Lung cancer Mother    Uterine cancer Mother    Lung cancer Father    Atrial fibrillation Sister    Lung cancer Brother    Colon cancer Neg Hx    Esophageal cancer Neg Hx    Pancreatic cancer Neg Hx    Liver disease Neg Hx    Stomach cancer Neg Hx    Rectal cancer Neg Hx    Social History   Tobacco Use   Smoking status: Former    Types: Cigarettes    Quit date: 09/12/1983  Years since quitting: 39.3    Passive exposure: Never   Smokeless tobacco: Never  Vaping Use   Vaping Use: Never used  Substance Use Topics   Alcohol use: Yes    Comment: very rare   Drug use: No   Current Outpatient Medications  Medication Sig Dispense Refill   amLODipine (NORVASC) 10 MG tablet TAKE 1 TABLET(10 MG) BY MOUTH DAILY 90 tablet 3   apixaban (ELIQUIS) 5 MG TABS tablet TAKE 1 TABLET(5 MG) BY MOUTH TWICE DAILY 180 tablet 1   carvedilol (COREG) 25 MG tablet TAKE 1 TABLET(25 MG) BY MOUTH TWICE DAILY 180 tablet 0   cyclobenzaprine (FLEXERIL) 10 MG tablet Take 1 tablet (10 mg total) by mouth 2 (two) times daily as needed for muscle spasms. 60 tablet 1   ferrous sulfate 325 (65 FE) MG tablet Take 1 tablet (325 mg total) by mouth daily with breakfast. 30 tablet 3   losartan (COZAAR) 100 MG tablet TAKE 1 TABLET(100 MG) BY MOUTH DAILY 90 tablet 3   metFORMIN (GLUCOPHAGE) 500 MG tablet TAKE 1 TABLET(500 MG) BY MOUTH TWICE DAILY WITH A MEAL 180 tablet 3   Multiple Vitamin (MULTI VITAMIN DAILY PO) Take 1 tablet by mouth daily.     rosuvastatin (CRESTOR) 40 MG tablet Take 1 tablet (40 mg total) by mouth daily. 90 tablet 3   spironolactone (ALDACTONE) 25 MG tablet Take 1 tablet (25 mg total) by mouth daily.  90 tablet 3   Current Facility-Administered Medications  Medication Dose Route Frequency Provider Last Rate Last Admin   0.9 %  sodium chloride infusion  500 mL Intravenous Once Hennesy Sobalvarro, Willaim Rayas, MD       Allergies  Allergen Reactions   Lisinopril     Extreme diarrhea     Review of Systems: All systems reviewed and negative except where noted in HPI.   See labs per HPI  Physical Exam: BP 130/68   Pulse (!) 58   Ht 6' (1.829 m)   Wt 246 lb (111.6 kg)   BMI 33.36 kg/m  Constitutional: Pleasant,well-developed, male in no acute distress. Neurological: Alert and oriented to person place and time. Psychiatric: Normal mood and affect. Behavior is normal.   ASSESSMENT: 77 y.o. male here for assessment of the following  1. Iron deficiency anemia, unspecified iron deficiency anemia type   2. Anticoagulated   3. History of colon polyps    Patient has developed a mild iron deficiency in the setting of anticoagulation with Eliquis.  He has no overt bleeding, no GI tract symptoms of bother him.  EGD and colonoscopy in February failed to reveal a clear source of his iron deficiency.  His anemia resolved with iron supplementation but ferritin remains just below the limit of normal with normal overall iron level.  We discussed differential diagnosis, suspect he has had some small bowel source for his iron deficiency.  Per AGA guidelines, if his anemia and iron deficiency resolved with iron we do not necessarily need to pursue further evaluation.  He has run out of iron, I recommend he resume that and take it every day and repeat his labs in 3 months or so.  If he has persistent iron deficiency despite that then would recommend a capsule endoscopy or CT enterography study to further evaluate.  I otherwise offered him the capsule endoscopy now if he is anxious about this, he would prefer to hold off on that and await repeat labs in 3 months or so.  Otherwise given  his significant history of  polyps in the past, due for surveillance colonoscopy 3 years from his last exam.  He agrees with the plan, if he has any overt bleeding/blood in his stools in the interim he needs to let us know.  He is otherwise tolerating the anticoagulation well at this time.   PLAN: - discussed options - will resume ferrous sulfate 325mg  / day  - go to the lab in 3 months for CBC and TIBC ferritin - if he remains iron deficient will do capsule, if it resolves will continue to monitor.  Offered capsule now, he prefers to await course on iron. - colonoscopy 3 years from last exam  Harlin Rain, MD [facility]\

## 2022-12-26 NOTE — Patient Instructions (Signed)
If your blood pressure at your visit was 140/90 or greater, please contact your primary care physician to follow up on this.  _______________________________________________________  If you are age 76 or older, your body mass index should be between 23-30. Your Body mass index is 33.36 kg/m. If this is out of the aforementioned range listed, please consider follow up with your Primary Care Provider.  If you are age 85 or younger, your body mass index should be between 19-25. Your Body mass index is 33.36 kg/m. If this is out of the aformentioned range listed, please consider follow up with your Primary Care Provider.   ________________________________________________________  The [facility] providers would like to encourage you to use [facility] to communicate with providers for non-urgent requests or questions.  Due to long hold times on the telephone, sending your provider a message by [facility] may be a faster and more efficient way to get a response.  Please allow 48 business hours for a response.  Please remember that this is for non-urgent requests.  _______________________________________________________  Due to recent changes in healthcare laws, you may see the results of your imaging and laboratory studies on MyChart before your provider has had a chance to review them.  We understand that in some cases there may be results that are confusing or concerning to you. Not all laboratory results come back in the same time frame and the provider may be waiting for multiple results in order to interpret others.  Please give [place] 48 hours in order for your provider to thoroughly review all the results before contacting the office for clarification of your results.    We have sent the following medications to your pharmacy for you to pick up at your convenience: Ferrous sulfate 325 mg: Take once daily   Please plan to go to the lab in July for labs for CBC and IBC/Ferritin.  You will be due for a  recall colonoscopy in 10-2025. We will send you a reminder in the mail when it gets closer to that time.

## 2023-01-03 ENCOUNTER — Encounter: Payer: Self-pay | Admitting: Cardiology

## 2023-01-06 ENCOUNTER — Encounter (INDEPENDENT_AMBULATORY_CARE_PROVIDER_SITE_OTHER): Payer: Medicare Other | Admitting: Cardiology

## 2023-01-06 DIAGNOSIS — G4733 Obstructive sleep apnea (adult) (pediatric): Secondary | ICD-10-CM

## 2023-01-07 ENCOUNTER — Ambulatory Visit: Payer: Medicare Other | Attending: Cardiology

## 2023-01-07 DIAGNOSIS — G4733 Obstructive sleep apnea (adult) (pediatric): Secondary | ICD-10-CM

## 2023-01-07 NOTE — Procedures (Signed)
SLEEP STUDY REPORT Patient Information Study Date: 01/06/2023 Patient Name: Bobby Bradley Patient ID: [id number] Birth Date: [birth year] Age: 77 Gender: Male BMI: 33.1 (W=244 lb, H=6' 0'') Referring Physician: [name], MD  TEST DESCRIPTION: Home sleep apnea testing was completed using the WatchPat, a Type 1 device, utilizing peripheral arterial tonometry (PAT), chest movement, actigraphy, pulse oximetry, pulse rate, body position and snore. AHI was calculated with apnea and hypopnea using valid sleep time as the denominator. RDI includes apneas, hypopneas, and RERAs. The data acquired and the scoring of sleep and all associated events were performed in accordance with the recommended standards and specifications as outlined in the AASM Manual for the Scoring of Sleep and Associated Events 2.2.0 (2015).   FINDINGS:   1. Mild Obstructive Sleep Apnea with AHI 12.8/hr.   2. No Central Sleep Apnea with pAHIc 0.2/hr.   3. Oxygen desaturations as low as 87%.   4. Severe snoring was present. O2 sats were < 88% for 0.1 min.   5. Total sleep time was 5 hrs and 47 min.   6. 8.4% of total sleep time was spent in REM sleep.   7. Shortened sleep onset latency at 6 min.   8. Shortened REM sleep onset latency at 49 min.   9. Total awakenings were 22.  10. Arrhythmia detection:  None  DIAGNOSIS: Mild Obstructive Sleep Apnea (G47.33)  RECOMMENDATIONS:   1.  Clinical correlation of these findings is necessary.  The decision to treat obstructive sleep apnea (OSA) is usually based on the presence of apnea symptoms or the presence of associated medical conditions such as Hypertension, Congestive Heart Failure, Atrial Fibrillation or Obesity.  The most common symptoms of OSA are snoring, gasping for breath while sleeping, daytime sleepiness and fatigue.   2.  Initiating apnea therapy is recommended given the presence of symptoms and/or associated conditions. Recommend proceeding with one of  the following:     a.  Auto-CPAP therapy with a pressure range of 5-20cm H2O.     b.  An oral appliance (OA) that can be obtained from certain dentists with expertise in sleep medicine.  These are primarily of use in non-obese patients with mild and moderate disease.     c.  An ENT consultation which may be useful to look for specific causes of obstruction and possible treatment options.     d.  If patient is intolerant to PAP therapy, consider referral to ENT for evaluation for hypoglossal nerve stimulator.   3.  Close follow-up is necessary to ensure success with CPAP or oral appliance therapy for maximum benefit.  4.  A follow-up oximetry study on CPAP is recommended to assess the adequacy of therapy and determine the need for supplemental oxygen or the potential need for Bi-level therapy.  An arterial blood gas to determine the adequacy of baseline ventilation and oxygenation should also be considered.  5.  Healthy sleep recommendations include:  adequate nightly sleep (normal 7-9 hrs/night), avoidance of caffeine after noon and alcohol near bedtime, and maintaining a sleep environment that is cool, dark and quiet.  6.  Weight loss for overweight patients is recommended.  Even modest amounts of weight loss can significantly improve the severity of sleep apnea.  7.  Snoring recommendations include:  weight loss where appropriate, side sleeping, and avoidance of alcohol before bed.  8.  Operation of motor vehicle should be avoided when sleepy.  Signature: Armanda Magic, MD; [facility]; Diplomat, American Board of Sleep Medicine Electronically Signed:  01/07/2023 8:52:09 PM

## 2023-01-10 ENCOUNTER — Other Ambulatory Visit: Payer: Self-pay

## 2023-01-10 DIAGNOSIS — G4733 Obstructive sleep apnea (adult) (pediatric): Secondary | ICD-10-CM

## 2023-01-12 NOTE — Progress Notes (Unsigned)
[facility] [address], [address] [place], [place] [address] [phone]  Date:  01/15/2023   Name:  Nathyn Donohoe   DOB:  Dec 14, 1945   MRN:  [id number]  PCP:  Pearline Cables, MD    Chief Complaint: Sinus Problem (sxs x 11 days. has tried dayquid and mucinex. )   History of Present Illness:  Collis Fleming is a 77 y.o. very pleasant male patient who presents with the following:  Pt seen today for a virtual visit for illness Last seen by myself in March  Pt location is home, my location is office Contact with patient via MyChart video, the patient and myself are present on the visit today.  Patient gives consent for virtual visit  History of well-controlled diabetes, cardiomyopathy with ICD, TIA, A. fib on anticoagulation Eliquis, CHF, ICD in place, hypertension, prostate cancer s/p prostatectomy  Pt notes he got "a cold" 10 days ago but symptoms have persisted, he thinks he may have a sinus infection Sx have not wanted to clear up, he is now getting "green discharge" from his nose He did not take a covid test at home as of yet  He does take care of his grands a lot  He has used some mucinex, the sx are not really in his chest-more sinus pressure  He is not aware of any fever, chills, no GI symptoms   Patient Active Problem List   Diagnosis Date Noted   Bronchitis 08/17/2018   Controlled type 2 diabetes mellitus without complication, without long-term current use of insulin (HCC) 02/06/2018   Cardiomyopathy (HCC) 04/16/2017   History of TIA (transient ischemic attack) 04/16/2017   Chronic anticoagulation 04/16/2017   Paroxysmal atrial fibrillation (HCC) 10/11/2016   Chronic systolic congestive heart failure (HCC) 10/11/2016   ICD (implantable cardioverter-defibrillator) in place 10/11/2016   History of prostate cancer 10/11/2016   Essential hypertension 10/11/2016    Past Medical History:  Diagnosis Date    A-fib [facility])    CHF (congestive heart failure) (HCC)    Colon polyps    Diabetes mellitus without complication (HCC)    Essential hypertension    Hyperlipidemia    Non-ischemic cardiomyopathy (HCC)    Presence of combination internal cardiac defibrillator (ICD) and pacemaker    Prostate cancer (HCC)    Stroke (HCC)    TIA- 1994   TIA (transient ischemic attack)     Past Surgical History:  Procedure Laterality Date   BACK SURGERY     COLONOSCOPY     about 10 years. Done in [place] or [place]    ICD IMPLANT  2012   PROSTATECTOMY      Social History   Tobacco Use   Smoking status: Former    Types: Cigarettes    Quit date: 09/12/1983    Years since quitting: 39.3    Passive exposure: Never   Smokeless tobacco: Never  Vaping Use   Vaping Use: Never used  Substance Use Topics   Alcohol use: Yes    Comment: very rare   Drug use: No    Family History  Problem Relation Age of Onset   Lung cancer Mother    Uterine cancer Mother    Lung cancer Father    Atrial fibrillation Sister    Lung cancer Brother    Colon cancer Neg Hx    Esophageal cancer Neg Hx    Pancreatic cancer Neg Hx  Liver disease Neg Hx    Stomach cancer Neg Hx    Rectal cancer Neg Hx     Allergies  Allergen Reactions   Lisinopril     Extreme diarrhea    Medication list has been reviewed and updated.  Current Outpatient Medications on File Prior to Visit  Medication Sig Dispense Refill   amLODipine (NORVASC) 10 MG tablet TAKE 1 TABLET(10 MG) BY MOUTH DAILY 90 tablet 3   apixaban (ELIQUIS) 5 MG TABS tablet TAKE 1 TABLET(5 MG) BY MOUTH TWICE DAILY 180 tablet 1   carvedilol (COREG) 25 MG tablet TAKE 1 TABLET(25 MG) BY MOUTH TWICE DAILY 180 tablet 0   cyclobenzaprine (FLEXERIL) 10 MG tablet Take 1 tablet (10 mg total) by mouth 2 (two) times daily as needed for muscle spasms. 60 tablet 1   ferrous sulfate 325 (65 FE) MG tablet Take 1 tablet (325 mg total) by mouth daily with  breakfast. 90 tablet 3   losartan (COZAAR) 100 MG tablet TAKE 1 TABLET(100 MG) BY MOUTH DAILY 90 tablet 3   metFORMIN (GLUCOPHAGE) 500 MG tablet TAKE 1 TABLET(500 MG) BY MOUTH TWICE DAILY WITH A MEAL 180 tablet 3   Multiple Vitamin (MULTI VITAMIN DAILY PO) Take 1 tablet by mouth daily.     rosuvastatin (CRESTOR) 40 MG tablet Take 1 tablet (40 mg total) by mouth daily. 90 tablet 3   spironolactone (ALDACTONE) 25 MG tablet Take 1 tablet (25 mg total) by mouth daily. 90 tablet 3   Current Facility-Administered Medications on File Prior to Visit  Medication Dose Route Frequency Provider Last Rate Last Admin   0.9 %  sodium chloride infusion  500 mL Intravenous Once Armbruster, Willaim Rayas, MD        Review of Systems:  As per HPI- otherwise negative.   Physical Examination: There were no vitals filed for this visit. There were no vitals filed for this visit. There is no height or weight on file to calculate BMI. Ideal Body Weight:    BP "about 130/70" at home when he checks. Patient observed via MyChart video, he looks well.  No distress, cough, shortness of breath is noted  Assessment and Plan: Acute non-recurrent frontal sinusitis - Plan: amoxicillin (AMOXIL) 500 MG capsule  Patient seen today for virtual visit with frontal sinusitis.  Will start on amoxicillin for 10 days I did encourage him to take a COVID test as he has not done so yet.  He will let me know if he test positive I asked him to alert me if not improving over the next few days, sooner if getting worse  Signed Abbe Amsterdam, MD

## 2023-01-15 ENCOUNTER — Telehealth (INDEPENDENT_AMBULATORY_CARE_PROVIDER_SITE_OTHER): Payer: Medicare Other | Admitting: Family Medicine

## 2023-01-15 DIAGNOSIS — J0110 Acute frontal sinusitis, unspecified: Secondary | ICD-10-CM | POA: Diagnosis not present

## 2023-01-15 MED ORDER — AMOXICILLIN 500 MG PO CAPS
1000.0000 mg | ORAL_CAPSULE | Freq: Two times a day (BID) | ORAL | 0 refills | Status: DC
Start: 2023-01-15 — End: 2023-02-16

## 2023-01-22 NOTE — Progress Notes (Signed)
Remote ICD transmission.   

## 2023-01-25 ENCOUNTER — Telehealth: Payer: Self-pay | Admitting: *Deleted

## 2023-01-25 NOTE — Telephone Encounter (Signed)
The patient has been notified of the result. Left detailed message on voicemail and informed patient to call back..Bobby Bradley, CMA   

## 2023-01-25 NOTE — Telephone Encounter (Signed)
-----   Message from Gaynelle Cage, CMA sent at 01/12/2023 10:56 AM EDT -----  ----- Message ----- From: Quintella Reichert, MD Sent: 01/07/2023   8:54 PM EDT To: [facility]  Please let patient know that they have sleep apnea and recommend treating with CPAP.  Please order an auto CPAP from 4-15cm H2O with heated humidity and mask of choice.  Order overnight pulse ox on CPAP.  Followup with me in 6 weeks.

## 2023-01-26 ENCOUNTER — Other Ambulatory Visit: Payer: Self-pay | Admitting: *Deleted

## 2023-01-26 DIAGNOSIS — R0609 Other forms of dyspnea: Secondary | ICD-10-CM

## 2023-01-29 ENCOUNTER — Telehealth (HOSPITAL_COMMUNITY): Payer: Self-pay | Admitting: *Deleted

## 2023-01-29 NOTE — Telephone Encounter (Signed)
Reaching out to patient to offer assistance regarding upcoming cardiac imaging study; pt verbalizes understanding of appt date/time, parking situation and where to check in, pre-test NPO status, and verified current allergies; name and call back number provided for further questions should they arise  Mieczyslaw Stamas RN Navigator Cardiac Imaging [facility] [phone] office [phone] cell  Patient aware to avoid caffeine 12 hours prior to her cardiac PET scan.  

## 2023-01-30 ENCOUNTER — Ambulatory Visit: Payer: Medicare Other | Admitting: Cardiology

## 2023-01-31 ENCOUNTER — Encounter (HOSPITAL_COMMUNITY)
Admission: RE | Admit: 2023-01-31 | Discharge: 2023-01-31 | Disposition: A | Discharge status: Home / Self Care | Payer: Medicare Other | Source: Ambulatory Visit | Attending: Cardiology | Admitting: Cardiology

## 2023-01-31 ENCOUNTER — Telehealth: Payer: Self-pay | Admitting: *Deleted

## 2023-01-31 DIAGNOSIS — R0609 Other forms of dyspnea: Secondary | ICD-10-CM | POA: Diagnosis not present

## 2023-01-31 DIAGNOSIS — G4733 Obstructive sleep apnea (adult) (pediatric): Secondary | ICD-10-CM

## 2023-01-31 DIAGNOSIS — I10 Essential (primary) hypertension: Secondary | ICD-10-CM

## 2023-01-31 LAB — NM PET CT CARDIAC PERFUSION MULTI W/ABSOLUTE BLOODFLOW
LV dias vol: 195 mL (ref 62–150)
MBFR: 1.25
Nuc Rest EF: 32 %
Nuc Stress EF: 29 %
Peak HR: 82 {beats}/min
Rest HR: 60 {beats}/min
Rest MBF: 0.57 ml/g/min
Rest Nuclear Isotope Dose: 28.7 mCi
Rest perfusion cavity size (mL): 195 mL
ST Depression (mm): 0 mm
Stress MBF: 0.71 ml/g/min
Stress Nuclear Isotope Dose: 28.7 mCi
Stress perfusion cavity size (mL): 203 mL
TID: 1.17

## 2023-01-31 MED ORDER — RUBIDIUM RB82 GENERATOR (RUBYFILL)
28.0000 | PACK | Freq: Once | INTRAVENOUS | Status: AC
  Administered 2023-01-31: 28.7 via INTRAVENOUS

## 2023-01-31 MED ORDER — REGADENOSON 0.4 MG/5ML IV SOLN
0.4000 mg | Freq: Once | INTRAVENOUS | Status: AC
  Administered 2023-01-31: 0.4 mg via INTRAVENOUS

## 2023-01-31 MED ORDER — REGADENOSON 0.4 MG/5ML IV SOLN
INTRAVENOUS | Status: AC
  Filled 2023-01-31: qty 5

## 2023-01-31 NOTE — Telephone Encounter (Signed)
The patient has been notified of the result and verbalized understanding.  All questions (if any) were answered. Bobby Bradley, CMA 01/31/2023 12:49 PM    Patient wants to think about his decision to get the cpap and call us back.

## 2023-01-31 NOTE — Telephone Encounter (Signed)
-----   Message from Wanda M Waddell, CMA sent at 01/12/2023 10:56 AM EDT -----  ----- Message ----- From: Turner, Traci R, MD Sent: 01/07/2023   8:54 PM EDT To: [facility] Sleep Studies  Please let patient know that they have sleep apnea and recommend treating with CPAP.  Please order an auto CPAP from 4-15cm H2O with heated humidity and mask of choice.  Order overnight pulse ox on CPAP.  Followup with me in 6 weeks.    

## 2023-02-01 ENCOUNTER — Other Ambulatory Visit: Payer: Self-pay | Admitting: Cardiology

## 2023-02-01 ENCOUNTER — Telehealth: Payer: Self-pay | Admitting: Cardiology

## 2023-02-01 DIAGNOSIS — I10 Essential (primary) hypertension: Secondary | ICD-10-CM

## 2023-02-01 NOTE — Telephone Encounter (Signed)
Patient returned call

## 2023-02-01 NOTE — H&P (View-Only) (Signed)
   HPI: FU atrial fibrillation and CHF. Also with prior ICD. Previously moved from [place]. Patient apparently had significant ectopy in the past and had catheterization revealing no coronary disease. He later had a TIA and was diagnosed with a cardiomyopathy and paroxysmal atrial fibrillation. Abdominal ultrasound February 2018 showed no aneurysm. Echocardiogram December 2023 showed normal LV function, grade 1 diastolic dysfunction, moderate left atrial enlargement.  Calcium score March 2024 835 which was 75th percentile.  PET scan Jan 31, 2023 showed large reversible defect in the apical inferior, inferolateral, anterolateral segment with ejection fraction dropping from 32% to 29% with exercise; 3 times daily present and study felt to be high risk.  Since last seen he continues to have significant dyspnea on exertion.  There is no orthopnea, PND, pedal edema, chest pain or syncope.  Current Outpatient Medications  Medication Sig Dispense Refill   amLODipine (NORVASC) 10 MG tablet TAKE 1 TABLET(10 MG) BY MOUTH DAILY 90 tablet 3   apixaban (ELIQUIS) 5 MG TABS tablet TAKE 1 TABLET(5 MG) BY MOUTH TWICE DAILY 180 tablet 1   carvedilol (COREG) 25 MG tablet TAKE 1 TABLET(25 MG) BY MOUTH TWICE DAILY 180 tablet 1   cyclobenzaprine (FLEXERIL) 10 MG tablet Take 1 tablet (10 mg total) by mouth 2 (two) times daily as needed for muscle spasms. 60 tablet 1   ferrous sulfate 325 (65 FE) MG tablet Take 1 tablet (325 mg total) by mouth daily with breakfast. 90 tablet 3   losartan (COZAAR) 100 MG tablet TAKE 1 TABLET(100 MG) BY MOUTH DAILY 90 tablet 3   metFORMIN (GLUCOPHAGE) 500 MG tablet TAKE 1 TABLET(500 MG) BY MOUTH TWICE DAILY WITH A MEAL 180 tablet 3   Multiple Vitamin (MULTI VITAMIN DAILY PO) Take 1 tablet by mouth daily.     rosuvastatin (CRESTOR) 40 MG tablet Take 1 tablet (40 mg total) by mouth daily. 90 tablet 3   spironolactone (ALDACTONE) 25 MG tablet Take 1 tablet (25 mg total) by mouth daily. 90  tablet 3   amoxicillin (AMOXIL) 500 MG capsule Take 2 capsules (1,000 mg total) by mouth 2 (two) times daily. 40 capsule 0   Current Facility-Administered Medications  Medication Dose Route Frequency Provider Last Rate Last Admin   0.9 %  sodium chloride infusion  500 mL Intravenous Once Armbruster, Steven P, MD         Past Medical History:  Diagnosis Date   A-fib (HCC)    CHF (congestive heart failure) (HCC)    Colon polyps    Diabetes mellitus without complication (HCC)    Essential hypertension    Hyperlipidemia    Non-ischemic cardiomyopathy (HCC)    Presence of combination internal cardiac defibrillator (ICD) and pacemaker    Prostate cancer (HCC)    Stroke (HCC)    TIA- 1994   TIA (transient ischemic attack)     Past Surgical History:  Procedure Laterality Date   BACK SURGERY     COLONOSCOPY     about 10 years. Done in [place] or [place]    ICD IMPLANT  2012   PROSTATECTOMY      Social History   Socioeconomic History   Marital status: Married    Spouse name: Not on file   Number of children: 7   Years of education: Not on file   Highest education level: Not on file  Occupational History   Occupation: Corporate safety manager   Tobacco Use   Smoking status: Former      Types: Cigarettes    Quit date: 09/12/1983    Years since quitting: 39.4    Passive exposure: Never   Smokeless tobacco: Never  Vaping Use   Vaping Use: Never used  Substance and Sexual Activity   Alcohol use: Yes    Comment: very rare   Drug use: No   Sexual activity: Not Currently  Other Topics Concern   Not on file  Social History Narrative   Not on file   Social Determinants of Health   Financial Resource Strain: Low Risk  (07/13/2021)   Overall Financial Resource Strain (CARDIA)    Difficulty of Paying Living Expenses: Not hard at all  Food Insecurity: No Food Insecurity (07/13/2021)   Hunger Vital Sign    Worried About Running Out of Food in the Last Year:  Never true    Ran Out of Food in the Last Year: Never true  Transportation Needs: No Transportation Needs (07/13/2021)   PRAPARE - Transportation    Lack of Transportation (Medical): No    Lack of Transportation (Non-Medical): No  Physical Activity: Inactive (07/13/2021)   Exercise Vital Sign    Days of Exercise per Week: 0 days    Minutes of Exercise per Session: 0 min  Stress: No Stress Concern Present (07/13/2021)   [employer] - Occupational Stress Questionnaire    Feeling of Stress : Not at all  Social Connections: Socially Integrated (07/13/2021)   Social Connection and Isolation Panel [NHANES]    Frequency of Communication with Friends and Family: More than three times a week    Frequency of Social Gatherings with Friends and Family: More than three times a week    Attends Religious Services: More than 4 times per year    Active Member of Clubs or Organizations: Yes    Attends Club or Organization Meetings: More than 4 times per year    Marital Status: Married  Intimate Partner Violence: Not At Risk (07/13/2021)   Humiliation, Afraid, Rape, and Kick questionnaire    Fear of Current or Ex-Partner: No    Emotionally Abused: No    Physically Abused: No    Sexually Abused: No    Family History  Problem Relation Age of Onset   Lung cancer Mother    Uterine cancer Mother    Lung cancer Father    Atrial fibrillation Sister    Lung cancer Brother    Colon cancer Neg Hx    Esophageal cancer Neg Hx    Pancreatic cancer Neg Hx    Liver disease Neg Hx    Stomach cancer Neg Hx    Rectal cancer Neg Hx     ROS: no fevers or chills, productive cough, hemoptysis, dysphasia, odynophagia, melena, hematochezia, dysuria, hematuria, rash, seizure activity, orthopnea, PND, pedal edema, claudication. Remaining systems are negative.  Physical Exam: Well-developed well-nourished in no acute distress.  Skin is warm and dry.  HEENT is normal.  Neck is supple.   Chest is clear to auscultation with normal expansion.  Cardiovascular exam is regular rate and rhythm.  Abdominal exam nontender or distended. No masses palpated. Extremities show no edema. neuro grossly intact  ECG-normal sinus rhythm with first-degree AV block, normal axis, nonspecific ST changes.  Personally reviewed  A/P  1 coronary calcification/abnormal nuclear study-patient recently seen in the office and does have a history of nonischemic cardiomyopathy.  However he was complaining of worsening dyspnea on exertion and coronary calcium score significantly elevated.  Follow-up PET scan showed   ischemia with reduced LV function and felt to be high risk.  We will arrange right and left cardiac catheterization.  The risk and benefits including myocardial infarction, CVA and death discussed and he agrees to proceed.  Hold apixaban 2 days prior to the procedure and resume after.  Add aspirin 81 mg daily.  2 paroxysmal atrial fibrillation-he is in sinus rhythm today.  Will continue beta-blocker at present dose.  Apixaban as outlined above.  3 nonischemic cardiomyopathy-LV function previously improved but noted to be decreased on PET scan.  Will repeat echocardiogram.  Continue ARB (will transition to Entresto if LV function reduced on follow-up echocardiogram), beta-blocker and spironolactone.  4 history of ICD-managed by electrophysiology.  5 hypertension-blood pressure controlled.  Continue present medications.  6 hyperlipidemia-  7 history of snoring-sleep study is pending.  Devita Nies, MD    

## 2023-02-01 NOTE — Telephone Encounter (Signed)
Called pt. No answer, unable to leave a message.

## 2023-02-01 NOTE — Progress Notes (Signed)
HPI: FU atrial fibrillation and CHF. Also with prior ICD. Previously moved from [place]. Patient apparently had significant ectopy in the past and had catheterization revealing no coronary disease. He later had a TIA and was diagnosed with a cardiomyopathy and paroxysmal atrial fibrillation. Abdominal ultrasound February 2018 showed no aneurysm. Echocardiogram December 2023 showed normal LV function, grade 1 diastolic dysfunction, moderate left atrial enlargement.  Calcium score March 2024 835 which was 75th percentile.  PET scan Jan 31, 2023 showed large reversible defect in the apical inferior, inferolateral, anterolateral segment with ejection fraction dropping from 32% to 29% with exercise; 3 times daily present and study felt to be high risk.  Since last seen he continues to have significant dyspnea on exertion.  There is no orthopnea, PND, pedal edema, chest pain or syncope.  Current Outpatient Medications  Medication Sig Dispense Refill   amLODipine (NORVASC) 10 MG tablet TAKE 1 TABLET(10 MG) BY MOUTH DAILY 90 tablet 3   apixaban (ELIQUIS) 5 MG TABS tablet TAKE 1 TABLET(5 MG) BY MOUTH TWICE DAILY 180 tablet 1   carvedilol (COREG) 25 MG tablet TAKE 1 TABLET(25 MG) BY MOUTH TWICE DAILY 180 tablet 1   cyclobenzaprine (FLEXERIL) 10 MG tablet Take 1 tablet (10 mg total) by mouth 2 (two) times daily as needed for muscle spasms. 60 tablet 1   ferrous sulfate 325 (65 FE) MG tablet Take 1 tablet (325 mg total) by mouth daily with breakfast. 90 tablet 3   losartan (COZAAR) 100 MG tablet TAKE 1 TABLET(100 MG) BY MOUTH DAILY 90 tablet 3   metFORMIN (GLUCOPHAGE) 500 MG tablet TAKE 1 TABLET(500 MG) BY MOUTH TWICE DAILY WITH A MEAL 180 tablet 3   Multiple Vitamin (MULTI VITAMIN DAILY PO) Take 1 tablet by mouth daily.     rosuvastatin (CRESTOR) 40 MG tablet Take 1 tablet (40 mg total) by mouth daily. 90 tablet 3   spironolactone (ALDACTONE) 25 MG tablet Take 1 tablet (25 mg total) by mouth daily. 90  tablet 3   amoxicillin (AMOXIL) 500 MG capsule Take 2 capsules (1,000 mg total) by mouth 2 (two) times daily. 40 capsule 0   Current Facility-Administered Medications  Medication Dose Route Frequency Provider Last Rate Last Admin   0.9 %  sodium chloride infusion  500 mL Intravenous Once Armbruster, Willaim Rayas, MD         Past Medical History:  Diagnosis Date   A-fib [facility])    CHF (congestive heart failure) (HCC)    Colon polyps    Diabetes mellitus without complication (HCC)    Essential hypertension    Hyperlipidemia    Non-ischemic cardiomyopathy (HCC)    Presence of combination internal cardiac defibrillator (ICD) and pacemaker    Prostate cancer (HCC)    Stroke (HCC)    TIA- 1994   TIA (transient ischemic attack)     Past Surgical History:  Procedure Laterality Date   BACK SURGERY     COLONOSCOPY     about 10 years. [place] in [place]    ICD IMPLANT  2012   PROSTATECTOMY      Social History   Socioeconomic History   Marital status: Married    Spouse name: Not on file   Number of children: 7   Years of education: Not on file   Highest education level: Not on file  Occupational History   Occupation: Print production planner   Tobacco Use   Smoking status: Former  Types: Cigarettes    Quit date: 09/12/1983    Years since quitting: 39.4    Passive exposure: Never   Smokeless tobacco: Never  Vaping Use   Vaping Use: Never used  Substance and Sexual Activity   Alcohol use: Yes    Comment: very rare   Drug use: No   Sexual activity: Not Currently  Other Topics Concern   Not on file  Social History Narrative   Not on file   Social Determinants of Health   Financial Resource Strain: Low Risk  (07/13/2021)   Overall Financial Resource Strain (CARDIA)    Difficulty of Paying Living Expenses: Not hard at all  Food Insecurity: No Food Insecurity (07/13/2021)   Hunger Vital Sign    Worried About Running Out of Food in the Last Year:  Never true    Ran Out of Food in the Last Year: Never true  Transportation Needs: No Transportation Needs (07/13/2021)   PRAPARE - Administrator, Civil Service (Medical): No    Lack of Transportation (Non-Medical): No  Physical Activity: Inactive (07/13/2021)   Exercise Vital Sign    Days of Exercise per Week: 0 days    Minutes of Exercise per Session: 0 min  Stress: No Stress Concern Present (07/13/2021)   [employer] of Occupational Health - Occupational Stress Questionnaire    Feeling of Stress : Not at all  Social Connections: Socially Integrated (07/13/2021)   Social Connection and Isolation Panel [NHANES]    Frequency of Communication with Friends and Family: More than three times a week    Frequency of Social Gatherings with Friends and Family: More than three times a week    Attends Religious Services: More than 4 times per year    Active Member of [employer] or Organizations: Yes    Attends Engineer, structural: More than 4 times per year    Marital Status: Married  Catering manager Violence: Not At Risk (07/13/2021)   Humiliation, Afraid, Rape, and Kick questionnaire    Fear of Current or Ex-Partner: No    Emotionally Abused: No    Physically Abused: No    Sexually Abused: No    Family History  Problem Relation Age of Onset   Lung cancer Mother    Uterine cancer Mother    Lung cancer Father    Atrial fibrillation Sister    Lung cancer Brother    Colon cancer Neg Hx    Esophageal cancer Neg Hx    Pancreatic cancer Neg Hx    Liver disease Neg Hx    Stomach cancer Neg Hx    Rectal cancer Neg Hx     ROS: no fevers or chills, productive cough, hemoptysis, dysphasia, odynophagia, melena, hematochezia, dysuria, hematuria, rash, seizure activity, orthopnea, PND, pedal edema, claudication. Remaining systems are negative.  Physical Exam: Well-developed well-nourished in no acute distress.  Skin is warm and dry.  HEENT is normal.  Neck is supple.   Chest is clear to auscultation with normal expansion.  Cardiovascular exam is regular rate and rhythm.  Abdominal exam nontender or distended. No masses palpated. Extremities show no edema. neuro grossly intact  ECG-normal sinus rhythm with first-degree AV block, normal axis, nonspecific ST changes.  Personally reviewed  A/P  1 coronary calcification/abnormal nuclear study-patient recently seen in the office and does have a history of nonischemic cardiomyopathy.  However he was complaining of worsening dyspnea on exertion and coronary calcium score significantly elevated.  Follow-up PET scan showed  ischemia with reduced LV function and felt to be high risk.  We will arrange right and left cardiac catheterization.  The risk and benefits including myocardial infarction, CVA and death discussed and he agrees to proceed.  Hold apixaban 2 days prior to the procedure and resume after.  Add aspirin 81 mg daily.  2 paroxysmal atrial fibrillation-he is in sinus rhythm today.  Will continue beta-blocker at present dose.  Apixaban as outlined above.  3 nonischemic cardiomyopathy-LV function previously improved but noted to be decreased on PET scan.  Will repeat echocardiogram.  Continue ARB (will transition to [facility] if LV function reduced on follow-up echocardiogram), beta-blocker and spironolactone.  4 history of ICD-managed by electrophysiology.  5 hypertension-blood pressure controlled.  Continue present medications.  6 hyperlipidemia-  7 history of snoring-sleep study is pending.  [name], MD

## 2023-02-02 NOTE — Telephone Encounter (Signed)
Call to patient . Unable to LM as voicemail is full 

## 2023-02-06 NOTE — Telephone Encounter (Signed)
Spoke with patient .  He states someone called him, he believes to remind him to set an appt.  He has since scheduled.  Nothing further is needed

## 2023-02-08 ENCOUNTER — Other Ambulatory Visit: Payer: Self-pay | Admitting: *Deleted

## 2023-02-08 ENCOUNTER — Ambulatory Visit: Payer: Medicare Other | Attending: Cardiology | Admitting: Cardiology

## 2023-02-08 ENCOUNTER — Encounter: Payer: Self-pay | Admitting: Cardiology

## 2023-02-08 VITALS — BP 134/64 | HR 61 | Ht 72.0 in | Wt 246.6 lb

## 2023-02-08 DIAGNOSIS — R0609 Other forms of dyspnea: Secondary | ICD-10-CM

## 2023-02-08 DIAGNOSIS — I480 Paroxysmal atrial fibrillation: Secondary | ICD-10-CM | POA: Diagnosis not present

## 2023-02-08 DIAGNOSIS — I2584 Coronary atherosclerosis due to calcified coronary lesion: Secondary | ICD-10-CM | POA: Insufficient documentation

## 2023-02-08 DIAGNOSIS — I428 Other cardiomyopathies: Secondary | ICD-10-CM

## 2023-02-08 DIAGNOSIS — I10 Essential (primary) hypertension: Secondary | ICD-10-CM

## 2023-02-08 DIAGNOSIS — I2510 Atherosclerotic heart disease of native coronary artery without angina pectoris: Secondary | ICD-10-CM | POA: Diagnosis not present

## 2023-02-08 MED ORDER — SODIUM CHLORIDE 0.9% FLUSH
3.0000 mL | Freq: Two times a day (BID) | INTRAVENOUS | Status: DC
Start: 2023-02-08 — End: 2023-02-23

## 2023-02-08 NOTE — Patient Instructions (Signed)
Testing/Procedures:      Cardiac Catheterization   You are scheduled for a Cardiac Catheterization on Monday, June 3 with Dr. Verne Carrow.  1. Please arrive at the [facility] (Main Entrance A) at [facility]: [address] [place], [place] [address] at 7:00 AM (This time is 2 hour(s) before your procedure to ensure your preparation). Free valet parking service is available. You will check in at ADMITTING. The support person will be asked to wait in the waiting room.  It is OK to have someone drop you off and come back when you are ready to be discharged.        Special note: Every effort is made to have your procedure done on time. Please understand that emergencies sometimes delay scheduled procedures.  2. Diet: Do not eat solid foods after midnight.  You may have clear liquids until 5 AM the day of the procedure.  3. Labs: You will need to have blood drawn on TODAY  4. Medication instructions in preparation for your procedure:  TAKE THE LAST DOSE OF ELIQUIS FRIDAY EVENING-TAKE NO ELIQUIS SATURDAY, SUNDAY OR MONDAY  DO NOT TAKE SPIRONOLACTONE THE MORNING OF THE PROCEDURE  DO NOT TAKE METFORMIN THE MORNING OF THE PROCEDURE -DO NOT TAKE TUESDAY OR WEDNESDAY RESTART ON THURSDAY AFTER THE PROCEDURE  TAKE ASPIRIN 81 MG ON SATURDAY, SUNDAY AND MONDAY PRIOR TO THE PROCEDURE  On the morning of your procedure, take Aspirin 81 mg and any morning medicines NOT listed above.  You may use sips of water.  5. Plan to go home the same day, you will only stay overnight if medically necessary. 6. You MUST have a responsible adult to drive you home. 7. An adult MUST be with you the first 24 hours after you arrive home. 8. Bring a current list of your medications, and the last time and date medication taken. 9. Bring ID and current insurance cards. 10.Please wear clothes that are easy to get on and off and wear slip-on shoes.  Thank you for allowing [place] to care for you!   --  [facility]    Your physician has requested that you have an echocardiogram. Echocardiography is a painless test that uses sound waves to create images of your heart. It provides your doctor with information about the size and shape of your heart and how well your heart's chambers and valves are working. This procedure takes approximately one hour. There are no restrictions for this procedure. Please do NOT wear cologne, perfume, aftershave, or lotions (deodorant is allowed). Please arrive 15 minutes prior to your appointment time. [address]   Follow-Up: At [facility], you and your health needs are our priority.  As part of our continuing mission to provide you with exceptional heart care, we have created designated Provider Care Teams.  These Care Teams include your primary Cardiologist (physician) and Advanced Practice Providers (APPs -  Physician Assistants and Nurse Practitioners) who all work together to provide you with the care you need, when you need it.  We recommend signing up for the patient portal called "MyChart".  Sign up information is provided on this After Visit Summary.  MyChart is used to connect with patients for Virtual Visits (Telemedicine).  Patients are able to view lab/test results, encounter notes, upcoming appointments, etc.  Non-urgent messages can be sent to your provider as well.   To learn more about what you can do with MyChart, go to  [email or web address].    Your next appointment:   8 week(s)  Provider:   [name], MD

## 2023-02-09 LAB — CBC
Hematocrit: 38.9 % (ref 37.5–51.0)
Hemoglobin: 13.4 g/dL (ref 13.0–17.7)
MCH: 30.3 pg (ref 26.6–33.0)
MCHC: 34.4 g/dL (ref 31.5–35.7)
MCV: 88 fL (ref 79–97)
Platelets: 212 10*3/uL (ref 150–450)
RBC: 4.42 x10E6/uL (ref 4.14–5.80)
RDW: 13.3 % (ref 11.6–15.4)
WBC: 7.7 10*3/uL (ref 3.4–10.8)

## 2023-02-09 LAB — BASIC METABOLIC PANEL
BUN/Creatinine Ratio: 14 (ref 10–24)
BUN: 14 mg/dL (ref 8–27)
CO2: 25 mmol/L (ref 20–29)
Calcium: 9.6 mg/dL (ref 8.6–10.2)
Chloride: 102 mmol/L (ref 96–106)
Creatinine, Ser: 1 mg/dL (ref 0.76–1.27)
Glucose: 168 mg/dL — ABNORMAL HIGH (ref 70–99)
Potassium: 4.9 mmol/L (ref 3.5–5.2)
Sodium: 139 mmol/L (ref 134–144)
eGFR: 78 mL/min/{1.73_m2} (ref >59)

## 2023-02-12 ENCOUNTER — Ambulatory Visit (HOSPITAL_COMMUNITY)
Admission: RE | Admit: 2023-02-12 | Discharge: 2023-02-12 | Disposition: A | Discharge status: Home / Self Care | Payer: Medicare Other | Source: Ambulatory Visit | Attending: Cardiovascular Disease | Admitting: Cardiovascular Disease

## 2023-02-12 ENCOUNTER — Encounter (HOSPITAL_COMMUNITY)
Admission: RE | Disposition: A | Discharge status: Home / Self Care | Payer: Self-pay | Source: Ambulatory Visit | Attending: Cardiovascular Disease

## 2023-02-12 ENCOUNTER — Ambulatory Visit (HOSPITAL_BASED_OUTPATIENT_CLINIC_OR_DEPARTMENT_OTHER): Payer: Medicare Other

## 2023-02-12 ENCOUNTER — Other Ambulatory Visit: Payer: Self-pay

## 2023-02-12 DIAGNOSIS — I25110 Atherosclerotic heart disease of native coronary artery with unstable angina pectoris: Secondary | ICD-10-CM | POA: Diagnosis not present

## 2023-02-12 DIAGNOSIS — I110 Hypertensive heart disease with heart failure: Secondary | ICD-10-CM | POA: Insufficient documentation

## 2023-02-12 DIAGNOSIS — I480 Paroxysmal atrial fibrillation: Secondary | ICD-10-CM | POA: Diagnosis not present

## 2023-02-12 DIAGNOSIS — E785 Hyperlipidemia, unspecified: Secondary | ICD-10-CM | POA: Insufficient documentation

## 2023-02-12 DIAGNOSIS — I2510 Atherosclerotic heart disease of native coronary artery without angina pectoris: Secondary | ICD-10-CM

## 2023-02-12 DIAGNOSIS — Z87891 Personal history of nicotine dependence: Secondary | ICD-10-CM | POA: Diagnosis not present

## 2023-02-12 DIAGNOSIS — I509 Heart failure, unspecified: Secondary | ICD-10-CM | POA: Insufficient documentation

## 2023-02-12 DIAGNOSIS — Z8673 Personal history of transient ischemic attack (TIA), and cerebral infarction without residual deficits: Secondary | ICD-10-CM | POA: Insufficient documentation

## 2023-02-12 DIAGNOSIS — Z7901 Long term (current) use of anticoagulants: Secondary | ICD-10-CM | POA: Insufficient documentation

## 2023-02-12 DIAGNOSIS — I200 Unstable angina: Secondary | ICD-10-CM | POA: Diagnosis present

## 2023-02-12 DIAGNOSIS — I428 Other cardiomyopathies: Secondary | ICD-10-CM | POA: Insufficient documentation

## 2023-02-12 DIAGNOSIS — R0609 Other forms of dyspnea: Secondary | ICD-10-CM

## 2023-02-12 HISTORY — PX: RIGHT/LEFT HEART CATH AND CORONARY ANGIOGRAPHY: CATH118266

## 2023-02-12 LAB — POCT I-STAT 7, (LYTES, BLD GAS, ICA,H+H)
Acid-base deficit: 2 mmol/L (ref 0.0–2.0)
Bicarbonate: 22.9 mmol/L (ref 20.0–28.0)
Calcium, Ion: 1.27 mmol/L (ref 1.15–1.40)
HCT: 37 % — ABNORMAL LOW (ref 39.0–52.0)
Hemoglobin: 12.6 g/dL — ABNORMAL LOW (ref 13.0–17.0)
O2 Saturation: 95 %
Potassium: 4.5 mmol/L (ref 3.5–5.1)
Sodium: 139 mmol/L (ref 135–145)
TCO2: 24 mmol/L (ref 22–32)
pCO2 arterial: 38.3 mmHg (ref 32–48)
pH, Arterial: 7.385 (ref 7.35–7.45)
pO2, Arterial: 77 mmHg — ABNORMAL LOW (ref 83–108)

## 2023-02-12 LAB — ECHOCARDIOGRAM COMPLETE
AR max vel: 2.85 cm2
AV Area VTI: 2.74 cm2
AV Area mean vel: 2.94 cm2
AV Mean grad: 5 mmHg
AV Peak grad: 8 mmHg
Ao pk vel: 1.41 m/s
Area-P 1/2: 4.39 cm2
Height: 72 in
S' Lateral: 5 cm
Weight: 3936 oz

## 2023-02-12 LAB — POCT I-STAT EG7
Acid-base deficit: 3 mmol/L — ABNORMAL HIGH (ref 0.0–2.0)
Bicarbonate: 22.1 mmol/L (ref 20.0–28.0)
Calcium, Ion: 1.06 mmol/L — ABNORMAL LOW (ref 1.15–1.40)
HCT: 34 % — ABNORMAL LOW (ref 39.0–52.0)
Hemoglobin: 11.6 g/dL — ABNORMAL LOW (ref 13.0–17.0)
O2 Saturation: 71 %
Potassium: 3.8 mmol/L (ref 3.5–5.1)
Sodium: 144 mmol/L (ref 135–145)
TCO2: 23 mmol/L (ref 22–32)
pCO2, Ven: 39.9 mmHg — ABNORMAL LOW (ref 44–60)
pH, Ven: 7.351 (ref 7.25–7.43)
pO2, Ven: 39 mmHg (ref 32–45)

## 2023-02-12 LAB — GLUCOSE, CAPILLARY: Glucose-Capillary: 139 mg/dL — ABNORMAL HIGH (ref 70–99)

## 2023-02-12 SURGERY — RIGHT/LEFT HEART CATH AND CORONARY ANGIOGRAPHY
Anesthesia: LOCAL

## 2023-02-12 MED ORDER — SODIUM CHLORIDE 0.9 % WEIGHT BASED INFUSION: 1.0000 mL/kg/h | INTRAVENOUS | Status: DC

## 2023-02-12 MED ORDER — VERAPAMIL HCL 2.5 MG/ML IV SOLN
INTRAVENOUS | Status: DC | PRN
  Administered 2023-02-12: 10 mL via INTRA_ARTERIAL

## 2023-02-12 MED ORDER — HEPARIN SODIUM (PORCINE) 1000 UNIT/ML IJ SOLN
INTRAMUSCULAR | Status: DC | PRN
  Administered 2023-02-12: 6000 [IU] via INTRAVENOUS

## 2023-02-12 MED ORDER — ACETAMINOPHEN 325 MG PO TABS: 650.0000 mg | ORAL_TABLET | ORAL | Status: DC | PRN

## 2023-02-12 MED ORDER — SODIUM CHLORIDE 0.9% FLUSH: 3.0000 mL | INTRAVENOUS | Status: DC | PRN

## 2023-02-12 MED ORDER — FENTANYL CITRATE (PF) 100 MCG/2ML IJ SOLN
INTRAMUSCULAR | Status: DC | PRN
  Administered 2023-02-12: 25 ug via INTRAVENOUS

## 2023-02-12 MED ORDER — LIDOCAINE HCL (PF) 1 % IJ SOLN
INTRAMUSCULAR | Status: AC
  Filled 2023-02-12: qty 30

## 2023-02-12 MED ORDER — SODIUM CHLORIDE 0.9 % IV SOLN: 250.0000 mL | INTRAVENOUS | Status: DC | PRN

## 2023-02-12 MED ORDER — MIDAZOLAM HCL 2 MG/2ML IJ SOLN
INTRAMUSCULAR | Status: DC | PRN
  Administered 2023-02-12: 1 mg via INTRAVENOUS

## 2023-02-12 MED ORDER — ONDANSETRON HCL 4 MG/2ML IJ SOLN: 4.0000 mg | Freq: Four times a day (QID) | INTRAMUSCULAR | Status: DC | PRN

## 2023-02-12 MED ORDER — HYDRALAZINE HCL 20 MG/ML IJ SOLN: 10.0000 mg | INTRAMUSCULAR | Status: DC | PRN

## 2023-02-12 MED ORDER — HEPARIN (PORCINE) IN NACL 1000-0.9 UT/500ML-% IV SOLN
INTRAVENOUS | Status: DC | PRN
  Administered 2023-02-12 (×2): 500 mL

## 2023-02-12 MED ORDER — HEPARIN SODIUM (PORCINE) 1000 UNIT/ML IJ SOLN
INTRAMUSCULAR | Status: AC
  Filled 2023-02-12: qty 10

## 2023-02-12 MED ORDER — IOHEXOL 350 MG/ML SOLN
INTRAVENOUS | Status: DC | PRN
  Administered 2023-02-12: 60 mL via INTRA_ARTERIAL

## 2023-02-12 MED ORDER — VERAPAMIL HCL 2.5 MG/ML IV SOLN
INTRAVENOUS | Status: AC
  Filled 2023-02-12: qty 2

## 2023-02-12 MED ORDER — MIDAZOLAM HCL 2 MG/2ML IJ SOLN
INTRAMUSCULAR | Status: AC
  Filled 2023-02-12: qty 2

## 2023-02-12 MED ORDER — SODIUM CHLORIDE 0.9% FLUSH: 3.0000 mL | Freq: Two times a day (BID) | INTRAVENOUS | Status: DC

## 2023-02-12 MED ORDER — PERFLUTREN LIPID MICROSPHERE
1.0000 mL | INTRAVENOUS | Status: DC | PRN
  Administered 2023-02-12: 2 mL via INTRAVENOUS

## 2023-02-12 MED ORDER — SODIUM CHLORIDE 0.9 % WEIGHT BASED INFUSION
3.0000 mL/kg/h | INTRAVENOUS | Status: AC
  Administered 2023-02-12: 3 mL/kg/h via INTRAVENOUS

## 2023-02-12 MED ORDER — ASPIRIN 81 MG PO CHEW: 81.0000 mg | CHEWABLE_TABLET | ORAL | Status: DC

## 2023-02-12 MED ORDER — LABETALOL HCL 5 MG/ML IV SOLN: 10.0000 mg | INTRAVENOUS | Status: DC | PRN

## 2023-02-12 MED ORDER — SODIUM CHLORIDE 0.9 % IV SOLN: INTRAVENOUS | Status: AC

## 2023-02-12 MED ORDER — LIDOCAINE HCL (PF) 1 % IJ SOLN
INTRAMUSCULAR | Status: DC | PRN
  Administered 2023-02-12: 4 mL via INTRADERMAL

## 2023-02-12 MED ORDER — FENTANYL CITRATE (PF) 100 MCG/2ML IJ SOLN
INTRAMUSCULAR | Status: AC
  Filled 2023-02-12: qty 2

## 2023-02-12 SURGICAL SUPPLY — 13 items
BAND CMPR LRG ZPHR (HEMOSTASIS) ×1
BAND ZEPHYR COMPRESS 30 LONG (HEMOSTASIS) IMPLANT
CATH 5FR JL3.5 JR4 ANG PIG MP (CATHETERS) IMPLANT
CATH BALLN WEDGE 5F 110CM (CATHETERS) IMPLANT
GLIDESHEATH SLEND SS 6F .021 (SHEATH) IMPLANT
GUIDEWIRE .025 260CM (WIRE) IMPLANT
GUIDEWIRE INQWIRE 1.5J.035X260 (WIRE) IMPLANT
INQWIRE 1.5J .035X260CM (WIRE) ×1
KIT HEART LEFT (KITS) ×1 IMPLANT
PACK CARDIAC CATHETERIZATION (CUSTOM PROCEDURE TRAY) ×1 IMPLANT
SHEATH GLIDE SLENDER 4/5FR (SHEATH) IMPLANT
TRANSDUCER W/STOPCOCK (MISCELLANEOUS) ×1 IMPLANT
TUBING CIL FLEX 10 FLL-RA (TUBING) ×1 IMPLANT

## 2023-02-12 NOTE — Interval H&P Note (Signed)
History and Physical Interval Note:  02/12/2023 7:42 AM  Bobby Bradley  has presented today for surgery, with the diagnosis of shortness of breath; Abnormal coronary PET.  The various methods of treatment have been discussed with the patient and family. After consideration of risks, benefits and other options for treatment, the patient has consented to  Procedure(s): RIGHT/LEFT HEART CATH AND CORONARY ANGIOGRAPHY (N/A) as a surgical intervention.  The patient's history has been reviewed, patient examined, no change in status, stable for surgery.  I have reviewed the patient's chart and labs.  Questions were answered to the patient's satisfaction.    Cath Lab Visit (complete for each Cath Lab visit)  Clinical Evaluation Leading to the Procedure:   ACS: No.  Non-ACS:    Anginal Classification: CCS III  Anti-ischemic medical therapy: Maximal Therapy (2 or more classes of medications)  Non-Invasive Test Results: High-risk stress test findings: cardiac mortality >3%/year  Prior CABG: No previous CABG        Verne Carrow

## 2023-02-12 NOTE — Discharge Instructions (Addendum)
Resume Eliquis tomorrow if no bleeding from right arm cath sites.   Drink plenty of fluids for 48 hours and keep wrist elevated at heart level for 24 hours  Radial Site Care   This sheet gives you information about how to care for yourself after your procedure. Your health care provider may also give you more specific instructions. If you have problems or questions, contact your health care provider. What can I expect after the procedure? After the procedure, it is common to have: Bruising and tenderness at the catheter insertion area. Follow these instructions at home: Medicines Take over-the-counter and prescription medicines only as told by your health care provider. Insertion site care Follow instructions from your health care provider about how to take care of your insertion site. Make sure you: Wash your hands with soap and water before you change your bandage (dressing). If soap and water are not available, use hand sanitizer. Remove your dressing as told by your health care provider. In 24 hours Check your insertion site every day for signs of infection. Check for: Redness, swelling, or pain. Fluid or blood. Pus or a bad smell. Warmth. Do not take baths, swim, or use a hot tub until your health care provider approves. You may shower 24-48 hours after the procedure, or as directed by your health care provider. Remove the dressing and gently wash the site with plain soap and water. Pat the area dry with a clean towel. Do not rub the site. That could cause bleeding. Do not apply powder or lotion to the site. Activity   For 24 hours after the procedure, or as directed by your health care provider: Do not flex or bend the affected arm. Do not push or pull heavy objects with the affected arm. Do not drive yourself home from the hospital or clinic. You may drive 24 hours after the procedure unless your health care provider tells you not to. Do not operate machinery or power  tools. Do not lift anything that is heavier than 10 lb (4.5 kg), or the limit that you are told, until your health care provider says that it is safe.  For 4 days Ask your health care provider when it is okay to: Return to work or school. Resume usual physical activities or sports. Resume sexual activity. General instructions If the catheter site starts to bleed, raise your arm and put firm pressure on the site. If the bleeding does not stop, get help right away. This is a medical emergency. If you went home on the same day as your procedure, a responsible adult should be with you for the first 24 hours after you arrive home. Keep all follow-up visits as told by your health care provider. This is important. Contact a health care provider if: You have a fever. You have redness, swelling, or yellow drainage around your insertion site. Get help right away if: You have unusual pain at the radial site. The catheter insertion area swells very fast. The insertion area is bleeding, and the bleeding does not stop when you hold steady pressure on the area. Your arm or hand becomes pale, cool, tingly, or numb. These symptoms may represent a serious problem that is an emergency. Do not wait to see if the symptoms will go away. Get medical help right away. Call your local emergency services (911 in the [place] Do not drive yourself to the hospital. Summary After the procedure, it is common to have bruising and tenderness at the site. Follow  instructions from your health care provider about how to take care of your radial site wound. Check the wound every day for signs of infection. Do not lift anything that is heavier than 10 lb (4.5 kg), or the limit that you are told, until your health care provider says that it is safe. This information is not intended to replace advice given to you by your health care provider. Make sure you discuss any questions you have with your health care provider. Document  Revised: 10/03/2017 Document Reviewed: 10/03/2017 Elsevier Patient Education  2020 [employer]   Brachial Site Care   This sheet gives you information about how to care for yourself after your procedure. Your health care provider may also give you more specific instructions. If you have problems or questions, contact your health care provider. What can I expect after the procedure? After the procedure, it is common to have: Bruising and tenderness at the catheter insertion area. Follow these instructions at home:  Insertion site care Follow instructions from your health care provider about how to take care of your insertion site. Make sure you: Wash your hands with soap and water before you change your bandage (dressing). If soap and water are not available, use hand sanitizer. Remove your dressing as told by your health care provider. In 24 hours Check your insertion site every day for signs of infection. Check for: Redness, swelling, or pain. Pus or a bad smell. Warmth. You may shower 24-48 hours after the procedure. Do not apply powder or lotion to the site.  Activity For 24 hours after the procedure, or as directed by your health care provider: Do not push or pull heavy objects with the affected arm. Do not drive yourself home from the hospital or clinic. You may drive 24 hours after the procedure unless your health care provider tells you not to. Do not lift anything that is heavier than 10 lb (4.5 kg), or the limit that you are told, until your health care provider says that it is safe.  For 24 hours

## 2023-02-13 ENCOUNTER — Encounter (HOSPITAL_COMMUNITY): Payer: Self-pay | Admitting: Cardiovascular Disease

## 2023-02-14 NOTE — H&P (View-Only) (Signed)
[address]       [place] [address]             [phone]        Bobby Bradley [facility] Medical Record #[id number] Date of Birth: [birth year]  Referring: Kathleene Hazel* Primary Care: Patsy Lager Gwenlyn Found, MD Primary Cardiologist:Brian Jens Som, MD  Chief Complaint:    Chief Complaint  Patient presents with   Coronary Artery Disease    Cath 6/3, ECHO 6/3    History of Present Illness:     77 year old male presents for surgical evaluation of three-vessel coronary artery disease.  He has a history of permanent pacemaker placement, atrial fibrillation, and hypertension.  He also has a history of diabetes mellitus, and has had TIAs in the past.  Over the last 2 has had progressive exertional dyspnea.  He is unable to walk short distances before experiencing symptoms.  He denies any chest pain or lower extremity swelling.  He denies any palpitations.   Past Medical and Surgical History: Previous Chest Surgery: No Previous Chest Radiation: No Diabetes Mellitus: Yes.  HbA1C 6.9 Creatinine: 1  Past Medical History:  Diagnosis Date   A-fib (HCC)    CHF (congestive heart failure) (HCC)    Colon polyps    Diabetes mellitus without complication (HCC)    Essential hypertension    Hyperlipidemia    Non-ischemic cardiomyopathy (HCC)    Presence of combination internal cardiac defibrillator (ICD) and pacemaker    Prostate cancer (HCC)    Stroke (HCC)    TIA- 1994   TIA (transient ischemic attack)     Past Surgical History:  Procedure Laterality Date   BACK SURGERY     COLONOSCOPY     about 10 years. [place] in [place]    ICD IMPLANT  2012   PROSTATECTOMY     RIGHT/LEFT HEART CATH AND CORONARY ANGIOGRAPHY N/A 02/12/2023   Procedure: RIGHT/LEFT HEART CATH AND CORONARY ANGIOGRAPHY;  Surgeon: Kathleene Hazel, MD;  Location: [facility];  Service: Cardiovascular;  Laterality: N/A;    Social  History: Support: He presents today with his wife  Social History   Tobacco Use  Smoking Status Former   Types: Cigarettes   Quit date: 09/12/1983   Years since quitting: 39.4   Passive exposure: Never  Smokeless Tobacco Never    Social History   Substance and Sexual Activity  Alcohol Use Yes   Comment: very rare     Allergies  Allergen Reactions   Lisinopril     Extreme diarrhea    Medications:  Anti-Coagulation: Eliquis  Current Outpatient Medications  Medication Sig Dispense Refill   amLODipine (NORVASC) 10 MG tablet TAKE 1 TABLET(10 MG) BY MOUTH DAILY 90 tablet 3   apixaban (ELIQUIS) 5 MG TABS tablet TAKE 1 TABLET(5 MG) BY MOUTH TWICE DAILY 180 tablet 1   aspirin EC 81 MG tablet Take 81 mg by mouth daily. Swallow whole.     carvedilol (COREG) 25 MG tablet TAKE 1 TABLET(25 MG) BY MOUTH TWICE DAILY 180 tablet 1   cetirizine (ZYRTEC) 10 MG tablet Take 10 mg by mouth daily.     ferrous sulfate 325 (65 FE) MG tablet Take 1 tablet (325 mg total) by mouth daily with breakfast. 90 tablet 3   losartan (COZAAR) 100 MG tablet TAKE 1 TABLET(100 MG) BY MOUTH DAILY 90 tablet 3   metFORMIN (GLUCOPHAGE) 500 MG tablet TAKE 1 TABLET(500 MG) BY  MOUTH TWICE DAILY WITH A MEAL 180 tablet 3   Multiple Vitamin (MULTI VITAMIN DAILY PO) Take 1 tablet by mouth daily.     rosuvastatin (CRESTOR) 40 MG tablet Take 1 tablet (40 mg total) by mouth daily. 90 tablet 3   spironolactone (ALDACTONE) 25 MG tablet Take 1 tablet (25 mg total) by mouth daily. 90 tablet 3   Current Facility-Administered Medications  Medication Dose Route Frequency Provider Last Rate Last Admin   0.9 %  sodium chloride infusion  500 mL Intravenous Once Armbruster, Willaim Rayas, MD       sodium chloride flush (NS) 0.9 % injection 3 mL  3 mL Intravenous Q12H Jens Som Madolyn Frieze, MD        (Not in a hospital admission)   Family History  Problem Relation Age of Onset   Lung cancer Mother    Uterine cancer Mother    Lung  cancer Father    Atrial fibrillation Sister    Lung cancer Brother    Colon cancer Neg Hx    Esophageal cancer Neg Hx    Pancreatic cancer Neg Hx    Liver disease Neg Hx    Stomach cancer Neg Hx    Rectal cancer Neg Hx      Review of Systems:   Review of Systems  Constitutional:  Positive for malaise/fatigue. Negative for weight loss.  Respiratory:  Positive for shortness of breath.   Cardiovascular:  Negative for chest pain.      Physical Exam: BP 126/70   Pulse 61   Resp 18   Ht 6' (1.829 m)   Wt 246 lb (111.6 kg)   SpO2 92% Comment: RA  BMI 33.36 kg/m  Physical Exam Constitutional:      General: He is not in acute distress.    Appearance: He is not ill-appearing.  HENT:     Head: Normocephalic and atraumatic.  Cardiovascular:     Rate and Rhythm: Normal rate.  Pulmonary:     Effort: Pulmonary effort is normal. No respiratory distress.  Abdominal:     General: There is no distension.  Musculoskeletal:        General: Normal range of motion.     Cervical back: Normal range of motion.  Skin:    General: Skin is warm and dry.  Neurological:     General: No focal deficit present.     Mental Status: He is alert and oriented to person, place, and time.       Diagnostic Studies & Laboratory data:    Left Heart Catherization:  Intervention  Echo:  1. Left ventricular ejection fraction, by estimation, is 35 to 40%. The  left ventricle has moderately decreased function. The left ventricle  demonstrates regional wall motion abnormalities (see scoring  diagram/findings for description). There is mild  asymmetric left ventricular hypertrophy of the basal-septal segment. Left  ventricular diastolic parameters are consistent with Grade I diastolic  dysfunction (impaired relaxation).   2. Right ventricular systolic function is normal. The right ventricular  size is normal.   3. The mitral valve is normal in structure. Trivial mitral valve  regurgitation. No  evidence of mitral stenosis.   4. The aortic valve is tricuspid. There is mild calcification of the  aortic valve. Aortic valve regurgitation is not visualized. Aortic valve  sclerosis/calcification is present, without any evidence of aortic  stenosis.   5. Limited study due to poor sound wave transmission. There seems to be  hypokinesis of the inferior,  inferolateral and lateral walls.   EKG: Sinus, 1st degree, LBBB  I have independently reviewed the above radiologic studies and discussed with the patient   Recent Lab Findings: Lab Results  Component Value Date   WBC 7.7 02/08/2023   HGB 12.6 (L) 02/12/2023   HCT 37.0 (L) 02/12/2023   PLT 212 02/08/2023   GLUCOSE 168 (H) 02/08/2023   CHOL 124 11/13/2022   TRIG 321.0 (H) 11/13/2022   HDL 32.30 (L) 11/13/2022   LDLDIRECT 55.0 11/13/2022   LDLCALC 33 01/27/2021   ALT 19 11/13/2022   AST 15 11/13/2022   NA 139 02/12/2023   K 4.5 02/12/2023   CL 102 02/08/2023   CREATININE 1.00 02/08/2023   BUN 14 02/08/2023   CO2 25 02/08/2023   TSH 2.110 08/31/2022   HGBA1C 6.9 (H) 11/13/2022      Assessment / Plan:   77 year old male with three-vessel coronary artery disease, and history of paroxysmal atrial fibrillation.  Most recent EKG shows that he is in sinus rhythm.  His echocardiogram shows preserved biventricular function.  We reviewed his left heart catheterization he has significant disease, but is good targets in his PDA, 2 targets on his lateral wall, and his LAD.  In addition to surgical revascularization, I will also performed an atrial clip.  The risks and benefits have been discussed.  He is agreeable to proceed. Hx PPM PDA, OM2, 4, diag, LAD     I  spent 40 minutes counseling the patient face to face.   Corliss Skains 02/16/2023 3:30 PM

## 2023-02-14 NOTE — Progress Notes (Signed)
[address]       [place] [address]             [phone]        Adison Larowe [facility] Medical Record #[id number] Date of Birth: [birth year]  Referring: Kathleene Hazel* Primary Care: Patsy Lager Gwenlyn Found, MD Primary Cardiologist:Brian Jens Som, MD  Chief Complaint:    Chief Complaint  Patient presents with   Coronary Artery Disease    Cath 6/3, ECHO 6/3    History of Present Illness:     77 year old male presents for surgical evaluation of three-vessel coronary artery disease.  He has a history of permanent pacemaker placement, atrial fibrillation, and hypertension.  He also has a history of diabetes mellitus, and has had TIAs in the past.  Over the last 2 has had progressive exertional dyspnea.  He is unable to walk short distances before experiencing symptoms.  He denies any chest pain or lower extremity swelling.  He denies any palpitations.   Past Medical and Surgical History: Previous Chest Surgery: No Previous Chest Radiation: No Diabetes Mellitus: Yes.  HbA1C 6.9 Creatinine: 1  Past Medical History:  Diagnosis Date   A-fib (HCC)    CHF (congestive heart failure) (HCC)    Colon polyps    Diabetes mellitus without complication (HCC)    Essential hypertension    Hyperlipidemia    Non-ischemic cardiomyopathy (HCC)    Presence of combination internal cardiac defibrillator (ICD) and pacemaker    Prostate cancer (HCC)    Stroke (HCC)    TIA- 1994   TIA (transient ischemic attack)     Past Surgical History:  Procedure Laterality Date   BACK SURGERY     COLONOSCOPY     about 10 years. Done in [place]    ICD IMPLANT  2012   PROSTATECTOMY     RIGHT/LEFT HEART CATH AND CORONARY ANGIOGRAPHY N/A 02/12/2023   Procedure: RIGHT/LEFT HEART CATH AND CORONARY ANGIOGRAPHY;  Surgeon: Kathleene Hazel, MD;  Location: [facility];  Service: Cardiovascular;  Laterality: N/A;    Social  History: Support: He presents today with his wife  Social History   Tobacco Use  Smoking Status Former   Types: Cigarettes   Quit date: 09/12/1983   Years since quitting: 39.4   Passive exposure: Never  Smokeless Tobacco Never    Social History   Substance and Sexual Activity  Alcohol Use Yes   Comment: very rare     Allergies  Allergen Reactions   Lisinopril     Extreme diarrhea    Medications:  Anti-Coagulation: Eliquis  Current Outpatient Medications  Medication Sig Dispense Refill   amLODipine (NORVASC) 10 MG tablet TAKE 1 TABLET(10 MG) BY MOUTH DAILY 90 tablet 3   apixaban (ELIQUIS) 5 MG TABS tablet TAKE 1 TABLET(5 MG) BY MOUTH TWICE DAILY 180 tablet 1   aspirin EC 81 MG tablet Take 81 mg by mouth daily. Swallow whole.     carvedilol (COREG) 25 MG tablet TAKE 1 TABLET(25 MG) BY MOUTH TWICE DAILY 180 tablet 1   cetirizine (ZYRTEC) 10 MG tablet Take 10 mg by mouth daily.     ferrous sulfate 325 (65 FE) MG tablet Take 1 tablet (325 mg total) by mouth daily with breakfast. 90 tablet 3   losartan (COZAAR) 100 MG tablet TAKE 1 TABLET(100 MG) BY MOUTH DAILY 90 tablet 3   metFORMIN (GLUCOPHAGE) 500 MG tablet TAKE 1 TABLET(500 MG) BY  MOUTH TWICE DAILY WITH A MEAL 180 tablet 3   Multiple Vitamin (MULTI VITAMIN DAILY PO) Take 1 tablet by mouth daily.     rosuvastatin (CRESTOR) 40 MG tablet Take 1 tablet (40 mg total) by mouth daily. 90 tablet 3   spironolactone (ALDACTONE) 25 MG tablet Take 1 tablet (25 mg total) by mouth daily. 90 tablet 3   Current Facility-Administered Medications  Medication Dose Route Frequency Provider Last Rate Last Admin   0.9 %  sodium chloride infusion  500 mL Intravenous Once Armbruster, Willaim Rayas, MD       sodium chloride flush (NS) 0.9 % injection 3 mL  3 mL Intravenous Q12H Jens Som Madolyn Frieze, MD        (Not in a hospital admission)   Family History  Problem Relation Age of Onset   Lung cancer Mother    Uterine cancer Mother    Lung  cancer Father    Atrial fibrillation Sister    Lung cancer Brother    Colon cancer Neg Hx    Esophageal cancer Neg Hx    Pancreatic cancer Neg Hx    Liver disease Neg Hx    Stomach cancer Neg Hx    Rectal cancer Neg Hx      Review of Systems:   Review of Systems  Constitutional:  Positive for malaise/fatigue. Negative for weight loss.  Respiratory:  Positive for shortness of breath.   Cardiovascular:  Negative for chest pain.      Physical Exam: BP 126/70   Pulse 61   Resp 18   Ht 6' (1.829 m)   Wt 246 lb (111.6 kg)   SpO2 92% Comment: RA  BMI 33.36 kg/m  Physical Exam Constitutional:      General: He is not in acute distress.    Appearance: He is not ill-appearing.  HENT:     Head: Normocephalic and atraumatic.  Cardiovascular:     Rate and Rhythm: Normal rate.  Pulmonary:     Effort: Pulmonary effort is normal. No respiratory distress.  Abdominal:     General: There is no distension.  Musculoskeletal:        General: Normal range of motion.     Cervical back: Normal range of motion.  Skin:    General: Skin is warm and dry.  Neurological:     General: No focal deficit present.     Mental Status: He is alert and oriented to person, place, and time.       Diagnostic Studies & Laboratory data:    Left Heart Catherization:  Intervention  Echo:  1. Left ventricular ejection fraction, by estimation, is 35 to 40%. The  left ventricle has moderately decreased function. The left ventricle  demonstrates regional wall motion abnormalities (see scoring  diagram/findings for description). There is mild  asymmetric left ventricular hypertrophy of the basal-septal segment. Left  ventricular diastolic parameters are consistent with Grade I diastolic  dysfunction (impaired relaxation).   2. Right ventricular systolic function is normal. The right ventricular  size is normal.   3. The mitral valve is normal in structure. Trivial mitral valve  regurgitation. No  evidence of mitral stenosis.   4. The aortic valve is tricuspid. There is mild calcification of the  aortic valve. Aortic valve regurgitation is not visualized. Aortic valve  sclerosis/calcification is present, without any evidence of aortic  stenosis.   5. Limited study due to poor sound wave transmission. There seems to be  hypokinesis of the inferior,  inferolateral and lateral walls.   EKG: Sinus, 1st degree, LBBB  I have independently reviewed the above radiologic studies and discussed with the patient   Recent Lab Findings: Lab Results  Component Value Date   WBC 7.7 02/08/2023   HGB 12.6 (L) 02/12/2023   HCT 37.0 (L) 02/12/2023   PLT 212 02/08/2023   GLUCOSE 168 (H) 02/08/2023   CHOL 124 11/13/2022   TRIG 321.0 (H) 11/13/2022   HDL 32.30 (L) 11/13/2022   LDLDIRECT 55.0 11/13/2022   LDLCALC 33 01/27/2021   ALT 19 11/13/2022   AST 15 11/13/2022   NA 139 02/12/2023   K 4.5 02/12/2023   CL 102 02/08/2023   CREATININE 1.00 02/08/2023   BUN 14 02/08/2023   CO2 25 02/08/2023   TSH 2.110 08/31/2022   HGBA1C 6.9 (H) 11/13/2022      Assessment / Plan:   77 year old male with three-vessel coronary artery disease, and history of paroxysmal atrial fibrillation.  Most recent EKG shows that he is in sinus rhythm.  His echocardiogram shows preserved biventricular function.  We reviewed his left heart catheterization he has significant disease, but is good targets in his PDA, 2 targets on his lateral wall, and his LAD.  In addition to surgical revascularization, I will also performed an atrial clip.  The risks and benefits have been discussed.  He is agreeable to proceed. Hx PPM PDA, OM2, 4, diag, LAD     I  spent 40 minutes counseling the patient face to face.   Corliss Skains 02/16/2023 3:30 PM

## 2023-02-16 ENCOUNTER — Other Ambulatory Visit: Payer: Self-pay | Admitting: *Deleted

## 2023-02-16 ENCOUNTER — Institutional Professional Consult (permissible substitution) (INDEPENDENT_AMBULATORY_CARE_PROVIDER_SITE_OTHER): Payer: Medicare Other | Admitting: Thoracic Surgery (Cardiothoracic Vascular Surgery)

## 2023-02-16 ENCOUNTER — Encounter: Payer: Self-pay | Admitting: *Deleted

## 2023-02-16 VITALS — BP 126/70 | HR 61 | Resp 18 | Ht 72.0 in | Wt 246.0 lb

## 2023-02-16 DIAGNOSIS — I2510 Atherosclerotic heart disease of native coronary artery without angina pectoris: Secondary | ICD-10-CM

## 2023-02-16 DIAGNOSIS — R9439 Abnormal result of other cardiovascular function study: Secondary | ICD-10-CM

## 2023-02-16 DIAGNOSIS — R7989 Other specified abnormal findings of blood chemistry: Secondary | ICD-10-CM

## 2023-02-16 DIAGNOSIS — Z5181 Encounter for therapeutic drug level monitoring: Secondary | ICD-10-CM

## 2023-02-21 NOTE — Pre-Procedure Instructions (Signed)
Surgical Instructions    Your procedure is scheduled on February 26, 2023.  Report to [facility] Main Entrance "A" at 5:30 A.M., then check in with the Admitting office.  Call this number if you have problems the morning of surgery:  [phone]   If you have any questions prior to your surgery date call [phone]: Open Monday-Friday 8am-4pm If you experience any cold or flu symptoms such as cough, fever, chills, shortness of breath, etc. between now and your scheduled surgery, please notify us at the above number     Remember:  Do not eat or drink after midnight the night before your surgery    Take these medicines the morning of surgery with A SIP OF WATER:   Amlodipine (NORVASC) Carvedilol (COREG) Cetirizine (ZYRTEC)  Per your Doctor, **STOP ELIQUIS 3 DAYS PRIOR TO SURGERY**  As of today, STOP taking any Aspirin (unless otherwise instructed by your surgeon) Aleve, Naproxen, Ibuprofen, Motrin, Advil, Goody's, BC's, all herbal medications, fish oil, and all vitamins.   WHAT DO I DO ABOUT MY DIABETES MEDICATION?  Per your Doctor, **STOP METFORMIN 2 DAYS PRIOR TO SURGERY**   HOW TO MANAGE YOUR DIABETES BEFORE AND AFTER SURGERY  Why is it important to control my blood sugar before and after surgery? Improving blood sugar levels before and after surgery helps healing and can limit problems. A way of improving blood sugar control is eating a healthy diet by:  Eating less sugar and carbohydrates  Increasing activity/exercise  Talking with your doctor about reaching your blood sugar goals High blood sugars (greater than 180 mg/dL) can raise your risk of infections and slow your recovery, so you will need to focus on controlling your diabetes during the weeks before surgery. Make sure that the doctor who takes care of your diabetes knows about your planned surgery including the date and location.  How do I manage my blood sugar before surgery? Check your blood sugar at least 4  times a day, starting 2 days before surgery, to make sure that the level is not too high or low.  Check your blood sugar the morning of your surgery when you wake up and every 2 hours until you get to the Short Stay unit.  If your blood sugar is less than 70 mg/dL, you will need to treat for low blood sugar: Do not take insulin. Treat a low blood sugar (less than 70 mg/dL) with  cup of clear juice (cranberry or apple), 4 glucose tablets, OR glucose gel. Recheck blood sugar in 15 minutes after treatment (to make sure it is greater than 70 mg/dL). If your blood sugar is not greater than 70 mg/dL on recheck, call [phone] for further instructions. Report your blood sugar to the short stay nurse when you get to Short Stay.  If you are admitted to the hospital after surgery: Your blood sugar will be checked by the staff and you will probably be given insulin after surgery (instead of oral diabetes medicines) to make sure you have good blood sugar levels. The goal for blood sugar control after surgery is 80-180 mg/dL.        Do not wear jewelry  Do not wear lotions, powders, perfumes/cologne or deodorant. Do not shave 48 hours prior to surgery.  Men may shave face and neck. Do not bring valuables to the hospital.  [facility] is not responsible for any belongings or valuables.    Do NOT Smoke (Tobacco/Vaping)  24 hours prior to your procedure  If you use a CPAP at night, you may bring your mask for your overnight stay.   Contacts, glasses, hearing aids, dentures or partials may not be worn into surgery, please bring cases for these belongings   For patients admitted to the hospital, discharge time will be determined by your treatment team.   Patients discharged the day of surgery will not be allowed to drive home, and someone needs to stay with them for 24 hours.   SURGICAL WAITING ROOM VISITATION Patients having surgery or a procedure may have no more than 2 support people in the  waiting area - these visitors may rotate.   Children under the age of 6 must have an adult with them who is not the patient. If the patient needs to stay at the hospital during part of their recovery, the visitor guidelines for inpatient rooms apply. Pre-op nurse will coordinate an appropriate time for 1 support person to accompany patient in pre-op.  This support person may not rotate.   Please refer to [email or web address] for the visitor guidelines for Inpatients (after your surgery is over and you are in a regular room).    Special instructions:    Oral Hygiene is also important to reduce your risk of infection.  Remember - BRUSH YOUR TEETH THE MORNING OF SURGERY WITH YOUR REGULAR TOOTHPASTE   [facility]- Preparing For Surgery  Before surgery, you can play an important role. Because skin is not sterile, your skin needs to be as free of germs as possible. You can reduce the number of germs on your skin by washing with CHG (chlorahexidine gluconate) Soap before surgery.  CHG is an antiseptic cleaner which kills germs and bonds with the skin to continue killing germs even after washing.     Please do not use if you have an allergy to CHG or antibacterial soaps. If your skin becomes reddened/irritated stop using the CHG.  Do not shave (including legs and underarms) for at least 48 hours prior to first CHG shower. It is OK to shave your face.  Please follow these instructions carefully.     Shower the NIGHT BEFORE SURGERY and the MORNING OF SURGERY with CHG Soap.   If you chose to wash your hair, wash your hair first as usual with your normal shampoo. After you shampoo, rinse your hair and body thoroughly to remove the shampoo.  Then [employer] and genitals (private parts) with your normal soap and rinse thoroughly to remove soap.  After that Use CHG Soap as you would any other liquid soap. You can apply CHG directly to the skin and wash  gently with a scrungie or a clean washcloth.   Apply the CHG Soap to your body ONLY FROM THE NECK DOWN.  Do not use on open wounds or open sores. Avoid contact with your eyes, ears, mouth and genitals (private parts). Wash Face and genitals (private parts)  with your normal soap.   Wash thoroughly, paying special attention to the area where your surgery will be performed.  Thoroughly rinse your body with warm water from the neck down.  DO NOT shower/wash with your normal soap after using and rinsing off the CHG Soap.  Pat yourself dry with a CLEAN TOWEL.  Wear CLEAN PAJAMAS to bed the night before surgery  Place CLEAN SHEETS on your bed the night before your surgery  DO NOT SLEEP WITH PETS.   Day of Surgery:  Take a shower with CHG soap. Wear Clean/Comfortable clothing the  morning of surgery Do not apply any deodorants/lotions.   Remember to brush your teeth WITH YOUR REGULAR TOOTHPASTE.    If you received a COVID test during your pre-op visit, it is requested that you wear a mask when out in public, stay away from anyone that may not be feeling well, and notify your surgeon if you develop symptoms. If you have been in contact with anyone that has tested positive in the last 10 days, please notify your surgeon.    Please read over the following fact sheets that you were given.

## 2023-02-22 ENCOUNTER — Other Ambulatory Visit: Payer: Self-pay

## 2023-02-22 ENCOUNTER — Encounter (HOSPITAL_COMMUNITY): Payer: Self-pay

## 2023-02-22 ENCOUNTER — Encounter (HOSPITAL_COMMUNITY)
Admission: RE | Admit: 2023-02-22 | Discharge: 2023-02-22 | Disposition: A | Discharge status: Home / Self Care | Payer: Medicare Other | Source: Ambulatory Visit | Attending: Thoracic Surgery (Cardiothoracic Vascular Surgery) | Admitting: Thoracic Surgery (Cardiothoracic Vascular Surgery)

## 2023-02-22 ENCOUNTER — Ambulatory Visit (HOSPITAL_COMMUNITY)
Admission: RE | Admit: 2023-02-22 | Discharge: 2023-02-22 | Disposition: A | Discharge status: Home / Self Care | Payer: Medicare Other | Source: Ambulatory Visit | Attending: Thoracic Surgery (Cardiothoracic Vascular Surgery) | Admitting: Thoracic Surgery (Cardiothoracic Vascular Surgery)

## 2023-02-22 VITALS — BP 130/66 | HR 63 | Temp 98.5°F | Resp 19 | Ht 72.0 in | Wt 241.5 lb

## 2023-02-22 DIAGNOSIS — Z951 Presence of aortocoronary bypass graft: Secondary | ICD-10-CM | POA: Insufficient documentation

## 2023-02-22 DIAGNOSIS — R7989 Other specified abnormal findings of blood chemistry: Secondary | ICD-10-CM | POA: Diagnosis not present

## 2023-02-22 DIAGNOSIS — Z01818 Encounter for other preprocedural examination: Secondary | ICD-10-CM

## 2023-02-22 DIAGNOSIS — I10 Essential (primary) hypertension: Secondary | ICD-10-CM | POA: Insufficient documentation

## 2023-02-22 DIAGNOSIS — Z5181 Encounter for therapeutic drug level monitoring: Secondary | ICD-10-CM | POA: Diagnosis not present

## 2023-02-22 DIAGNOSIS — I2510 Atherosclerotic heart disease of native coronary artery without angina pectoris: Secondary | ICD-10-CM

## 2023-02-22 DIAGNOSIS — E785 Hyperlipidemia, unspecified: Secondary | ICD-10-CM | POA: Insufficient documentation

## 2023-02-22 DIAGNOSIS — Z1152 Encounter for screening for COVID-19: Secondary | ICD-10-CM | POA: Insufficient documentation

## 2023-02-22 DIAGNOSIS — Z8673 Personal history of transient ischemic attack (TIA), and cerebral infarction without residual deficits: Secondary | ICD-10-CM | POA: Insufficient documentation

## 2023-02-22 DIAGNOSIS — E119 Type 2 diabetes mellitus without complications: Secondary | ICD-10-CM | POA: Diagnosis not present

## 2023-02-22 HISTORY — DX: Presence of automatic (implantable) cardiac defibrillator: Z95.810

## 2023-02-22 HISTORY — DX: Presence of cardiac pacemaker: Z95.0

## 2023-02-22 HISTORY — DX: Cardiac arrhythmia, unspecified: I49.9

## 2023-02-22 LAB — URINALYSIS, ROUTINE W REFLEX MICROSCOPIC
Bilirubin Urine: NEGATIVE
Glucose, UA: NEGATIVE mg/dL
Hgb urine dipstick: NEGATIVE
Ketones, ur: NEGATIVE mg/dL
Nitrite: NEGATIVE
Protein, ur: NEGATIVE mg/dL
Specific Gravity, Urine: 1.017 (ref 1.005–1.030)
WBC, UA: >50 WBC/hpf (ref 0–5)
pH: 6 (ref 5.0–8.0)

## 2023-02-22 LAB — SURGICAL PCR SCREEN
MRSA, PCR: NEGATIVE
Staphylococcus aureus: NEGATIVE

## 2023-02-22 LAB — COMPREHENSIVE METABOLIC PANEL
ALT: 25 U/L (ref 0–44)
AST: 21 U/L (ref 15–41)
Albumin: 3.9 g/dL (ref 3.5–5.0)
Alkaline Phosphatase: 79 U/L (ref 38–126)
Anion gap: 13 (ref 5–15)
BUN: 13 mg/dL (ref 8–23)
CO2: 20 mmol/L — ABNORMAL LOW (ref 22–32)
Calcium: 9.1 mg/dL (ref 8.9–10.3)
Chloride: 104 mmol/L (ref 98–111)
Creatinine, Ser: 1.03 mg/dL (ref 0.61–1.24)
GFR, Estimated: >60 mL/min (ref >60)
Glucose, Bld: 136 mg/dL — ABNORMAL HIGH (ref 70–99)
Potassium: 4.3 mmol/L (ref 3.5–5.1)
Sodium: 137 mmol/L (ref 135–145)
Total Bilirubin: 0.8 mg/dL (ref 0.3–1.2)
Total Protein: 7.2 g/dL (ref 6.5–8.1)

## 2023-02-22 LAB — CBC
HCT: 42.8 % (ref 39.0–52.0)
Hemoglobin: 14.6 g/dL (ref 13.0–17.0)
MCH: 30.1 pg (ref 26.0–34.0)
MCHC: 34.1 g/dL (ref 30.0–36.0)
MCV: 88.2 fL (ref 80.0–100.0)
Platelets: 235 10*3/uL (ref 150–400)
RBC: 4.85 MIL/uL (ref 4.22–5.81)
RDW: 12.8 % (ref 11.5–15.5)
WBC: 9.2 10*3/uL (ref 4.0–10.5)
nRBC: 0 % (ref 0.0–0.2)

## 2023-02-22 LAB — SARS CORONAVIRUS 2 (TAT 6-24 HRS): SARS Coronavirus 2: NEGATIVE

## 2023-02-22 LAB — TYPE AND SCREEN
ABO/RH(D): O POS
Antibody Screen: NEGATIVE

## 2023-02-22 LAB — APTT: aPTT: 34 seconds (ref 24–36)

## 2023-02-22 LAB — HEMOGLOBIN A1C
Hgb A1c MFr Bld: 6.5 % — ABNORMAL HIGH (ref 4.8–5.6)
Mean Plasma Glucose: 139.85 mg/dL

## 2023-02-22 LAB — PROTIME-INR
INR: 1.3 — ABNORMAL HIGH (ref 0.8–1.2)
Prothrombin Time: 16.3 seconds — ABNORMAL HIGH (ref 11.4–15.2)

## 2023-02-22 LAB — GLUCOSE, CAPILLARY: Glucose-Capillary: 133 mg/dL — ABNORMAL HIGH (ref 70–99)

## 2023-02-22 LAB — VAS US DOPPLER PRE CABG
Left ABI: 1.1
Right ABI: 1.2

## 2023-02-22 NOTE — Progress Notes (Signed)
Secure chat sent to Darius Bump with results of pt's UA. Many bacteria and moderate leukocytes present. Chart forwarded to anesthesia for review.

## 2023-02-22 NOTE — Progress Notes (Addendum)
PCP - Shanda Bumps Copland,MD Cardiologist - Ripley Fraise  PPM/ICD - [employer] ICD pacemaker Device Orders - requested from [facility] Rep Notified - Joey [phone]- will call him back when orders received.  Chest x-ray - 02/22/23 EKG - 02/08/23 Stress Test -01/31/23  ECHO - 02/12/23 Cardiac Cath - 02/12/23  Sleep Study - 01/07/23 CPAP - CPAP on order  Fasting Blood Sugar - pt said he does not have a meter and he never checks his blood sugar. He said his physician said that his blood sugar is well controlled and he doesn't need to check it.  Checks Blood Sugar _____ times a day  Last dose of GLP1 agonist- na  GLP1 instructions: na  Blood Thinner Instructions:pt states LD of Eliquis is 6/13 per Dr. Lucilla Lame instructions Aspirin Instructions:pt states last dose of Aspirin will be 6/16 per Dr. Cliffton Asters.   ERAS Protcol -no PRE-SURGERY Ensure or G2-   COVID TEST- 02/22/23   Anesthesia review: yes-hx afib,CHF,TIA  Patient denies shortness of breath, fever, cough and chest pain at PAT appointment   All instructions explained to the patient, with a verbal understanding of the material. Patient agrees to go over the instructions while at home for a better understanding. Patient also instructed to wear a mask in public after being tested for COVID-19. The opportunity to ask questions was provided.

## 2023-02-23 ENCOUNTER — Encounter: Payer: Self-pay | Admitting: Cardiology

## 2023-02-23 ENCOUNTER — Encounter (HOSPITAL_COMMUNITY): Payer: Self-pay | Admitting: Thoracic Surgery (Cardiothoracic Vascular Surgery)

## 2023-02-23 ENCOUNTER — Ambulatory Visit (INDEPENDENT_AMBULATORY_CARE_PROVIDER_SITE_OTHER): Payer: Medicare Other | Admitting: Cardiology

## 2023-02-23 ENCOUNTER — Other Ambulatory Visit: Payer: Self-pay | Admitting: *Deleted

## 2023-02-23 VITALS — BP 130/72 | HR 63 | Ht 72.0 in | Wt 242.0 lb

## 2023-02-23 DIAGNOSIS — E785 Hyperlipidemia, unspecified: Secondary | ICD-10-CM | POA: Diagnosis not present

## 2023-02-23 DIAGNOSIS — J9811 Atelectasis: Secondary | ICD-10-CM | POA: Diagnosis not present

## 2023-02-23 DIAGNOSIS — Z7901 Long term (current) use of anticoagulants: Secondary | ICD-10-CM | POA: Diagnosis not present

## 2023-02-23 DIAGNOSIS — Z95810 Presence of automatic (implantable) cardiac defibrillator: Secondary | ICD-10-CM

## 2023-02-23 DIAGNOSIS — Z8673 Personal history of transient ischemic attack (TIA), and cerebral infarction without residual deficits: Secondary | ICD-10-CM | POA: Diagnosis not present

## 2023-02-23 DIAGNOSIS — Z8546 Personal history of malignant neoplasm of prostate: Secondary | ICD-10-CM | POA: Diagnosis not present

## 2023-02-23 DIAGNOSIS — R079 Chest pain, unspecified: Secondary | ICD-10-CM | POA: Diagnosis not present

## 2023-02-23 DIAGNOSIS — I428 Other cardiomyopathies: Secondary | ICD-10-CM | POA: Diagnosis not present

## 2023-02-23 DIAGNOSIS — I25110 Atherosclerotic heart disease of native coronary artery with unstable angina pectoris: Secondary | ICD-10-CM | POA: Diagnosis not present

## 2023-02-23 DIAGNOSIS — I4891 Unspecified atrial fibrillation: Secondary | ICD-10-CM | POA: Diagnosis not present

## 2023-02-23 DIAGNOSIS — I080 Rheumatic disorders of both mitral and aortic valves: Secondary | ICD-10-CM | POA: Diagnosis not present

## 2023-02-23 DIAGNOSIS — J90 Pleural effusion, not elsewhere classified: Secondary | ICD-10-CM | POA: Diagnosis not present

## 2023-02-23 DIAGNOSIS — T502X5A Adverse effect of carbonic-anhydrase inhibitors, benzothiadiazides and other diuretics, initial encounter: Secondary | ICD-10-CM | POA: Diagnosis present

## 2023-02-23 DIAGNOSIS — Z7982 Long term (current) use of aspirin: Secondary | ICD-10-CM | POA: Diagnosis not present

## 2023-02-23 DIAGNOSIS — Z951 Presence of aortocoronary bypass graft: Secondary | ICD-10-CM | POA: Diagnosis not present

## 2023-02-23 DIAGNOSIS — Z79899 Other long term (current) drug therapy: Secondary | ICD-10-CM | POA: Diagnosis not present

## 2023-02-23 DIAGNOSIS — D62 Acute posthemorrhagic anemia: Secondary | ICD-10-CM | POA: Diagnosis not present

## 2023-02-23 DIAGNOSIS — I5022 Chronic systolic (congestive) heart failure: Secondary | ICD-10-CM | POA: Diagnosis not present

## 2023-02-23 DIAGNOSIS — I447 Left bundle-branch block, unspecified: Secondary | ICD-10-CM | POA: Diagnosis present

## 2023-02-23 DIAGNOSIS — I480 Paroxysmal atrial fibrillation: Secondary | ICD-10-CM

## 2023-02-23 DIAGNOSIS — I2510 Atherosclerotic heart disease of native coronary artery without angina pectoris: Secondary | ICD-10-CM | POA: Diagnosis not present

## 2023-02-23 DIAGNOSIS — Z7984 Long term (current) use of oral hypoglycemic drugs: Secondary | ICD-10-CM | POA: Diagnosis not present

## 2023-02-23 DIAGNOSIS — E119 Type 2 diabetes mellitus without complications: Secondary | ICD-10-CM | POA: Diagnosis not present

## 2023-02-23 DIAGNOSIS — D72828 Other elevated white blood cell count: Secondary | ICD-10-CM | POA: Diagnosis not present

## 2023-02-23 DIAGNOSIS — R32 Unspecified urinary incontinence: Secondary | ICD-10-CM | POA: Diagnosis present

## 2023-02-23 DIAGNOSIS — J918 Pleural effusion in other conditions classified elsewhere: Secondary | ICD-10-CM | POA: Diagnosis not present

## 2023-02-23 DIAGNOSIS — Z87891 Personal history of nicotine dependence: Secondary | ICD-10-CM | POA: Diagnosis not present

## 2023-02-23 DIAGNOSIS — I509 Heart failure, unspecified: Secondary | ICD-10-CM | POA: Diagnosis not present

## 2023-02-23 DIAGNOSIS — Z801 Family history of malignant neoplasm of trachea, bronchus and lung: Secondary | ICD-10-CM | POA: Diagnosis not present

## 2023-02-23 DIAGNOSIS — I110 Hypertensive heart disease with heart failure: Secondary | ICD-10-CM | POA: Diagnosis not present

## 2023-02-23 DIAGNOSIS — I493 Ventricular premature depolarization: Secondary | ICD-10-CM | POA: Diagnosis present

## 2023-02-23 DIAGNOSIS — J9601 Acute respiratory failure with hypoxia: Secondary | ICD-10-CM | POA: Diagnosis not present

## 2023-02-23 DIAGNOSIS — E871 Hypo-osmolality and hyponatremia: Secondary | ICD-10-CM | POA: Diagnosis not present

## 2023-02-23 LAB — CUP PACEART INCLINIC DEVICE CHECK
Date Time Interrogation Session: 20240614133128
HighPow Impedance: 57 Ohm
Implantable Lead Connection Status: 753985
Implantable Lead Implant Date: 20160308
Implantable Lead Location: 753860
Implantable Lead Model: 295
Implantable Lead Serial Number: 363165
Implantable Pulse Generator Implant Date: 20160308
Lead Channel Impedance Value: 407 Ohm
Lead Channel Pacing Threshold Amplitude: 0.6 V
Lead Channel Pacing Threshold Pulse Width: 0.5 ms
Lead Channel Sensing Intrinsic Amplitude: 23.5 mV
Lead Channel Setting Pacing Amplitude: 2.4 V
Lead Channel Setting Pacing Pulse Width: 0.5 ms
Lead Channel Setting Sensing Sensitivity: 0.6 mV
Pulse Gen Serial Number: 200269

## 2023-02-23 MED ORDER — CEFAZOLIN SODIUM-DEXTROSE 2-4 GM/100ML-% IV SOLN
2.0000 g | INTRAVENOUS | Status: AC
  Administered 2023-02-26: 2 g via INTRAVENOUS
  Filled 2023-02-23: qty 100

## 2023-02-23 MED ORDER — NOREPINEPHRINE 4 MG/250ML-% IV SOLN
0.0000 ug/min | INTRAVENOUS | Status: DC
  Filled 2023-02-23: qty 250

## 2023-02-23 MED ORDER — EPINEPHRINE HCL 5 MG/250ML IV SOLN IN NS
0.0000 ug/min | INTRAVENOUS | Status: AC
  Administered 2023-02-26: 1 ug/min via INTRAVENOUS
  Filled 2023-02-23: qty 250

## 2023-02-23 MED ORDER — MANNITOL 20 % IV SOLN
INTRAVENOUS | Status: DC
  Filled 2023-02-23: qty 13

## 2023-02-23 MED ORDER — TRANEXAMIC ACID (OHS) PUMP PRIME SOLUTION
2.0000 mg/kg | INTRAVENOUS | Status: DC
  Filled 2023-02-23: qty 2.19

## 2023-02-23 MED ORDER — POTASSIUM CHLORIDE 2 MEQ/ML IV SOLN
80.0000 meq | INTRAVENOUS | Status: DC
  Filled 2023-02-23: qty 40

## 2023-02-23 MED ORDER — MILRINONE LACTATE IN DEXTROSE 20-5 MG/100ML-% IV SOLN
0.3000 ug/kg/min | INTRAVENOUS | Status: DC
  Filled 2023-02-23: qty 100

## 2023-02-23 MED ORDER — PLASMA-LYTE A IV SOLN
INTRAVENOUS | Status: DC
  Filled 2023-02-23: qty 2.5

## 2023-02-23 MED ORDER — LEVOFLOXACIN 750 MG PO TABS: 750.0000 mg | ORAL_TABLET | Freq: Every day | ORAL | 0 refills | Status: DC

## 2023-02-23 MED ORDER — INSULIN REGULAR(HUMAN) IN NACL 100-0.9 UT/100ML-% IV SOLN
INTRAVENOUS | Status: AC
  Administered 2023-02-26: 4.2 [IU]/h via INTRAVENOUS
  Filled 2023-02-23: qty 100

## 2023-02-23 MED ORDER — PHENYLEPHRINE HCL-NACL 20-0.9 MG/250ML-% IV SOLN
30.0000 ug/min | INTRAVENOUS | Status: AC
  Administered 2023-02-26: 25 ug/min via INTRAVENOUS
  Filled 2023-02-23: qty 250

## 2023-02-23 MED ORDER — TRANEXAMIC ACID 1000 MG/10ML IV SOLN
1.5000 mg/kg/h | INTRAVENOUS | Status: AC
  Administered 2023-02-26: 1.5 mg/kg/h via INTRAVENOUS
  Filled 2023-02-23: qty 25

## 2023-02-23 MED ORDER — DEXMEDETOMIDINE HCL IN NACL 400 MCG/100ML IV SOLN
0.1000 ug/kg/h | INTRAVENOUS | Status: AC
  Administered 2023-02-26: .2 ug/kg/h via INTRAVENOUS
  Filled 2023-02-23: qty 100

## 2023-02-23 MED ORDER — HEPARIN 30,000 UNITS/1000 ML (OHS) CELLSAVER SOLUTION
Status: DC
  Filled 2023-02-23: qty 1000

## 2023-02-23 MED ORDER — TRANEXAMIC ACID (OHS) BOLUS VIA INFUSION
15.0000 mg/kg | INTRAVENOUS | Status: AC
  Administered 2023-02-26: 1642.5 mg via INTRAVENOUS
  Filled 2023-02-23: qty 1643

## 2023-02-23 MED ORDER — VANCOMYCIN HCL 1500 MG/300ML IV SOLN
1500.0000 mg | INTRAVENOUS | Status: AC
  Administered 2023-02-26: 1500 mg via INTRAVENOUS
  Filled 2023-02-23: qty 300

## 2023-02-23 MED ORDER — NITROGLYCERIN IN D5W 200-5 MCG/ML-% IV SOLN
2.0000 ug/min | INTRAVENOUS | Status: DC
  Filled 2023-02-23: qty 250

## 2023-02-23 NOTE — Patient Instructions (Signed)
Medication Instructions:  Your physician recommends that you continue on your current medications as directed. Please refer to the Current Medication list given to you today.  *If you need a refill on your cardiac medications before your next appointment, please call your pharmacy*   Lab Work: No labs ordered  If you have labs (blood work) drawn today and your tests are completely normal, you will receive your results only by: MyChart Message (if you have MyChart) OR A paper copy in the mail If you have any lab test that is abnormal or we need to change your treatment, we will call you to review the results.   Testing/Procedures: No testing ordered  Follow-Up: At [facility], you and your health needs are our priority.  As part of our continuing mission to provide you with exceptional heart care, we have created designated Provider Care Teams.  These Care Teams include your primary Cardiologist (physician) and Advanced Practice Providers (APPs -  Physician Assistants and Nurse Practitioners) who all work together to provide you with the care you need, when you need it.  We recommend signing up for the patient portal called "MyChart".  Sign up information is provided on this After Visit Summary.  MyChart is used to connect with patients for Virtual Visits (Telemedicine).  Patients are able to view lab/test results, encounter notes, upcoming appointments, etc.  Non-urgent messages can be sent to your provider as well.   To learn more about what you can do with MyChart, go to [email or web address].    Your next appointment:   1 year(s)  Provider:   Dr. Elberta Fortis

## 2023-02-23 NOTE — Progress Notes (Signed)
Per Webb Laws, PA, Levaquin sent to patient's preferred pharmacy per PAT UA results. Patient aware.

## 2023-02-23 NOTE — Progress Notes (Signed)
Perioperative device orders received. Informed Joey from [employer] of orders-  Have magnet available. Provide continuous ECG monitoring when magnet is used or reprogramming is to be performed.  Procedure will likely interfere with device function.  Device should be programmed:  Tachy therapies disabled. Joey states he will be here for patients surgery on 02/26/23 at 0730.

## 2023-02-23 NOTE — Progress Notes (Signed)
PERIOPERATIVE PRESCRIPTION FOR IMPLANTED CARDIAC DEVICE PROGRAMMING  Patient Information: Name:  Bobby Bradley  DOB:  27-Dec-1945  MRN:  [id number]   Planned Procedure:  CABG,clipping of left atrial appendage  Surgeon:  Dr. Brynda Greathouse  Date of Procedure:  02/26/2023  Cautery will be used.  Position during surgery:  supine   Device Information:  Clinic EP Physician:  Loman Brooklyn, MD   Device Type:  Defibrillator Manufacturer and Phone #:  [employer]: [phone] Pacemaker Dependent?:  No. Date of Last Device Check:  02/23/2023 Normal Device Function?:  Yes.    Electrophysiologist's Recommendations:  Have magnet available. Provide continuous ECG monitoring when magnet is used or reprogramming is to be performed.  Procedure will likely interfere with device function.  Device should be programmed:  Tachy therapies disabled  Per Device Clinic Standing Orders, Wiliam Ke, RN  1:24 PM 02/23/2023

## 2023-02-23 NOTE — Progress Notes (Signed)
Cardiology Office Note Date:  02/23/2023  Patient ID:  Bobby Bradley, Bobby Bradley 07/17/1946, MRN [id number] PCP:  Pearline Cables, MD  Cardiologist:  [name], MD Electrophysiologist: Will Jorja Loa, MD   Chief Complaint: ICD follow-up  History of Present Illness: Bobby Bradley is a 77 y.o. male with PMH notable for parox AFib, CAD, cardiomyopathy, ICD, HTN, TIAs; seen today for Will Jorja Loa, MD for device clearance for CABG. Last saw Dr. Elberta Fortis 10/2020.  In the interim he has been with patient CAD, HFrEF and is planning for CABG next week with Dr. Cliffton Asters.  No device complaints. Main CAD symptom is SOB with minimal exertion.    Device Information: [employer] single chamber ICD, imp 11/2014; NICM   Past Medical History:  Diagnosis Date   A-fib (HCC)    AICD (automatic cardioverter/defibrillator) present    CHF (congestive heart failure) (HCC)    Colon polyps    Diabetes mellitus without complication (HCC)    Dysrhythmia    Essential hypertension    Hyperlipidemia    Non-ischemic cardiomyopathy (HCC)    Presence of combination internal cardiac defibrillator (ICD) and pacemaker    Presence of permanent cardiac pacemaker    Prostate cancer (HCC)    Stroke (HCC)    TIA- 1994   TIA (transient ischemic attack)     Past Surgical History:  Procedure Laterality Date   BACK SURGERY     CARDIAC CATHETERIZATION     COLONOSCOPY     about 10 years. Done in [place] or [place]    ICD IMPLANT  2012   PROSTATECTOMY     RIGHT/LEFT HEART CATH AND CORONARY ANGIOGRAPHY N/A 02/12/2023   Procedure: RIGHT/LEFT HEART CATH AND CORONARY ANGIOGRAPHY;  Surgeon: Kathleene Hazel, MD;  Location: [facility] INVASIVE CV LAB;  Service: Cardiovascular;  Laterality: N/A;    Current Outpatient Medications  Medication Instructions   amLODipine (NORVASC) 10 MG tablet TAKE 1 TABLET(10 MG) BY MOUTH DAILY   apixaban (ELIQUIS) 5 MG TABS tablet TAKE 1 TABLET(5 MG) BY  MOUTH TWICE DAILY   aspirin EC 81 mg, Oral, Daily, Swallow whole.   carvedilol (COREG) 25 MG tablet TAKE 1 TABLET(25 MG) BY MOUTH TWICE DAILY   cetirizine (ZYRTEC) 10 mg, Oral, Daily   ferrous sulfate 325 mg, Oral, Daily with breakfast   losartan (COZAAR) 100 MG tablet TAKE 1 TABLET(100 MG) BY MOUTH DAILY   metFORMIN (GLUCOPHAGE) 500 MG tablet TAKE 1 TABLET(500 MG) BY MOUTH TWICE DAILY WITH A MEAL   Multiple Vitamin (MULTI VITAMIN DAILY PO) 1 tablet, Oral, Daily   rosuvastatin (CRESTOR) 40 mg, Oral, Daily   spironolactone (ALDACTONE) 25 mg, Oral, Daily    Social History:  The patient  reports that he quit smoking about 39 years ago. His smoking use included cigarettes. He has never been exposed to tobacco smoke. He has never used smokeless tobacco. He reports current alcohol use. He reports that he does not use drugs.   Family History:  The patient's family history includes Atrial fibrillation in his sister; Lung cancer in his brother, father, and mother; Uterine cancer in his mother.  ROS:  Please see the history of present illness. All other systems are reviewed and otherwise negative.   PHYSICAL EXAM:  VS:  BP 130/72 (BP Location: Left Arm, Patient Position: Sitting, Cuff Size: Large)   Pulse 63   Ht 6' (1.829 m)   Wt 242 lb (109.8 kg)   SpO2 97%   BMI 32.82 kg/m  BMI: Body mass index is 32.82 kg/m.  GEN- The patient is well appearing, alert and oriented x 3 today.   Lungs- Clear to ausculation bilaterally, normal work of breathing.  Heart- Regular rate and rhythm, no murmurs, rubs or gallops Extremities- No peripheral edema, warm, dry Skin-   device pocket well-healed, no tethering   Device interrogation done today and reviewed by myself:  Battery 8 years Lead threshold, impedence, sensing stable  Brief AFib episode No changes made today  EKG is not ordered. Personal review of EKG from  02/08/2023  shows: Sinus rhythm with first-degree AV block, rate 61   Recent  Labs: 08/31/2022: Magnesium 2.3; TSH 2.110 02/22/2023: ALT 25; BUN 13; Creatinine, Ser 1.03; Hemoglobin 14.6; Platelets 235; Potassium 4.3; Sodium 137  11/13/2022: Cholesterol 124; Direct LDL 55.0; HDL 32.30; Total CHOL/HDL Ratio 4; Triglycerides 321.0; VLDL 64.2   Estimated Creatinine Clearance: 79.3 mL/min (by C-G formula based on SCr of 1.03 mg/dL).   Wt Readings from Last 3 Encounters:  02/23/23 242 lb (109.8 kg)  02/22/23 241 lb 8 oz (109.5 kg)  02/16/23 246 lb (111.6 kg)     Additional studies reviewed include: Previous EP, cardiology notes.     ASSESSMENT AND PLAN:  #) NICM s/p Bos Sci ICD Device functioning well, see Paceart for details No ventricular arrhythmias Rare paroxysmal A-fib episodes noted   #) parox AFib Rare paroxysmal A-fib episodes noted by device Patient is asymptomatic  #) Hypercoag d/t AFib CHA2DS2-VASc Score = 7 [CHF History: 1, HTN History: 1, Diabetes History: 1, Stroke History: 2, Vascular Disease History: 0, Age Score: 2, Gender Score: 0].  Therefore, the patient's annual risk of stroke is 11.2 % NOAC - eliquis 5mg  BID, appropriately dosed No bleeding concerns Eliquis currently being held for CABG procedure next week.     Current medicines are reviewed at length with the patient today.   The patient does not have concerns regarding his medicines.  The following changes were made today:  none  Labs/ tests ordered today include:  No orders of the defined types were placed in this encounter.    Disposition: Follow up with Dr. Elberta Fortis in 12 months in [facility] [place] office   Signed, Sherie Don, NP  02/23/23  1:26 PM  Electrophysiology [facility]

## 2023-02-26 ENCOUNTER — Other Ambulatory Visit: Payer: Self-pay

## 2023-02-26 ENCOUNTER — Inpatient Hospital Stay (HOSPITAL_COMMUNITY): Payer: Medicare Other

## 2023-02-26 ENCOUNTER — Inpatient Hospital Stay (HOSPITAL_COMMUNITY): Payer: Medicare Other | Admitting: Physician Assistant

## 2023-02-26 ENCOUNTER — Inpatient Hospital Stay (HOSPITAL_COMMUNITY): Payer: Medicare Other | Admitting: Certified Registered Nurse Anesthetist

## 2023-02-26 ENCOUNTER — Inpatient Hospital Stay (HOSPITAL_COMMUNITY)
Admission: RE | Admit: 2023-02-26 | Discharge: 2023-03-03 | DRG: 236 | Disposition: A | Discharge status: Home Health | Payer: Medicare Other | Source: Ambulatory Visit | Attending: Thoracic Surgery (Cardiothoracic Vascular Surgery) | Admitting: Thoracic Surgery (Cardiothoracic Vascular Surgery)

## 2023-02-26 ENCOUNTER — Encounter (HOSPITAL_COMMUNITY): Payer: Self-pay | Admitting: Thoracic Surgery (Cardiothoracic Vascular Surgery)

## 2023-02-26 ENCOUNTER — Inpatient Hospital Stay (HOSPITAL_COMMUNITY)
Admission: RE | Disposition: A | Discharge status: Home Health | Payer: Self-pay | Source: Ambulatory Visit | Attending: Thoracic Surgery (Cardiothoracic Vascular Surgery)

## 2023-02-26 DIAGNOSIS — E871 Hypo-osmolality and hyponatremia: Secondary | ICD-10-CM | POA: Diagnosis present

## 2023-02-26 DIAGNOSIS — Z7982 Long term (current) use of aspirin: Secondary | ICD-10-CM | POA: Diagnosis not present

## 2023-02-26 DIAGNOSIS — T502X5A Adverse effect of carbonic-anhydrase inhibitors, benzothiadiazides and other diuretics, initial encounter: Secondary | ICD-10-CM | POA: Diagnosis present

## 2023-02-26 DIAGNOSIS — I447 Left bundle-branch block, unspecified: Secondary | ICD-10-CM | POA: Diagnosis present

## 2023-02-26 DIAGNOSIS — I110 Hypertensive heart disease with heart failure: Secondary | ICD-10-CM | POA: Diagnosis present

## 2023-02-26 DIAGNOSIS — R079 Chest pain, unspecified: Secondary | ICD-10-CM | POA: Diagnosis not present

## 2023-02-26 DIAGNOSIS — J90 Pleural effusion, not elsewhere classified: Secondary | ICD-10-CM | POA: Diagnosis not present

## 2023-02-26 DIAGNOSIS — I428 Other cardiomyopathies: Secondary | ICD-10-CM | POA: Diagnosis present

## 2023-02-26 DIAGNOSIS — I080 Rheumatic disorders of both mitral and aortic valves: Secondary | ICD-10-CM | POA: Diagnosis not present

## 2023-02-26 DIAGNOSIS — E785 Hyperlipidemia, unspecified: Secondary | ICD-10-CM | POA: Diagnosis present

## 2023-02-26 DIAGNOSIS — Z7901 Long term (current) use of anticoagulants: Secondary | ICD-10-CM | POA: Diagnosis not present

## 2023-02-26 DIAGNOSIS — R32 Unspecified urinary incontinence: Secondary | ICD-10-CM | POA: Diagnosis present

## 2023-02-26 DIAGNOSIS — Z8546 Personal history of malignant neoplasm of prostate: Secondary | ICD-10-CM | POA: Diagnosis not present

## 2023-02-26 DIAGNOSIS — J9811 Atelectasis: Secondary | ICD-10-CM | POA: Diagnosis not present

## 2023-02-26 DIAGNOSIS — I25110 Atherosclerotic heart disease of native coronary artery with unstable angina pectoris: Secondary | ICD-10-CM | POA: Diagnosis not present

## 2023-02-26 DIAGNOSIS — Z95810 Presence of automatic (implantable) cardiac defibrillator: Secondary | ICD-10-CM | POA: Diagnosis not present

## 2023-02-26 DIAGNOSIS — I4891 Unspecified atrial fibrillation: Secondary | ICD-10-CM

## 2023-02-26 DIAGNOSIS — D62 Acute posthemorrhagic anemia: Secondary | ICD-10-CM | POA: Diagnosis not present

## 2023-02-26 DIAGNOSIS — I5022 Chronic systolic (congestive) heart failure: Secondary | ICD-10-CM | POA: Diagnosis present

## 2023-02-26 DIAGNOSIS — J918 Pleural effusion in other conditions classified elsewhere: Secondary | ICD-10-CM | POA: Diagnosis not present

## 2023-02-26 DIAGNOSIS — I480 Paroxysmal atrial fibrillation: Secondary | ICD-10-CM | POA: Diagnosis present

## 2023-02-26 DIAGNOSIS — I493 Ventricular premature depolarization: Secondary | ICD-10-CM | POA: Diagnosis present

## 2023-02-26 DIAGNOSIS — Z951 Presence of aortocoronary bypass graft: Secondary | ICD-10-CM | POA: Diagnosis not present

## 2023-02-26 DIAGNOSIS — D72828 Other elevated white blood cell count: Secondary | ICD-10-CM | POA: Diagnosis not present

## 2023-02-26 DIAGNOSIS — E119 Type 2 diabetes mellitus without complications: Secondary | ICD-10-CM | POA: Diagnosis present

## 2023-02-26 DIAGNOSIS — Z7984 Long term (current) use of oral hypoglycemic drugs: Secondary | ICD-10-CM

## 2023-02-26 DIAGNOSIS — I2510 Atherosclerotic heart disease of native coronary artery without angina pectoris: Secondary | ICD-10-CM

## 2023-02-26 DIAGNOSIS — Z87891 Personal history of nicotine dependence: Secondary | ICD-10-CM

## 2023-02-26 DIAGNOSIS — R0902 Hypoxemia: Secondary | ICD-10-CM | POA: Diagnosis not present

## 2023-02-26 DIAGNOSIS — Z79899 Other long term (current) drug therapy: Secondary | ICD-10-CM

## 2023-02-26 DIAGNOSIS — J9601 Acute respiratory failure with hypoxia: Secondary | ICD-10-CM | POA: Diagnosis not present

## 2023-02-26 DIAGNOSIS — I10 Essential (primary) hypertension: Secondary | ICD-10-CM

## 2023-02-26 DIAGNOSIS — Z8673 Personal history of transient ischemic attack (TIA), and cerebral infarction without residual deficits: Secondary | ICD-10-CM

## 2023-02-26 DIAGNOSIS — I509 Heart failure, unspecified: Secondary | ICD-10-CM | POA: Diagnosis not present

## 2023-02-26 DIAGNOSIS — R9439 Abnormal result of other cardiovascular function study: Secondary | ICD-10-CM

## 2023-02-26 DIAGNOSIS — Z888 Allergy status to other drugs, medicaments and biological substances status: Secondary | ICD-10-CM

## 2023-02-26 DIAGNOSIS — Z801 Family history of malignant neoplasm of trachea, bronchus and lung: Secondary | ICD-10-CM

## 2023-02-26 HISTORY — PX: TEE WITHOUT CARDIOVERSION: SHX5443

## 2023-02-26 HISTORY — PX: CORONARY ARTERY BYPASS GRAFT: SHX141

## 2023-02-26 HISTORY — PX: CLIPPING OF ATRIAL APPENDAGE: SHX5773

## 2023-02-26 LAB — POCT I-STAT 7, (LYTES, BLD GAS, ICA,H+H)
Acid-Base Excess: 0 mmol/L (ref 0.0–2.0)
Acid-base deficit: 1 mmol/L (ref 0.0–2.0)
Acid-base deficit: 3 mmol/L — ABNORMAL HIGH (ref 0.0–2.0)
Acid-base deficit: 6 mmol/L — ABNORMAL HIGH (ref 0.0–2.0)
Acid-base deficit: 3 mmol/L — ABNORMAL HIGH (ref 0.0–2.0)
Bicarbonate: 24 mmol/L (ref 20.0–28.0)
Bicarbonate: 23.8 mmol/L (ref 20.0–28.0)
Bicarbonate: 22.6 mmol/L (ref 20.0–28.0)
Bicarbonate: 19.1 mmol/L — ABNORMAL LOW (ref 20.0–28.0)
Bicarbonate: 22.4 mmol/L (ref 20.0–28.0)
Calcium, Ion: 1.11 mmol/L — ABNORMAL LOW (ref 1.15–1.40)
Calcium, Ion: 1.14 mmol/L — ABNORMAL LOW (ref 1.15–1.40)
Calcium, Ion: 1.3 mmol/L (ref 1.15–1.40)
Calcium, Ion: 1.16 mmol/L (ref 1.15–1.40)
Calcium, Ion: 1.25 mmol/L (ref 1.15–1.40)
HCT: 27 % — ABNORMAL LOW (ref 39.0–52.0)
HCT: 28 % — ABNORMAL LOW (ref 39.0–52.0)
HCT: 32 % — ABNORMAL LOW (ref 39.0–52.0)
HCT: 32 % — ABNORMAL LOW (ref 39.0–52.0)
HCT: 36 % — ABNORMAL LOW (ref 39.0–52.0)
Hemoglobin: 9.2 g/dL — ABNORMAL LOW (ref 13.0–17.0)
Hemoglobin: 9.5 g/dL — ABNORMAL LOW (ref 13.0–17.0)
Hemoglobin: 10.9 g/dL — ABNORMAL LOW (ref 13.0–17.0)
Hemoglobin: 10.9 g/dL — ABNORMAL LOW (ref 13.0–17.0)
Hemoglobin: 12.2 g/dL — ABNORMAL LOW (ref 13.0–17.0)
O2 Saturation: 100 %
O2 Saturation: 100 %
O2 Saturation: 96 %
O2 Saturation: 99 %
O2 Saturation: 97 %
Patient temperature: 36.6
Patient temperature: 36.8
Patient temperature: 37.3
Potassium: 4.2 mmol/L (ref 3.5–5.1)
Potassium: 5.1 mmol/L (ref 3.5–5.1)
Potassium: 4.8 mmol/L (ref 3.5–5.1)
Potassium: 4.6 mmol/L (ref 3.5–5.1)
Potassium: 5.2 mmol/L — ABNORMAL HIGH (ref 3.5–5.1)
Sodium: 135 mmol/L (ref 135–145)
Sodium: 137 mmol/L (ref 135–145)
Sodium: 137 mmol/L (ref 135–145)
Sodium: 139 mmol/L (ref 135–145)
Sodium: 136 mmol/L (ref 135–145)
TCO2: 25 mmol/L (ref 22–32)
TCO2: 25 mmol/L (ref 22–32)
TCO2: 24 mmol/L (ref 22–32)
TCO2: 20 mmol/L — ABNORMAL LOW (ref 22–32)
TCO2: 24 mmol/L (ref 22–32)
pCO2 arterial: 40.8 mmHg (ref 32–48)
pCO2 arterial: 36.1 mmHg (ref 32–48)
pCO2 arterial: 42.1 mmHg (ref 32–48)
pCO2 arterial: 34.1 mmHg (ref 32–48)
pCO2 arterial: 42.8 mmHg (ref 32–48)
pH, Arterial: 7.378 (ref 7.35–7.45)
pH, Arterial: 7.428 (ref 7.35–7.45)
pH, Arterial: 7.336 — ABNORMAL LOW (ref 7.35–7.45)
pH, Arterial: 7.357 (ref 7.35–7.45)
pH, Arterial: 7.327 — ABNORMAL LOW (ref 7.35–7.45)
pO2, Arterial: 318 mmHg — ABNORMAL HIGH (ref 83–108)
pO2, Arterial: 281 mmHg — ABNORMAL HIGH (ref 83–108)
pO2, Arterial: 85 mmHg (ref 83–108)
pO2, Arterial: 119 mmHg — ABNORMAL HIGH (ref 83–108)
pO2, Arterial: 97 mmHg (ref 83–108)

## 2023-02-26 LAB — BASIC METABOLIC PANEL
Anion gap: 10 (ref 5–15)
BUN: 17 mg/dL (ref 8–23)
CO2: 20 mmol/L — ABNORMAL LOW (ref 22–32)
Calcium: 8.2 mg/dL — ABNORMAL LOW (ref 8.9–10.3)
Chloride: 105 mmol/L (ref 98–111)
Creatinine, Ser: 1.13 mg/dL (ref 0.61–1.24)
GFR, Estimated: >60 mL/min (ref >60)
Glucose, Bld: 127 mg/dL — ABNORMAL HIGH (ref 70–99)
Potassium: 5 mmol/L (ref 3.5–5.1)
Sodium: 135 mmol/L (ref 135–145)

## 2023-02-26 LAB — POCT I-STAT, CHEM 8
BUN: 21 mg/dL (ref 8–23)
BUN: 19 mg/dL (ref 8–23)
BUN: 20 mg/dL (ref 8–23)
BUN: 22 mg/dL (ref 8–23)
Calcium, Ion: 1.32 mmol/L (ref 1.15–1.40)
Calcium, Ion: 1.21 mmol/L (ref 1.15–1.40)
Calcium, Ion: 1.16 mmol/L (ref 1.15–1.40)
Calcium, Ion: 1.33 mmol/L (ref 1.15–1.40)
Chloride: 103 mmol/L (ref 98–111)
Chloride: 106 mmol/L (ref 98–111)
Chloride: 105 mmol/L (ref 98–111)
Chloride: 107 mmol/L (ref 98–111)
Creatinine, Ser: 1.1 mg/dL (ref 0.61–1.24)
Creatinine, Ser: 1 mg/dL (ref 0.61–1.24)
Creatinine, Ser: 0.9 mg/dL (ref 0.61–1.24)
Creatinine, Ser: 1.1 mg/dL (ref 0.61–1.24)
Glucose, Bld: 160 mg/dL — ABNORMAL HIGH (ref 70–99)
Glucose, Bld: 133 mg/dL — ABNORMAL HIGH (ref 70–99)
Glucose, Bld: 142 mg/dL — ABNORMAL HIGH (ref 70–99)
Glucose, Bld: 162 mg/dL — ABNORMAL HIGH (ref 70–99)
HCT: 35 % — ABNORMAL LOW (ref 39.0–52.0)
HCT: 32 % — ABNORMAL LOW (ref 39.0–52.0)
HCT: 26 % — ABNORMAL LOW (ref 39.0–52.0)
HCT: 27 % — ABNORMAL LOW (ref 39.0–52.0)
Hemoglobin: 11.9 g/dL — ABNORMAL LOW (ref 13.0–17.0)
Hemoglobin: 10.9 g/dL — ABNORMAL LOW (ref 13.0–17.0)
Hemoglobin: 8.8 g/dL — ABNORMAL LOW (ref 13.0–17.0)
Hemoglobin: 9.2 g/dL — ABNORMAL LOW (ref 13.0–17.0)
Potassium: 4.5 mmol/L (ref 3.5–5.1)
Potassium: 4 mmol/L (ref 3.5–5.1)
Potassium: 4.7 mmol/L (ref 3.5–5.1)
Potassium: 5.4 mmol/L — ABNORMAL HIGH (ref 3.5–5.1)
Sodium: 138 mmol/L (ref 135–145)
Sodium: 139 mmol/L (ref 135–145)
Sodium: 137 mmol/L (ref 135–145)
Sodium: 138 mmol/L (ref 135–145)
TCO2: 26 mmol/L (ref 22–32)
TCO2: 22 mmol/L (ref 22–32)
TCO2: 24 mmol/L (ref 22–32)
TCO2: 28 mmol/L (ref 22–32)

## 2023-02-26 LAB — POCT I-STAT EG7
Acid-base deficit: 1 mmol/L (ref 0.0–2.0)
Bicarbonate: 24.3 mmol/L (ref 20.0–28.0)
Calcium, Ion: 1.11 mmol/L — ABNORMAL LOW (ref 1.15–1.40)
HCT: 26 % — ABNORMAL LOW (ref 39.0–52.0)
Hemoglobin: 8.8 g/dL — ABNORMAL LOW (ref 13.0–17.0)
O2 Saturation: 84 %
Potassium: 5.5 mmol/L — ABNORMAL HIGH (ref 3.5–5.1)
Sodium: 137 mmol/L (ref 135–145)
TCO2: 26 mmol/L (ref 22–32)
pCO2, Ven: 42.6 mmHg — ABNORMAL LOW (ref 44–60)
pH, Ven: 7.365 (ref 7.25–7.43)
pO2, Ven: 51 mmHg — ABNORMAL HIGH (ref 32–45)

## 2023-02-26 LAB — BLOOD GAS, ARTERIAL
Acid-base deficit: 0.6 mmol/L (ref 0.0–2.0)
Bicarbonate: 24.9 mmol/L (ref 20.0–28.0)
Drawn by: 305741
O2 Saturation: 99.4 %
Patient temperature: 37
pCO2 arterial: 43 mmHg (ref 32–48)
pH, Arterial: 7.37 (ref 7.35–7.45)
pO2, Arterial: 128 mmHg — ABNORMAL HIGH (ref 83–108)

## 2023-02-26 LAB — CBC
HCT: 32.7 % — ABNORMAL LOW (ref 39.0–52.0)
HCT: 38.1 % — ABNORMAL LOW (ref 39.0–52.0)
Hemoglobin: 11 g/dL — ABNORMAL LOW (ref 13.0–17.0)
Hemoglobin: 13.1 g/dL (ref 13.0–17.0)
MCH: 29.5 pg (ref 26.0–34.0)
MCH: 30.3 pg (ref 26.0–34.0)
MCHC: 33.6 g/dL (ref 30.0–36.0)
MCHC: 34.4 g/dL (ref 30.0–36.0)
MCV: 87.7 fL (ref 80.0–100.0)
MCV: 88 fL (ref 80.0–100.0)
Platelets: 190 10*3/uL (ref 150–400)
Platelets: 231 10*3/uL (ref 150–400)
RBC: 3.73 MIL/uL — ABNORMAL LOW (ref 4.22–5.81)
RBC: 4.33 MIL/uL (ref 4.22–5.81)
RDW: 12.8 % (ref 11.5–15.5)
RDW: 12.8 % (ref 11.5–15.5)
WBC: 19.7 10*3/uL — ABNORMAL HIGH (ref 4.0–10.5)
WBC: 16.6 10*3/uL — ABNORMAL HIGH (ref 4.0–10.5)
nRBC: 0 % (ref 0.0–0.2)
nRBC: 0 % (ref 0.0–0.2)

## 2023-02-26 LAB — HEMOGLOBIN AND HEMATOCRIT, BLOOD
HCT: 28.2 % — ABNORMAL LOW (ref 39.0–52.0)
Hemoglobin: 9.7 g/dL — ABNORMAL LOW (ref 13.0–17.0)

## 2023-02-26 LAB — ECHO INTRAOPERATIVE TEE: Weight: 3871.98 oz

## 2023-02-26 LAB — GLUCOSE, CAPILLARY
Glucose-Capillary: 191 mg/dL — ABNORMAL HIGH (ref 70–99)
Glucose-Capillary: 137 mg/dL — ABNORMAL HIGH (ref 70–99)
Glucose-Capillary: 178 mg/dL — ABNORMAL HIGH (ref 70–99)
Glucose-Capillary: 176 mg/dL — ABNORMAL HIGH (ref 70–99)
Glucose-Capillary: 145 mg/dL — ABNORMAL HIGH (ref 70–99)
Glucose-Capillary: 148 mg/dL — ABNORMAL HIGH (ref 70–99)
Glucose-Capillary: 121 mg/dL — ABNORMAL HIGH (ref 70–99)
Glucose-Capillary: 130 mg/dL — ABNORMAL HIGH (ref 70–99)
Glucose-Capillary: 134 mg/dL — ABNORMAL HIGH (ref 70–99)
Glucose-Capillary: 128 mg/dL — ABNORMAL HIGH (ref 70–99)

## 2023-02-26 LAB — PLATELET COUNT: Platelets: 201 10*3/uL (ref 150–400)

## 2023-02-26 LAB — APTT: aPTT: 31 seconds (ref 24–36)

## 2023-02-26 LAB — MAGNESIUM: Magnesium: 3.1 mg/dL — ABNORMAL HIGH (ref 1.7–2.4)

## 2023-02-26 LAB — PROTIME-INR
INR: 1.3 — ABNORMAL HIGH (ref 0.8–1.2)
Prothrombin Time: 16.5 seconds — ABNORMAL HIGH (ref 11.4–15.2)

## 2023-02-26 LAB — ABO/RH: ABO/RH(D): O POS

## 2023-02-26 SURGERY — CORONARY ARTERY BYPASS GRAFTING (CABG)
Anesthesia: General | Site: Chest

## 2023-02-26 MED ORDER — PANTOPRAZOLE SODIUM 40 MG PO TBEC
40.0000 mg | DELAYED_RELEASE_TABLET | Freq: Every day | ORAL | Status: DC
  Administered 2023-02-28 – 2023-03-03 (×4): 40 mg via ORAL
  Filled 2023-02-26 (×4): qty 1

## 2023-02-26 MED ORDER — ASPIRIN 81 MG PO CHEW: 324.0000 mg | CHEWABLE_TABLET | Freq: Every day | ORAL | Status: DC

## 2023-02-26 MED ORDER — SODIUM CHLORIDE 0.9% FLUSH: 3.0000 mL | INTRAVENOUS | Status: DC | PRN

## 2023-02-26 MED ORDER — ROCURONIUM BROMIDE 10 MG/ML (PF) SYRINGE
PREFILLED_SYRINGE | INTRAVENOUS | Status: AC
  Filled 2023-02-26: qty 10

## 2023-02-26 MED ORDER — FENTANYL CITRATE (PF) 250 MCG/5ML IJ SOLN
INTRAMUSCULAR | Status: AC
  Filled 2023-02-26: qty 5

## 2023-02-26 MED ORDER — ALBUMIN HUMAN 5 % IV SOLN: 250.0000 mL | INTRAVENOUS | Status: DC | PRN

## 2023-02-26 MED ORDER — PLASMA-LYTE A IV SOLN
INTRAVENOUS | Status: DC | PRN
  Administered 2023-02-26: 500 mL

## 2023-02-26 MED ORDER — BISACODYL 10 MG RE SUPP: 10.0000 mg | Freq: Every day | RECTAL | Status: DC

## 2023-02-26 MED ORDER — FENTANYL CITRATE (PF) 250 MCG/5ML IJ SOLN
INTRAMUSCULAR | Status: DC | PRN
  Administered 2023-02-26 (×2): 150 ug via INTRAVENOUS
  Administered 2023-02-26: 100 ug via INTRAVENOUS
  Administered 2023-02-26 (×5): 50 ug via INTRAVENOUS

## 2023-02-26 MED ORDER — ACETAMINOPHEN 160 MG/5ML PO SOLN: 650.0000 mg | Freq: Once | ORAL | Status: AC

## 2023-02-26 MED ORDER — CHLORHEXIDINE GLUCONATE 0.12 % MT SOLN
15.0000 mL | Freq: Once | OROMUCOSAL | Status: AC
  Administered 2023-02-26: 15 mL via OROMUCOSAL
  Filled 2023-02-26: qty 15

## 2023-02-26 MED ORDER — METOPROLOL TARTRATE 5 MG/5ML IV SOLN: 2.5000 mg | INTRAVENOUS | Status: DC | PRN

## 2023-02-26 MED ORDER — SODIUM CHLORIDE (PF) 0.9 % IJ SOLN: OROMUCOSAL | Status: DC | PRN

## 2023-02-26 MED ORDER — PROPOFOL 10 MG/ML IV BOLUS
INTRAVENOUS | Status: DC | PRN
  Administered 2023-02-26 (×2): 10 mg via INTRAVENOUS
  Administered 2023-02-26: 80 mg via INTRAVENOUS

## 2023-02-26 MED ORDER — ORAL CARE MOUTH RINSE: 15.0000 mL | OROMUCOSAL | Status: DC | PRN

## 2023-02-26 MED ORDER — LACTATED RINGERS IV SOLN: INTRAVENOUS | Status: DC

## 2023-02-26 MED ORDER — NITROGLYCERIN IN D5W 200-5 MCG/ML-% IV SOLN: 0.0000 ug/min | INTRAVENOUS | Status: DC

## 2023-02-26 MED ORDER — LIDOCAINE 2% (20 MG/ML) 5 ML SYRINGE
INTRAMUSCULAR | Status: DC | PRN
  Administered 2023-02-26: 100 mg via INTRAVENOUS

## 2023-02-26 MED ORDER — DEXMEDETOMIDINE HCL IN NACL 400 MCG/100ML IV SOLN: 0.0000 ug/kg/h | INTRAVENOUS | Status: DC

## 2023-02-26 MED ORDER — 0.9 % SODIUM CHLORIDE (POUR BTL) OPTIME
TOPICAL | Status: DC | PRN
  Administered 2023-02-26: 5000 mL

## 2023-02-26 MED ORDER — PANTOPRAZOLE SODIUM 40 MG IV SOLR
40.0000 mg | Freq: Every day | INTRAVENOUS | Status: AC
  Administered 2023-02-26 – 2023-02-27 (×2): 40 mg via INTRAVENOUS
  Filled 2023-02-26 (×2): qty 10

## 2023-02-26 MED ORDER — ASPIRIN 81 MG PO CHEW
324.0000 mg | CHEWABLE_TABLET | Freq: Once | ORAL | Status: AC
  Administered 2023-02-26: 324 mg via ORAL
  Filled 2023-02-26: qty 4

## 2023-02-26 MED ORDER — MIDAZOLAM HCL (PF) 10 MG/2ML IJ SOLN
INTRAMUSCULAR | Status: AC
  Filled 2023-02-26: qty 2

## 2023-02-26 MED ORDER — ONDANSETRON HCL 4 MG/2ML IJ SOLN: 4.0000 mg | Freq: Four times a day (QID) | INTRAMUSCULAR | Status: DC | PRN

## 2023-02-26 MED ORDER — OXYCODONE HCL 5 MG PO TABS
5.0000 mg | ORAL_TABLET | ORAL | Status: DC | PRN
  Administered 2023-02-27 (×2): 5 mg via ORAL
  Administered 2023-02-27 (×2): 10 mg via ORAL
  Administered 2023-02-28: 5 mg via ORAL
  Administered 2023-02-28: 10 mg via ORAL
  Filled 2023-02-26: qty 1
  Filled 2023-02-26 (×2): qty 2
  Filled 2023-02-26: qty 1
  Filled 2023-02-26: qty 2
  Filled 2023-02-26 (×2): qty 1

## 2023-02-26 MED ORDER — METOPROLOL TARTRATE 12.5 MG HALF TABLET
12.5000 mg | ORAL_TABLET | Freq: Two times a day (BID) | ORAL | Status: DC
  Administered 2023-02-27 – 2023-03-01 (×4): 12.5 mg via ORAL
  Filled 2023-02-26 (×5): qty 1

## 2023-02-26 MED ORDER — EPINEPHRINE HCL 5 MG/250ML IV SOLN IN NS: 0.5000 ug/min | INTRAVENOUS | Status: DC

## 2023-02-26 MED ORDER — SODIUM CHLORIDE 0.9 % IV SOLN: 250.0000 mL | INTRAVENOUS | Status: DC

## 2023-02-26 MED ORDER — DEXTROSE 50 % IV SOLN: 0.0000 mL | INTRAVENOUS | Status: DC | PRN

## 2023-02-26 MED ORDER — CHLORHEXIDINE GLUCONATE CLOTH 2 % EX PADS
6.0000 | MEDICATED_PAD | Freq: Every day | CUTANEOUS | Status: DC
  Administered 2023-02-26 – 2023-02-28 (×3): 6 via TOPICAL

## 2023-02-26 MED ORDER — TRAMADOL HCL 50 MG PO TABS: 50.0000 mg | ORAL_TABLET | ORAL | Status: DC | PRN

## 2023-02-26 MED ORDER — CEFAZOLIN SODIUM-DEXTROSE 2-4 GM/100ML-% IV SOLN
2.0000 g | Freq: Three times a day (TID) | INTRAVENOUS | Status: AC
  Administered 2023-02-26 – 2023-02-28 (×6): 2 g via INTRAVENOUS
  Filled 2023-02-26 (×6): qty 100

## 2023-02-26 MED ORDER — INSULIN REGULAR(HUMAN) IN NACL 100-0.9 UT/100ML-% IV SOLN: INTRAVENOUS | Status: DC

## 2023-02-26 MED ORDER — PROTAMINE SULFATE 10 MG/ML IV SOLN
INTRAVENOUS | Status: DC | PRN
  Administered 2023-02-26: 10 mg via INTRAVENOUS
  Administered 2023-02-26: 300 mg via INTRAVENOUS

## 2023-02-26 MED ORDER — PHENYLEPHRINE HCL-NACL 20-0.9 MG/250ML-% IV SOLN
0.0000 ug/min | INTRAVENOUS | Status: DC
  Administered 2023-02-27: 20 ug/min via INTRAVENOUS
  Filled 2023-02-26: qty 250

## 2023-02-26 MED ORDER — SODIUM CHLORIDE 0.9 % IV SOLN: INTRAVENOUS | Status: DC | PRN

## 2023-02-26 MED ORDER — VANCOMYCIN HCL IN DEXTROSE 1-5 GM/200ML-% IV SOLN
1000.0000 mg | Freq: Once | INTRAVENOUS | Status: AC
  Administered 2023-02-26: 1000 mg via INTRAVENOUS
  Filled 2023-02-26: qty 200

## 2023-02-26 MED ORDER — METOPROLOL TARTRATE 12.5 MG HALF TABLET: 12.5000 mg | ORAL_TABLET | Freq: Once | ORAL | Status: DC

## 2023-02-26 MED ORDER — SODIUM CHLORIDE 0.45 % IV SOLN: INTRAVENOUS | Status: DC | PRN

## 2023-02-26 MED ORDER — ~~LOC~~ CARDIAC SURGERY, PATIENT & FAMILY EDUCATION
Freq: Once | Status: DC
  Filled 2023-02-26: qty 1

## 2023-02-26 MED ORDER — STERILE WATER FOR IRRIGATION IR SOLN
Status: DC | PRN
  Administered 2023-02-26: 2000 mL

## 2023-02-26 MED ORDER — HEPARIN SODIUM (PORCINE) 1000 UNIT/ML IJ SOLN
INTRAMUSCULAR | Status: DC | PRN
  Administered 2023-02-26: 35000 [IU] via INTRAVENOUS

## 2023-02-26 MED ORDER — MIDAZOLAM HCL 2 MG/2ML IJ SOLN
2.0000 mg | INTRAMUSCULAR | Status: DC | PRN
  Administered 2023-02-26: 2 mg via INTRAVENOUS
  Filled 2023-02-26: qty 2

## 2023-02-26 MED ORDER — LACTATED RINGERS IV SOLN: INTRAVENOUS | Status: DC | PRN

## 2023-02-26 MED ORDER — PROPOFOL 10 MG/ML IV BOLUS
INTRAVENOUS | Status: AC
  Filled 2023-02-26: qty 20

## 2023-02-26 MED ORDER — CHLORHEXIDINE GLUCONATE 0.12 % MT SOLN
15.0000 mL | OROMUCOSAL | Status: AC
  Administered 2023-02-26: 15 mL via OROMUCOSAL
  Filled 2023-02-26: qty 15

## 2023-02-26 MED ORDER — PROTAMINE SULFATE 10 MG/ML IV SOLN
INTRAVENOUS | Status: AC
  Filled 2023-02-26: qty 5

## 2023-02-26 MED ORDER — MORPHINE SULFATE (PF) 2 MG/ML IV SOLN
1.0000 mg | INTRAVENOUS | Status: DC | PRN
  Administered 2023-02-26 (×2): 2 mg via INTRAVENOUS
  Administered 2023-02-27: 4 mg via INTRAVENOUS
  Filled 2023-02-26 (×2): qty 1
  Filled 2023-02-26: qty 2

## 2023-02-26 MED ORDER — POTASSIUM CHLORIDE 10 MEQ/50ML IV SOLN: 10.0000 meq | INTRAVENOUS | Status: AC

## 2023-02-26 MED ORDER — ROSUVASTATIN CALCIUM 20 MG PO TABS
40.0000 mg | ORAL_TABLET | Freq: Every day | ORAL | Status: DC
  Administered 2023-02-27 – 2023-03-03 (×5): 40 mg via ORAL
  Filled 2023-02-26 (×6): qty 2

## 2023-02-26 MED ORDER — SODIUM CHLORIDE 0.9 % IV SOLN: INTRAVENOUS | Status: DC

## 2023-02-26 MED ORDER — MIDAZOLAM HCL 5 MG/5ML IJ SOLN
INTRAMUSCULAR | Status: DC | PRN
  Administered 2023-02-26 (×3): 1 mg via INTRAVENOUS
  Administered 2023-02-26: 3.5 mg via INTRAVENOUS
  Administered 2023-02-26: .5 mg via INTRAVENOUS

## 2023-02-26 MED ORDER — METOCLOPRAMIDE HCL 5 MG/ML IJ SOLN
10.0000 mg | Freq: Four times a day (QID) | INTRAMUSCULAR | Status: AC
  Administered 2023-02-26 – 2023-02-27 (×6): 10 mg via INTRAVENOUS
  Filled 2023-02-26 (×6): qty 2

## 2023-02-26 MED ORDER — ASPIRIN 325 MG PO TBEC: 325.0000 mg | DELAYED_RELEASE_TABLET | Freq: Every day | ORAL | Status: DC

## 2023-02-26 MED ORDER — SODIUM CHLORIDE 0.9% FLUSH: 10.0000 mL | INTRAVENOUS | Status: DC | PRN

## 2023-02-26 MED ORDER — LIDOCAINE 2% (20 MG/ML) 5 ML SYRINGE
INTRAMUSCULAR | Status: AC
  Filled 2023-02-26: qty 5

## 2023-02-26 MED ORDER — MAGNESIUM SULFATE 4 GM/100ML IV SOLN
4.0000 g | Freq: Once | INTRAVENOUS | Status: AC
  Administered 2023-02-26: 4 g via INTRAVENOUS
  Filled 2023-02-26 (×2): qty 100

## 2023-02-26 MED ORDER — PROTAMINE SULFATE 10 MG/ML IV SOLN
INTRAVENOUS | Status: AC
  Filled 2023-02-26: qty 25

## 2023-02-26 MED ORDER — DOCUSATE SODIUM 100 MG PO CAPS
200.0000 mg | ORAL_CAPSULE | Freq: Every day | ORAL | Status: DC
  Administered 2023-02-27 – 2023-03-01 (×3): 200 mg via ORAL
  Filled 2023-02-26 (×3): qty 2

## 2023-02-26 MED ORDER — BISACODYL 5 MG PO TBEC
10.0000 mg | DELAYED_RELEASE_TABLET | Freq: Every day | ORAL | Status: DC
  Administered 2023-02-27 – 2023-03-01 (×3): 10 mg via ORAL
  Filled 2023-02-26 (×3): qty 2

## 2023-02-26 MED ORDER — ACETAMINOPHEN 650 MG RE SUPP
650.0000 mg | Freq: Once | RECTAL | Status: AC
  Administered 2023-02-26: 650 mg via RECTAL

## 2023-02-26 MED ORDER — CHLORHEXIDINE GLUCONATE 4 % EX SOLN: 30.0000 mL | CUTANEOUS | Status: DC

## 2023-02-26 MED ORDER — ACETAMINOPHEN 160 MG/5ML PO SOLN: 1000.0000 mg | Freq: Four times a day (QID) | ORAL | Status: DC

## 2023-02-26 MED ORDER — LEVOFLOXACIN 750 MG PO TABS: 750.0000 mg | ORAL_TABLET | Freq: Every day | ORAL | Status: DC

## 2023-02-26 MED ORDER — SODIUM CHLORIDE 0.9% FLUSH
10.0000 mL | Freq: Two times a day (BID) | INTRAVENOUS | Status: DC
  Administered 2023-02-26: 20 mL
  Administered 2023-02-27 – 2023-03-03 (×6): 10 mL

## 2023-02-26 MED ORDER — METOPROLOL TARTRATE 25 MG/10 ML ORAL SUSPENSION
12.5000 mg | Freq: Two times a day (BID) | ORAL | Status: DC
  Filled 2023-02-26 (×2): qty 5

## 2023-02-26 MED ORDER — ACETAMINOPHEN 500 MG PO TABS
1000.0000 mg | ORAL_TABLET | Freq: Four times a day (QID) | ORAL | Status: DC
  Administered 2023-02-27 – 2023-03-03 (×17): 1000 mg via ORAL
  Filled 2023-02-26 (×18): qty 2

## 2023-02-26 MED ORDER — SODIUM CHLORIDE 0.9% FLUSH
3.0000 mL | Freq: Two times a day (BID) | INTRAVENOUS | Status: DC
  Administered 2023-02-27 (×2): 3 mL via INTRAVENOUS

## 2023-02-26 MED ORDER — ROCURONIUM BROMIDE 10 MG/ML (PF) SYRINGE
PREFILLED_SYRINGE | INTRAVENOUS | Status: DC | PRN
  Administered 2023-02-26: 50 mg via INTRAVENOUS
  Administered 2023-02-26: 10 mg via INTRAVENOUS
  Administered 2023-02-26: 100 mg via INTRAVENOUS
  Administered 2023-02-26 (×2): 50 mg via INTRAVENOUS

## 2023-02-26 SURGICAL SUPPLY — 99 items
ADH SKN CLS APL DERMABOND .7 (GAUZE/BANDAGES/DRESSINGS) ×2
ANTIFOG SOL W/FOAM PAD STRL (MISCELLANEOUS) ×2
ATRICLIP EXCLUSION VLAA 45 (Miscellaneous) ×2 IMPLANT
BAG DECANTER FOR FLEXI CONT (MISCELLANEOUS) ×2 IMPLANT
BLADE CLIPPER SURG (BLADE) ×2 IMPLANT
BLADE STERNUM SYSTEM 6 (BLADE) ×2 IMPLANT
BLADE SURG 11 STRL SS (BLADE) IMPLANT
BNDG CMPR MED 10X6 ELC LF (GAUZE/BANDAGES/DRESSINGS) ×2
BNDG CMPR STD VLCR NS LF 5.8X4 (GAUZE/BANDAGES/DRESSINGS) ×2
BNDG ELASTIC 4X5.8 VLCR NS LF (GAUZE/BANDAGES/DRESSINGS) IMPLANT
BNDG ELASTIC 6X10 VLCR STRL LF (GAUZE/BANDAGES/DRESSINGS) IMPLANT
BNDG GAUZE DERMACEA FLUFF 4 (GAUZE/BANDAGES/DRESSINGS) ×2 IMPLANT
BNDG GZE DERMACEA 4 6PLY (GAUZE/BANDAGES/DRESSINGS) ×2
CABLE SURGICAL S-101-97-12 (CABLE) ×2 IMPLANT
CANISTER SUCT 3000ML PPV (MISCELLANEOUS) ×2 IMPLANT
CANNULA MC2 2 STG 29/37 NON-V (CANNULA) ×2 IMPLANT
CANNULA NON VENT 20FR 12 (CANNULA) ×2 IMPLANT
CANNULA NON VENT 22FR 12 (CANNULA) IMPLANT
CANNULA VESSEL 3MM BLUNT TIP (CANNULA) IMPLANT
CATH ROBINSON RED A/P 18FR (CATHETERS) ×4 IMPLANT
CLIP RETRACTION 3.0MM CORONARY (MISCELLANEOUS) IMPLANT
CLIP TI MEDIUM 24 (CLIP) IMPLANT
CLIP TI WIDE RED SMALL 24 (CLIP) IMPLANT
CONN ST 1/2X1/2 BEN (MISCELLANEOUS) ×2 IMPLANT
CONNECTOR BLAKE 2:1 CARIO BLK (MISCELLANEOUS) ×2 IMPLANT
CONTAINER PROTECT SURGISLUSH (MISCELLANEOUS) ×4 IMPLANT
DERMABOND ADVANCED .7 DNX12 (GAUZE/BANDAGES/DRESSINGS) IMPLANT
DEVICE EXCLUSIN ATRCLP VLAA 45 (Miscellaneous) IMPLANT
DRAIN CHANNEL 19F RND (DRAIN) ×6 IMPLANT
DRAIN CONNECTOR BLAKE 1:1 (MISCELLANEOUS) ×2 IMPLANT
DRAPE CARDIOVASCULAR INCISE (DRAPES) ×2
DRAPE HALF SHEET 40X57 (DRAPES) IMPLANT
DRAPE SRG 135X102X78XABS (DRAPES) ×2 IMPLANT
DRAPE WARM FLUID 44X44 (DRAPES) ×2 IMPLANT
DRSG AQUACEL AG ADV 3.5X10 (GAUZE/BANDAGES/DRESSINGS) ×2 IMPLANT
DRSG AQUACEL AG ADV 3.5X14 (GAUZE/BANDAGES/DRESSINGS) IMPLANT
ELECT BLADE 4.0 EZ CLEAN MEGAD (MISCELLANEOUS) ×2
ELECT REM PT RETURN 9FT ADLT (ELECTROSURGICAL) ×2
ELECTRODE BLDE 4.0 EZ CLN MEGD (MISCELLANEOUS) ×2 IMPLANT
ELECTRODE REM PT RTRN 9FT ADLT (ELECTROSURGICAL) ×4 IMPLANT
FELT TEFLON 1X6 (MISCELLANEOUS) ×4 IMPLANT
GAUZE 4X4 16PLY ~~LOC~~+RFID DBL (SPONGE) ×2 IMPLANT
GAUZE SPONGE 4X4 12PLY STRL (GAUZE/BANDAGES/DRESSINGS) ×4 IMPLANT
GAUZE SPONGE 4X4 12PLY STRL LF (GAUZE/BANDAGES/DRESSINGS) IMPLANT
GLOVE BIO SURGEON STRL SZ 6 (GLOVE) IMPLANT
GLOVE BIO SURGEON STRL SZ7 (GLOVE) ×4 IMPLANT
GLOVE BIOGEL M STRL SZ7.5 (GLOVE) ×4 IMPLANT
GLOVE BIOGEL PI IND STRL 9 (GLOVE) IMPLANT
GLOVE SS BIOGEL STRL SZ 6 (GLOVE) IMPLANT
GLOVE SURG SS PI 6.5 STRL IVOR (GLOVE) IMPLANT
GLOVE SURG SS PI 7.0 STRL IVOR (GLOVE) IMPLANT
GOWN STRL REUS W/ TWL LRG LVL3 (GOWN DISPOSABLE) ×8 IMPLANT
GOWN STRL REUS W/ TWL XL LVL3 (GOWN DISPOSABLE) ×4 IMPLANT
GOWN STRL REUS W/TWL LRG LVL3 (GOWN DISPOSABLE) ×16
GOWN STRL REUS W/TWL XL LVL3 (GOWN DISPOSABLE) ×2
HEMOSTAT POWDER SURGIFOAM 1G (HEMOSTASIS) ×4 IMPLANT
INSERT SUTURE HOLDER (MISCELLANEOUS) ×2 IMPLANT
KIT BASIN OR (CUSTOM PROCEDURE TRAY) ×2 IMPLANT
KIT SUCTION CATH 14FR (SUCTIONS) ×2 IMPLANT
KIT TURNOVER KIT B (KITS) ×2 IMPLANT
KIT VASOVIEW HEMOPRO 2 VH 4000 (KITS) ×2 IMPLANT
LEAD PACING MYOCARDI (MISCELLANEOUS) ×2 IMPLANT
MARKER GRAFT CORONARY BYPASS (MISCELLANEOUS) ×6 IMPLANT
NS IRRIG 1000ML POUR BTL (IV SOLUTION) ×10 IMPLANT
PACK E OPEN HEART (SUTURE) ×2 IMPLANT
PACK OPEN HEART (CUSTOM PROCEDURE TRAY) ×2 IMPLANT
PAD ARMBOARD 7.5X6 YLW CONV (MISCELLANEOUS) ×4 IMPLANT
PAD ELECT DEFIB RADIOL ZOLL (MISCELLANEOUS) ×2 IMPLANT
PENCIL BUTTON HOLSTER BLD 10FT (ELECTRODE) ×2 IMPLANT
POSITIONER HEAD DONUT 9IN (MISCELLANEOUS) ×2 IMPLANT
PUNCH AORTIC ROTATE 4.0MM (MISCELLANEOUS) ×2 IMPLANT
SET MPS 3-ND DEL (MISCELLANEOUS) IMPLANT
SOLUTION ANTFG W/FOAM PAD STRL (MISCELLANEOUS) IMPLANT
SPONGE T-LAP 18X18 ~~LOC~~+RFID (SPONGE) ×8 IMPLANT
SUPPORT HEART JANKE-BARRON (MISCELLANEOUS) ×2 IMPLANT
SUT BONE WAX W31G (SUTURE) ×2 IMPLANT
SUT ETHIBOND X763 2 0 SH 1 (SUTURE) ×4 IMPLANT
SUT MNCRL AB 3-0 PS2 18 (SUTURE) ×4 IMPLANT
SUT MNCRL AB 4-0 PS2 18 (SUTURE) IMPLANT
SUT PDS AB 1 CTX 36 (SUTURE) ×4 IMPLANT
SUT PROLENE 4 0 SH DA (SUTURE) ×2 IMPLANT
SUT PROLENE 5 0 C 1 36 (SUTURE) ×6 IMPLANT
SUT PROLENE 7 0 BV 1 (SUTURE) IMPLANT
SUT PROLENE 7 0 BV1 MDA (SUTURE) ×2 IMPLANT
SUT STEEL 6MS V (SUTURE) ×4 IMPLANT
SUT STEEL STERNAL CCS#1 18IN (SUTURE) IMPLANT
SUT VIC AB 2-0 CT1 27 (SUTURE) ×2
SUT VIC AB 2-0 CT1 TAPERPNT 27 (SUTURE) IMPLANT
SUT VIC AB 2-0 CTX 36 (SUTURE) IMPLANT
SYSTEM SAHARA CHEST DRAIN ATS (WOUND CARE) ×2 IMPLANT
TAPE CLOTH SURG 4X10 WHT LF (GAUZE/BANDAGES/DRESSINGS) IMPLANT
TAPE PAPER 2X10 WHT MICROPORE (GAUZE/BANDAGES/DRESSINGS) IMPLANT
TOWEL GREEN STERILE (TOWEL DISPOSABLE) ×2 IMPLANT
TOWEL GREEN STERILE FF (TOWEL DISPOSABLE) ×2 IMPLANT
TRAY FOLEY SLVR 16FR TEMP STAT (SET/KITS/TRAYS/PACK) ×2 IMPLANT
TUBE SUCTION CARDIAC 10FR (CANNULA) IMPLANT
TUBING LAP HI FLOW INSUFFLATIO (TUBING) ×2 IMPLANT
UNDERPAD 30X36 HEAVY ABSORB (UNDERPADS AND DIAPERS) ×2 IMPLANT
WATER STERILE IRR 1000ML POUR (IV SOLUTION) ×4 IMPLANT

## 2023-02-26 NOTE — Anesthesia Procedure Notes (Signed)
Arterial Line Insertion Start/End6/17/2024 7:12 AM, 02/26/2023 7:15 AM Performed by: Lonia Mad, CRNA, CRNA  Preanesthetic checklist: patient identified, IV checked, site marked, risks and benefits discussed, surgical consent, monitors and equipment checked, pre-op evaluation, timeout performed and anesthesia consent Lidocaine 1% used for infiltration and patient sedated Left, radial was placed Catheter size: 20 G Hand hygiene performed , maximum sterile barriers used  and Seldinger technique used Allen's test indicative of satisfactory collateral circulation Attempts: 1 Procedure performed without using ultrasound guided technique. Following insertion, dressing applied and Biopatch. Post procedure assessment: normal  Patient tolerated the procedure well with no immediate complications.

## 2023-02-26 NOTE — Op Note (Signed)
[address]       [place]obby [place] [id number]             [phone]                                          02/26/2023 Patient:  Bobby Bradley Pre-Op Dx: 3V CAD HTN Hx of Atrial fibrillation Hx of ICD placement CHF DM Post-op Dx:  same Procedure: CABG X 4.  LIMA LAD, RSVG Diag, OM, PDA   Endoscopic greater saphenous vein harvest on the right Placement of 45mm Atriclip   Surgeon and Role:      * Mcadoo Muzquiz, Eliezer Lofts, MD - Primary    * B. Stehler , PA-C - assisting An experienced assistant was required given the complexity of this surgery and the standard of surgical care. The assistant was needed for exposure, dissection, suctioning, retraction of delicate tissues and sutures, instrument exchange and for overall help during this procedure.    Anesthesia  general EBL:  Blood Administration: none Xclamp Time:  61 min Pump Time:   Drains: 44 F blake drain:  R, L, mediastinal  Wires: ventricular Counts: correct   Indications: 77 year old male with three-vessel coronary artery disease, and history of paroxysmal atrial fibrillation.  Most recent EKG shows that he is in sinus rhythm.  His echocardiogram shows preserved biventricular function.  We reviewed his left heart catheterization he has significant disease, but is good targets in his PDA, 2 targets on his lateral wall, and his LAD.  In addition to surgical revascularization, I will also performed an atrial clip.  The risks and benefits have been discussed.  He is agreeable to proceed.  Findings: Good LIMA and vein, good targets.  Improved function post bypass  Operative Technique: All invasive lines were placed in pre-op holding.  After the risks, benefits and alternatives were thoroughly discussed, the patient was brought to the operative theatre.  Anesthesia was induced, and the patient was prepped and draped in normal sterile fashion.  An appropriate surgical pause was performed, and  pre-operative antibiotics were dosed accordingly.  We began with simultaneous incisions along the right leg for harvesting of the greater saphenous vein and the chest for the sternotomy.  In regards to the sternotomy, this was carried down with bovie cautery, and the sternum was divided with a reciprocating saw.  Meticulous hemostasis was obtained.  The left internal thoracic artery was exposed and harvested in in pedicled fashion.  The patient was systemically heparinized, and the artery was divided distally, and placed in a papaverine sponge.    The sternal elevator was removed, and a retractor was placed.  The pericardium was divided in the midline and fashioned into a cradle with pericardial stitches.   After we confirmed an appropriate ACT, the ascending aorta was cannulated in standard fashion.  The right atrial appendage was used for venous cannulation site.  Cardiopulmonary bypass was initiated, and the heart retractor was placed. The cross clamp was applied, and a dose of anterograde cardioplegia was given with good arrest of the heart.  We moved to the posterior wall of the heart, and found a good target on the PDA.  An arteriotomy was made, and the vein graft was anastomosed to it in an end to side fashion.  Next we exposed the lateral wall, and found a good target on  the OM.  An end to side anastomosis with the vein graft was then created.  Next, we exposed the anterior wall of the heart and identified a good target on Diagonal.   An arteriotomy was created.  The vein was anastomosed in an end to side fashion.  The base of the left atrial appendage was measured, and a 45mm clip was placed.  Finally, we exposed a good target on the  LAD, and fashioned an end to side anastomosis between it and the LITA.  We began to re-warm, and a re-animation dose of cardioplegia was given.  The heart was de-aired, and the cross clamp was removed.  Meticulous hemostasis was obtained.    A partial occludding clamp  was then placed on the ascending aorta, and we created an end to side anastomosis between it and the proximal vein grafts.  Rings were placed on the proximal anastomosis.  Hemostasis was obtained, and we separated from cardiopulmonary bypass without event.  The heparin was reversed with protamine.  Chest tubes and wires were placed, and the sternum was re-approximated with sternal wires.  The soft tissue and skin were re-approximated wth absorbable suture.    The patient tolerated the procedure without any immediate complications, and was transferred to the ICU in guarded condition.  Bobby Bradley

## 2023-02-26 NOTE — Consult Note (Signed)
NAME:  Bobby Bradley, MRN:  [id number], DOB:  Jun 11, 1946, LOS: 0 ADMISSION DATE:  02/26/2023, CONSULTATION DATE:  6/17 REFERRING MD:  Cliffton Asters, CHIEF COMPLAINT:  post cardiac surgery critical care support    History of Present Illness:  77 year old male who was referred to cardiothoracic surgery for severe 3 V CAD w/ progressive dyspnea and PET scan eval showed  large reversible defect in apical inferior, inferolateral, and anterolateral segments w/ EF down from 32 to 29% w/ exertion.  He underwent 3V CABG by Dr Cliffton Asters on 6/17  Cardiac bypass initiated at 1034; removed at 1209 PCCM asked to assist w/ post op management   Pertinent  Medical History  pAF CHA2DS2-VASc Score = 7 on DOAC at home, CAD, ICD, CM w/ HFrEF, HTN, prior TIA, DM, HL, prostate cancer,  Significant Hospital Events: Including procedures, antibiotic start and stop dates in addition to other pertinent events   6/17 CABG x 4 utilizing LIMA to LAD, SVG to OM, SVG to PDA, and SVG to Diagonal as well as endoscopic harvest of the right greater saphenous vein and clipping of the left atrial appendage   Interim History / Subjective:  Sedated  Objective   Blood pressure (Abnormal) 113/51, pulse 68, temperature 98.9 F (37.2 C), temperature source Oral, resp. rate 18, height 6' (1.829 m), weight 109.8 kg.        Intake/Output Summary (Last 24 hours) at 02/26/2023 1114 Last data filed at 02/26/2023 1107 Gross per 24 hour  Intake 1600 ml  Output 250 ml  Net 1350 ml   Filed Weights   02/26/23 0548  Weight: 109.8 kg    Examination: General: 77 year old male sedated and on vent post-op HENT: orally intubated. Right internal jugular CVL dressing CD&I Lungs: clear w/ equal chest rise Cardiovascular: RRR. Sternal dressing CD&I. Chest tubes w/ min bloody output Abdomen: soft Extremities: warm no edema RLE graft site wrapped Neuro: sedated GU: clear yellow via [facility]  Resolved Hospital Problem list     Assessment &  Plan:  3V CAD w/ HFrEF (29-32%), S/p 4 V CABG 6/17 Plan Wean epi for CI > 2.0 Wean phenylephrine for for MAP > 70 and SBP > 90 PRN albumin for MAP <70  Wean inotropes for CI > 2 Keep euvolemic Post op ASA as directed by surgical team (starting tonight)  Post op statin starting tonight Tight glycemic control PRN analgesia  Tele  F/u post op labs Tubes/drains as directed by surgical team   H/o pAF Plan Rate control Resume AC when ok w/ surg Tele   Need for mechanical ventilation Plan Vent wean protocol  VAP bundle  Precedex infusion   Post op anemia Plan Monitor  Transfuse as indicated  Type II DM Plan Ssi   Best Practice (right click and "Reselect all SmartList Selections" daily)   Diet/type: NPO DVT prophylaxis: other per surgical team  GI prophylaxis: PPI Lines: Central line Foley:  Yes, and it is still needed Code Status:  full code Last date of multidisciplinary goals of care discussion [per primary ]  Labs   CBC: Recent Labs  Lab 02/22/23 1113 02/26/23 0816 02/26/23 1002 02/26/23 1035 02/26/23 1041  WBC 9.2  --   --   --   --   HGB 14.6 11.9* 10.9* 9.2* 8.8*  HCT 42.8 35.0* 32.0* 27.0* 26.0*  MCV 88.2  --   --   --   --   PLT 235  --   --   --   --  Basic Metabolic Panel: Recent Labs  Lab 02/22/23 1113 02/26/23 0816 02/26/23 1002 02/26/23 1035 02/26/23 1041  NA 137 138 139 135 137  K 4.3 4.5 4.0 4.2 5.5*  CL 104 103 106  --   --   CO2 20*  --   --   --   --   GLUCOSE 136* 160* 133*  --   --   BUN 13 21 19   --   --   CREATININE 1.03 1.10 1.00  --   --   CALCIUM 9.1  --   --   --   --    GFR: Estimated Creatinine Clearance: 81.7 mL/min (by C-G formula based on SCr of 1 mg/dL). Recent Labs  Lab 02/22/23 1113  WBC 9.2    Liver Function Tests: Recent Labs  Lab 02/22/23 1113  AST 21  ALT 25  ALKPHOS 79  BILITOT 0.8  PROT 7.2  ALBUMIN 3.9   No results for input(s): "LIPASE", "AMYLASE" in the last 168 hours. No results  for input(s): "AMMONIA" in the last 168 hours.  ABG    Component Value Date/Time   PHART 7.378 02/26/2023 1035   PCO2ART 40.8 02/26/2023 1035   PO2ART 318 (H) 02/26/2023 1035   HCO3 24.3 02/26/2023 1041   TCO2 26 02/26/2023 1041   ACIDBASEDEF 1.0 02/26/2023 1041   O2SAT 84 02/26/2023 1041     Coagulation Profile: Recent Labs  Lab 02/22/23 1113  INR 1.3*    Cardiac Enzymes: No results for input(s): "CKTOTAL", "CKMB", "CKMBINDEX", "TROPONINI" in the last 168 hours.  HbA1C: Hgb A1c MFr Bld  Date/Time Value Ref Range Status  02/22/2023 11:13 AM 6.5 (H) 4.8 - 5.6 % Final    Comment:    (NOTE) Pre diabetes:          5.7%-6.4%  Diabetes:              >6.4%  Glycemic control for   <7.0% adults with diabetes   11/13/2022 01:45 PM 6.9 (H) 4.6 - 6.5 % Final    Comment:    Glycemic Control Guidelines for People with Diabetes:Non Diabetic:  <6%Goal of Therapy: <7%Additional Action Suggested:  >8%     CBG: Recent Labs  Lab 02/22/23 1037 02/26/23 0557  GLUCAP 133* 191*    Review of Systems:   Not able   Past Medical History:  He,  has a past medical history of A-fib (HCC), AICD (automatic cardioverter/defibrillator) present, CHF (congestive heart failure) (HCC), Colon polyps, Diabetes mellitus without complication (HCC), Dysrhythmia, Essential hypertension, Hyperlipidemia, Non-ischemic cardiomyopathy (HCC), Presence of combination internal cardiac defibrillator (ICD) and pacemaker, Presence of permanent cardiac pacemaker, Prostate cancer (HCC), Stroke (HCC), and TIA (transient ischemic attack).   Surgical History:   Past Surgical History:  Procedure Laterality Date   BACK SURGERY     CARDIAC CATHETERIZATION     COLONOSCOPY     about 10 years. [place] in [place] or [place]    ICD IMPLANT  2012   PROSTATECTOMY     RIGHT/LEFT HEART CATH AND CORONARY ANGIOGRAPHY N/A 02/12/2023   Procedure: RIGHT/LEFT HEART CATH AND CORONARY ANGIOGRAPHY;  Surgeon:  Kathleene Hazel, MD;  Location: [facility] INVASIVE CV LAB;  Service: Cardiovascular;  Laterality: N/A;     Social History:   reports that he quit smoking about 39 years ago. His smoking use included cigarettes. He has never been exposed to tobacco smoke. He has never used smokeless tobacco. He reports current alcohol use. He reports that  he does not use drugs.   Family History:  His family history includes Atrial fibrillation in his sister; Lung cancer in his brother, father, and mother; Uterine cancer in his mother. There is no history of Colon cancer, Esophageal cancer, Pancreatic cancer, Liver disease, Stomach cancer, or Rectal cancer.   Allergies Allergies  Allergen Reactions   Lisinopril     Extreme diarrhea     Home Medications  Prior to Admission medications   Medication Sig Start Date End Date Taking? Authorizing Provider  amLODipine (NORVASC) 10 MG tablet TAKE 1 TABLET(10 MG) BY MOUTH DAILY 12/18/22  Yes Lewayne Bunting, MD  aspirin EC 81 MG tablet Take 81 mg by mouth daily. Swallow whole.   Yes [provider]  carvedilol (COREG) 25 MG tablet TAKE 1 TABLET(25 MG) BY MOUTH TWICE DAILY 02/01/23  Yes Crenshaw, Madolyn Frieze, MD  cetirizine (ZYRTEC) 10 MG tablet Take 10 mg by mouth daily.   Yes [provider]  ferrous sulfate 325 (65 FE) MG tablet Take 1 tablet (325 mg total) by mouth daily with breakfast. 12/26/22  Yes Armbruster, Willaim Rayas, MD  levofloxacin (LEVAQUIN) 750 MG tablet Take 750 mg by mouth daily.   Yes [provider]  losartan (COZAAR) 100 MG tablet TAKE 1 TABLET(100 MG) BY MOUTH DAILY 12/18/22  Yes Crenshaw, Madolyn Frieze, MD  Multiple Vitamin (MULTI VITAMIN DAILY PO) Take 1 tablet by mouth daily.   Yes [provider]  rosuvastatin (CRESTOR) 40 MG tablet Take 1 tablet (40 mg total) by mouth daily. 11/28/22  Yes Copland, Gwenlyn Found, MD  spironolactone (ALDACTONE) 25 MG tablet Take 1 tablet (25 mg total) by mouth daily. 12/18/22  Yes Lewayne Bunting, MD  apixaban (ELIQUIS) 5 MG TABS tablet TAKE 1 TABLET(5 MG) BY MOUTH TWICE DAILY 11/07/22   Lewayne Bunting, MD  metFORMIN (GLUCOPHAGE) 500 MG tablet TAKE 1 TABLET(500 MG) BY MOUTH TWICE DAILY WITH A MEAL 09/21/22   Copland, Gwenlyn Found, MD     Critical care time: 32 min       Simonne Martinet ACNP-BC [facility] Pulmonary/Critical Care Pager # [phone] OR # [phone] if no answer

## 2023-02-26 NOTE — Interval H&P Note (Signed)
History and Physical Interval Note:  02/26/2023 7:33 AM  Bobby Bradley  has presented today for surgery, with the diagnosis of CAD AFIB.  The various methods of treatment have been discussed with the patient and family. After consideration of risks, benefits and other options for treatment, the patient has consented to  Procedure(s): CORONARY ARTERY BYPASS GRAFTING (CABG) (N/A) CLIPPING OF ATRIAL APPENDAGE (N/A) TRANSESOPHAGEAL ECHOCARDIOGRAM (N/A) as a surgical intervention.  The patient's history has been reviewed, patient examined, no change in status, stable for surgery.  I have reviewed the patient's chart and labs.  Questions were answered to the patient's satisfaction.     Adrian Dinovo Keane Scrape

## 2023-02-26 NOTE — Hospital Course (Addendum)
History of Present Illness:     Bobby Bradley is a 77 year old male presents for surgical evaluation of three-vessel coronary artery disease. He has a history of permanent pacemaker placement, atrial fibrillation, and hypertension. He also has a history of diabetes mellitus, and has had TIAs in the past. He has had progressive exertional dyspnea and is unable to walk short distances before experiencing symptoms. He denies any chest pain or lower extremity swelling.  He denies any palpitations. Echocardiogram December 2023 showed normal LV function, grade I diastolic dysfunction, and moderate left atrial enlargement. Calcium score March 2024 was in the 75th percentile and in May 2024 PET scan showed a large reversible defect in the apical inferior, inferolateral, anterolateral segment with ejection fraction dropping from 32%-29% with exercise. Cardiac catheterization on 02/12/23 showed severe 3 vessel disease involving 90% stenosis of the RCA, 99% stenosis of the circumflex, 99% stenosis of the LAD, 80% stenosis of the diagonal and 50% stenosis of the left main. Echocardiogram on 02/12/23 showed LVEF 35-40% with mild asymmetric left ventricular hypertrophy and grade I diastolic dysfunction.  Dr. Cliffton Asters reviewed the patient's diagnostic studies and determined he would benefit from surgical intervention. He reviewed the treatment options as well as risks and benefits of surgery with the patient. Bobby Bradley was agreeable to proceed with surgery.  Hospital Course: Bobby Bradley presented to [facility] and was brought to the operating room on 02/26/23. He underwent CABG x 4 utilizing LIMA to LAD, SVG to OM, SVG to PDA, and SVG to Diagonal as well as endoscopic harvest of the right greater saphenous vein and clipping of the left atrial appendage using a 45mm Atriclip. He tolerated the procedure well and was transferred to the SICU in stable condition. He was extubated the evening of surgery without  complication. He required some Neo-synephrine for a soft blood pressure, drips were weaned as hemodynamics tolerated. Flotrac, arterial line and epicardial pacing wires were removed without complication. He was restarted on home Eliquis for his history of stroke and atrial fibrillation. Foley catheter was removed without complication, external catheter was placed due to history of incontinence. Low dose Lopressor was started. He remained on 3L [place] Oxygen, this was weaned as tolerated. CXR showed bibasilar atelectasis a left sided pleural effusion. He was volume overload and diuresed appropriately. Mucinex and flutter valve was added and pulmonary hygiene was encouraged. Chest tubes were removed without complication. Patient was felt stable for transfer to 4E. The patient converted to atrial fibrillation and was started on an Amiodarone drip. His blood pressure was too soft to titrate Lopressor. Home Metformin was restarted. He was transtioned to Home Coreg 12.5mg  BID and Home Spironolactone. He remained in rate controlled atrial fibrillation, as discussed with Dr. Cliffton Asters he was transitioned to PO Amiodarone. *** He remained in asymptomatic rate controlled atrial fibrillation. He was ambulating well on RA. PT and OT recommended home health PT/OT, this was arranged. His incisions were healing well without sign of infection. He was felt stable for discharge home.

## 2023-02-26 NOTE — Anesthesia Procedure Notes (Signed)
Central Venous Catheter Insertion Performed by: Eilene Ghazi, MD, anesthesiologist Start/End6/17/2024 7:02 AM, 02/26/2023 7:20 AM Patient location: Pre-op. Preanesthetic checklist: patient identified, IV checked, site marked, risks and benefits discussed, surgical consent, monitors and equipment checked, pre-op evaluation, timeout performed and anesthesia consent Position: Trendelenburg Lidocaine 1% used for infiltration and patient sedated Hand hygiene performed  and maximum sterile barriers used  Catheter size: 8.5 Fr Central line was placed.Sheath introducer Procedure performed using ultrasound guided technique. Ultrasound Notes:anatomy identified, needle tip was noted to be adjacent to the nerve/plexus identified, no ultrasound evidence of intravascular and/or intraneural injection and image(s) printed for medical record Attempts: 1 Following insertion, line sutured, dressing applied and Biopatch. Post procedure assessment: blood return through all ports, free fluid flow and no air  Patient tolerated the procedure well with no immediate complications. Additional procedure comments: Flo-trac.

## 2023-02-26 NOTE — Anesthesia Preprocedure Evaluation (Addendum)
Anesthesia Evaluation  Patient identified by MRN, date of birth, ID band Patient awake    Reviewed: Allergy & Precautions, H&P , NPO status , Patient's Chart, lab work & pertinent test results  Airway Mallampati: II  TM Distance: >3 FB Neck ROM: Full    Dental no notable dental hx.    Pulmonary former smoker   Pulmonary exam normal breath sounds clear to auscultation       Cardiovascular hypertension, Pt. on medications +CHF  Normal cardiovascular exam+ dysrhythmias Atrial Fibrillation + pacemaker + Cardiac Defibrillator  Rhythm:Regular Rate:Normal   1. Left ventricular ejection fraction, by estimation, is 35 to 40%. The  left ventricle has moderately decreased function. The left ventricle  demonstrates regional wall motion abnormalities (see scoring  diagram/findings for description). There is mild  asymmetric left ventricular hypertrophy of the basal-septal segment. Left  ventricular diastolic parameters are consistent with Grade I diastolic  dysfunction (impaired relaxation).   2. Right ventricular systolic function is normal. The right ventricular  size is normal.   3. The mitral valve is normal in structure. Trivial mitral valve  regurgitation. No evidence of mitral stenosis.   4. The aortic valve is tricuspid. There is mild calcification of the  aortic valve. Aortic valve regurgitation is not visualized. Aortic valve  sclerosis/calcification is present, without any evidence of aortic  stenosis.   5. Limited study due to poor sound wave transmission. There seems to be  hypokinesis of the inferior, inferolateral and lateral walls.     Neuro/Psych TIA negative psych ROS   GI/Hepatic negative GI ROS, Neg liver ROS,,,  Endo/Other  diabetes, Type 2    Renal/GU negative Renal ROS  negative genitourinary   Musculoskeletal negative musculoskeletal ROS (+)    Abdominal   Peds negative pediatric ROS (+)   Hematology negative hematology ROS (+)   Anesthesia Other Findings   Reproductive/Obstetrics negative OB ROS                             Anesthesia Physical Anesthesia Plan  ASA: 4  Anesthesia Plan: General   Post-op Pain Management:    Induction: Intravenous  PONV Risk Score and Plan: Ondansetron, Dexamethasone and Treatment may vary due to age or medical condition  Airway Management Planned: Oral ETT  Additional Equipment: Arterial line, CVP, PA Cath, TEE and Ultrasound Guidance Line Placement  Intra-op Plan:   Post-operative Plan: Post-operative intubation/ventilation  Informed Consent: I have reviewed the patients History and Physical, chart, labs and discussed the procedure including the risks, benefits and alternatives for the proposed anesthesia with the patient or authorized representative who has indicated his/her understanding and acceptance.     Dental advisory given  Plan Discussed with: CRNA and Surgeon  Anesthesia Plan Comments: (AICD to be inactivated for procedure. ZOLL pads will be placed prior to deactivation)        Anesthesia Quick Evaluation

## 2023-02-26 NOTE — Procedures (Signed)
Extubation Procedure Note  Patient Details:   Name: Aditya Szalkowski DOB: 1946-03-22 MRN: [id number]   Airway Documentation:    Vent end date: 02/26/23 Vent end time: 1815   Evaluation  O2 sats: stable throughout Complications: No apparent complications Patient did tolerate procedure well. Bilateral Breath Sounds: Clear, Diminished   Yes  Pt successfully extubated per rapid wean protocol with no complications after successfully passing NIF test -24 and VC test 1.1L, Pt speaking, on nasal cannula, able to state name and DOB. Audible cuff leak heard prior to extubation, no stridor heard at this time. RRT will be avail ible as needed.  Jolayne Panther 02/26/2023, 6:15 PM

## 2023-02-26 NOTE — Transfer of Care (Signed)
Immediate Anesthesia Transfer of Care Note  Patient: Bobby Bradley  Procedure(s) Performed: CORONARY ARTERY BYPASS GRAFTING (CABG) X 4 WITH LEFT INTERNAL MAMMARY ARTERY HARVEST AND RIGHT GREATER SAPHENOUS VEIN HARVESTED ENDOSCOPICALLY (Chest) CLIPPING OF LEFT ATRIAL APPENDAGE TRANSESOPHAGEAL ECHOCARDIOGRAM  Patient Location: SICU  Anesthesia Type:General  Level of Consciousness: sedated and Patient remains intubated per anesthesia plan  Airway & Oxygen Therapy: Patient remains intubated per anesthesia plan and Patient placed on Ventilator (see vital sign flow sheet for setting)  Post-op Assessment: Report given to RN and Post -op Vital signs reviewed and stable  Post vital signs: Reviewed and stable  Last Vitals:  Vitals Value Taken Time  BP 94/52 02/26/23 1322  Temp 36.6 C 02/26/23 1325  Pulse 78 02/26/23 1325  Resp 13 02/26/23 1325  SpO2 95 % 02/26/23 1325  Vitals shown include unvalidated device data.  Last Pain:  Vitals:   02/26/23 0629  TempSrc:   PainSc: 0-No pain         Complications: No notable events documented.

## 2023-02-26 NOTE — Brief Op Note (Signed)
02/26/2023  8:05 AM  PATIENT:  Bobby Bradley  77 y.o. male  PRE-OPERATIVE DIAGNOSIS:  CORONARY ARTERY DISEASE, ATRIAL FIBRILLATION  POST-OPERATIVE DIAGNOSIS:  CORONARY ARTERY DISEASE, ATRIAL FIBRILLATION  PROCEDURE:  CORONARY ARTERY BYPASS GRAFTING (CABG) X 4 WITH LEFT INTERNAL MAMMARY ARTERY HARVEST AND RIGHT GREATER SAPHENOUS VEIN HARVESTED ENDOSCOPICALLY  CLIPPING OF LEFT ATRIAL APPENDAGE ( ) TRANSESOPHAGEAL ECHOCARDIOGRAM  -LIMA to LAD -SVG to OM -SVG to PDA -SVG to Diagonal Vein harvest time: Vein prep time:  SURGEON:  Surgeon(s) and Role:    * Lightfoot, Eliezer Lofts, MD - Primary  PHYSICIAN ASSISTANT: Aloha Gell PA-C  ASSISTANTS: Virgilio Frees RNFA   ANESTHESIA:   general  EBL:   BLOOD ADMINISTERED:none  DRAINS:  Mediastinal and pleural drains    LOCAL MEDICATIONS USED:  NONE  SPECIMEN:  No Specimen  DISPOSITION OF SPECIMEN:  N/A  COUNTS:  NO 7-0 needle, no CXR per MD  DICTATION: .Reubin Milan Dictation  PLAN OF CARE: Admit to inpatient   PATIENT DISPOSITION:  ICU - intubated and hemodynamically stable.   Delay start of Pharmacological VTE agent (>24hrs) due to surgical blood loss or risk of bleeding: yes

## 2023-02-26 NOTE — Anesthesia Procedure Notes (Signed)
Anesthesia Procedure Image    

## 2023-02-26 NOTE — Progress Notes (Signed)
      [address]       [name] [address]             [phone]    S/p CABG, LAA clip   Intubated, sedated  BP (!) 94/52 (BP Location: Right Arm)   Pulse 64   Temp (!) 97.5 F (36.4 C)   Resp 15   Ht 6' (1.829 m)   Wt 109.8 kg   SpO2 95%   BMI 32.82 kg/m  CI 2.3 CVP 9  Intake/Output Summary (Last 24 hours) at 02/26/2023 1705 Last data filed at 02/26/2023 1600 Gross per 24 hour  Intake 3635.82 ml  Output 870 ml  Net 2765.82 ml   Minimal CT output CBG mildly elevated- on insulin drip  Hct 33  Doing well early postop  Viviann Spare C. Dorris Fetch, MD [facility] [phone]

## 2023-02-26 NOTE — Progress Notes (Signed)
  Echocardiogram Echocardiogram Transesophageal has been performed.  Bobby Bradley 02/26/2023, 8:32 AM

## 2023-02-26 NOTE — Anesthesia Procedure Notes (Signed)
Procedure Name: Intubation Date/Time: 02/26/2023 7:54 AM  Performed by: Lonia Mad, CRNAPre-anesthesia Checklist: Patient identified, Emergency Drugs available, Suction available and Patient being monitored Patient Re-evaluated:Patient Re-evaluated prior to induction Oxygen Delivery Method: Circle System Utilized Preoxygenation: Pre-oxygenation with 100% oxygen Induction Type: IV induction Ventilation: Mask ventilation without difficulty Laryngoscope Size: Mac and 4 Grade View: Grade I Tube type: Oral Tube size: 8.0 mm Number of attempts: 1 Airway Equipment and Method: Stylet and Oral airway Placement Confirmation: ETT inserted through vocal cords under direct vision, positive ETCO2 and breath sounds checked- equal and bilateral Secured at: 24 cm Tube secured with: Tape Dental Injury: Teeth and Oropharynx as per pre-operative assessment

## 2023-02-27 ENCOUNTER — Inpatient Hospital Stay (HOSPITAL_COMMUNITY): Payer: Medicare Other

## 2023-02-27 ENCOUNTER — Encounter (HOSPITAL_COMMUNITY): Payer: Self-pay | Admitting: Thoracic Surgery (Cardiothoracic Vascular Surgery)

## 2023-02-27 DIAGNOSIS — J9601 Acute respiratory failure with hypoxia: Secondary | ICD-10-CM | POA: Diagnosis not present

## 2023-02-27 LAB — MAGNESIUM
Magnesium: 2.5 mg/dL — ABNORMAL HIGH (ref 1.7–2.4)
Magnesium: 2.3 mg/dL (ref 1.7–2.4)

## 2023-02-27 LAB — ECHO INTRAOPERATIVE TEE
AV Mean grad: 4 mmHg
AV Peak grad: 6.2 mmHg
Ao pk vel: 1.24 m/s
Height: 72 in
S' Lateral: 4.28 cm
Single Plane A4C EF: 52.5 %

## 2023-02-27 LAB — CBC
HCT: 36.8 % — ABNORMAL LOW (ref 39.0–52.0)
HCT: 36.9 % — ABNORMAL LOW (ref 39.0–52.0)
Hemoglobin: 12.4 g/dL — ABNORMAL LOW (ref 13.0–17.0)
Hemoglobin: 12.3 g/dL — ABNORMAL LOW (ref 13.0–17.0)
MCH: 29.6 pg (ref 26.0–34.0)
MCH: 29.5 pg (ref 26.0–34.0)
MCHC: 33.7 g/dL (ref 30.0–36.0)
MCHC: 33.3 g/dL (ref 30.0–36.0)
MCV: 87.8 fL (ref 80.0–100.0)
MCV: 88.5 fL (ref 80.0–100.0)
Platelets: 220 10*3/uL (ref 150–400)
Platelets: 200 10*3/uL (ref 150–400)
RBC: 4.19 MIL/uL — ABNORMAL LOW (ref 4.22–5.81)
RBC: 4.17 MIL/uL — ABNORMAL LOW (ref 4.22–5.81)
RDW: 13.2 % (ref 11.5–15.5)
RDW: 13.4 % (ref 11.5–15.5)
WBC: 18.9 10*3/uL — ABNORMAL HIGH (ref 4.0–10.5)
WBC: 18.5 10*3/uL — ABNORMAL HIGH (ref 4.0–10.5)
nRBC: 0 % (ref 0.0–0.2)
nRBC: 0 % (ref 0.0–0.2)

## 2023-02-27 LAB — GLUCOSE, CAPILLARY
Glucose-Capillary: 115 mg/dL — ABNORMAL HIGH (ref 70–99)
Glucose-Capillary: 124 mg/dL — ABNORMAL HIGH (ref 70–99)
Glucose-Capillary: 126 mg/dL — ABNORMAL HIGH (ref 70–99)
Glucose-Capillary: 136 mg/dL — ABNORMAL HIGH (ref 70–99)
Glucose-Capillary: 141 mg/dL — ABNORMAL HIGH (ref 70–99)
Glucose-Capillary: 141 mg/dL — ABNORMAL HIGH (ref 70–99)
Glucose-Capillary: 184 mg/dL — ABNORMAL HIGH (ref 70–99)
Glucose-Capillary: 193 mg/dL — ABNORMAL HIGH (ref 70–99)
Glucose-Capillary: 195 mg/dL — ABNORMAL HIGH (ref 70–99)
Glucose-Capillary: 203 mg/dL — ABNORMAL HIGH (ref 70–99)

## 2023-02-27 LAB — BASIC METABOLIC PANEL
Anion gap: 9 (ref 5–15)
Anion gap: 7 (ref 5–15)
BUN: 15 mg/dL (ref 8–23)
BUN: 15 mg/dL (ref 8–23)
CO2: 21 mmol/L — ABNORMAL LOW (ref 22–32)
CO2: 22 mmol/L (ref 22–32)
Calcium: 7.8 mg/dL — ABNORMAL LOW (ref 8.9–10.3)
Calcium: 7.8 mg/dL — ABNORMAL LOW (ref 8.9–10.3)
Chloride: 103 mmol/L (ref 98–111)
Chloride: 99 mmol/L (ref 98–111)
Creatinine, Ser: 1.13 mg/dL (ref 0.61–1.24)
Creatinine, Ser: 1.06 mg/dL (ref 0.61–1.24)
GFR, Estimated: >60 mL/min (ref >60)
GFR, Estimated: >60 mL/min (ref >60)
Glucose, Bld: 134 mg/dL — ABNORMAL HIGH (ref 70–99)
Glucose, Bld: 196 mg/dL — ABNORMAL HIGH (ref 70–99)
Potassium: 4.8 mmol/L (ref 3.5–5.1)
Potassium: 4.4 mmol/L (ref 3.5–5.1)
Sodium: 133 mmol/L — ABNORMAL LOW (ref 135–145)
Sodium: 128 mmol/L — ABNORMAL LOW (ref 135–145)

## 2023-02-27 MED ORDER — APIXABAN 5 MG PO TABS
5.0000 mg | ORAL_TABLET | Freq: Two times a day (BID) | ORAL | Status: DC
  Administered 2023-02-27 – 2023-03-03 (×8): 5 mg via ORAL
  Filled 2023-02-27 (×8): qty 1

## 2023-02-27 MED ORDER — INSULIN ASPART 100 UNIT/ML IJ SOLN
2.0000 [IU] | INTRAMUSCULAR | Status: DC
  Administered 2023-02-27: 6 [IU] via SUBCUTANEOUS
  Administered 2023-02-27: 4 [IU] via SUBCUTANEOUS
  Administered 2023-02-27: 2 [IU] via SUBCUTANEOUS
  Administered 2023-02-28: 4 [IU] via SUBCUTANEOUS
  Administered 2023-02-28: 2 [IU] via SUBCUTANEOUS
  Administered 2023-02-28: 6 [IU] via SUBCUTANEOUS

## 2023-02-27 MED ORDER — INSULIN DETEMIR 100 UNIT/ML ~~LOC~~ SOLN
5.0000 [IU] | Freq: Two times a day (BID) | SUBCUTANEOUS | Status: DC
  Administered 2023-02-27 (×2): 5 [IU] via SUBCUTANEOUS
  Filled 2023-02-27 (×4): qty 0.05

## 2023-02-27 MED ORDER — ASPIRIN 81 MG PO TBEC
81.0000 mg | DELAYED_RELEASE_TABLET | Freq: Every day | ORAL | Status: DC
  Administered 2023-02-27 – 2023-03-03 (×5): 81 mg via ORAL
  Filled 2023-02-27 (×5): qty 1

## 2023-02-27 MED ORDER — ENOXAPARIN SODIUM 30 MG/0.3ML IJ SOSY: 30.0000 mg | PREFILLED_SYRINGE | Freq: Every day | INTRAMUSCULAR | Status: DC

## 2023-02-27 MED ORDER — ASPIRIN 81 MG PO CHEW
81.0000 mg | CHEWABLE_TABLET | Freq: Every day | ORAL | Status: DC
  Filled 2023-02-27: qty 1

## 2023-02-27 MED FILL — Lidocaine HCl Local Preservative Free (PF) Inj 2%: INTRAMUSCULAR | Qty: 14 | Status: AC

## 2023-02-27 MED FILL — Heparin Sodium (Porcine) Inj 1000 Unit/ML: Qty: 1000 | Status: AC

## 2023-02-27 MED FILL — Potassium Chloride Inj 2 mEq/ML: INTRAVENOUS | Qty: 40 | Status: AC

## 2023-02-27 NOTE — Progress Notes (Addendum)
[address]       [place] [address]             [phone]      1 Day Post-Op Procedure(s) (LRB): CORONARY ARTERY BYPASS GRAFTING (CABG) X 4 WITH LEFT INTERNAL MAMMARY ARTERY HARVEST AND RIGHT GREATER SAPHENOUS VEIN HARVESTED ENDOSCOPICALLY (N/A) CLIPPING OF LEFT ATRIAL APPENDAGE (N/A) TRANSESOPHAGEAL ECHOCARDIOGRAM (N/A) Subjective: Pt up in chair this morning, some sternal pain controlled with pain medication. No other complaints.  Objective: Vital signs in last 24 hours: Temp:  [97.3 F (36.3 C)-100.2 F (37.9 C)] 100 F (37.8 C) (06/18 0500) Pulse Rate:  [64-80] 76 (06/18 0500) Cardiac Rhythm: Heart block (06/17 2000) Resp:  [12-26] 23 (06/18 0500) BP: (86-103)/(51-64) 94/53 (06/18 0500) SpO2:  [90 %-98 %] 90 % (06/18 0500) Arterial Line BP: (83-144)/(35-65) 122/52 (06/18 0500) FiO2 (%):  [40 %-50 %] 40 % (06/17 1740) Weight:  [113.1 kg] 113.1 kg (06/18 0500)  Hemodynamic parameters for last 24 hours: CVP:  [4 mmHg-19 mmHg] 12 mmHg  Intake/Output from previous day: 06/17 0701 - 06/18 0700 In: 4725.3 [I.V.:3412.1; Blood:360; IV Piggyback:893.2] Out: 1595 [Urine:1365; Chest Tube:230] Intake/Output this shift: No intake/output data recorded.  General appearance: alert, cooperative, and no distress Neurologic: intact Heart: regular rate and rhythm, S1, S2 normal, no murmur, click, rub or gallop Lungs: slightly diminished bibasilar breath sounds Abdomen: soft, non-tender; bowel sounds normal; no masses,  no organomegaly Extremities: edema trace Wound: Clean and dry dressing in place  Lab Results: Recent Labs    02/26/23 1919 02/27/23 0429  WBC 16.6* 18.9*  HGB 12.2*  13.1 12.4*  HCT 36.0*  38.1* 36.8*  PLT 231 220   BMET:  Recent Labs    02/26/23 1919 02/27/23 0429  NA 136  135 133*  K 5.2*  5.0 4.8  CL 105 103  CO2 20* 21*  GLUCOSE 127* 134*  BUN 17 15  CREATININE 1.13 1.13  CALCIUM 8.2* 7.8*    PT/INR:  Recent  Labs    02/26/23 1334  LABPROT 16.5*  INR 1.3*   ABG    Component Value Date/Time   PHART 7.327 (L) 02/26/2023 1919   HCO3 22.4 02/26/2023 1919   TCO2 24 02/26/2023 1919   ACIDBASEDEF 3.0 (H) 02/26/2023 1919   O2SAT 97 02/26/2023 1919   CBG (last 3)  Recent Labs    02/27/23 0016 02/27/23 0225 02/27/23 0424  GLUCAP 136* 115* 141*    Assessment/Plan: S/P Procedure(s) (LRB): CORONARY ARTERY BYPASS GRAFTING (CABG) X 4 WITH LEFT INTERNAL MAMMARY ARTERY HARVEST AND RIGHT GREATER SAPHENOUS VEIN HARVESTED ENDOSCOPICALLY (N/A) CLIPPING OF LEFT ATRIAL APPENDAGE (N/A) TRANSESOPHAGEAL ECHOCARDIOGRAM (N/A)  Neuro: Pain well controlled.  CV: NSR, HR 70s. Pt with PPM. CI 3. SBP 140 this AM, on Neo . Wean if systolic >100. Hold Lopressor today. D/C Flotrac and arterial line once off Neo. D/C EPW today. Restart Eliquis later today.  Pulm: Extubated yesterday evening. Saturating well on 3L [place]. CXR with bibasilar atelectasis. CT output 230cc/24 hrs. Leave in place for now. Encourage IS and ambulation. Wean oxygen as tolerated.   GI: No nausea or vomiting, advance diet as tolerated.   Endo: CBGs controlled on Insulin drip, transition to SSI.   ID: Leukocytosis, WBC 18.9. Tmax 100.2. Likely reactive, will monitor.  Renal: Cr 1.13. Good UO 1365cc/24hrs. D/C foley catheter. Pt incontinent prior to surgery. +7lbs, hold Lasix for today until BP improves.   Expected postop ABLA: H/H 12.4/36.8,  monitor.   Dispo: Wean Neo. Continue ICU care today.   LOS: 1 day    Jenny Reichmann, PA-C 02/27/2023  Agree with above POD 1 progression Will start eliquis today Will keep Cts  Daevion Navarette O Earnestene Angello

## 2023-02-27 NOTE — Progress Notes (Signed)
NAME:  Bobby Bradley, MRN:  [id number], DOB:  1946/08/29, LOS: 1 ADMISSION DATE:  02/26/2023, CONSULTATION DATE:  6/17 REFERRING MD:  Cliffton Asters, CHIEF COMPLAINT:  post cardiac surgery critical care support    History of Present Illness:  77 year old male who was referred to cardiothoracic surgery for severe 3 V CAD w/ progressive dyspnea and PET scan eval showed  large reversible defect in apical inferior, inferolateral, and anterolateral segments w/ EF down from 32 to 29% w/ exertion.  He underwent 3V CABG by Dr Cliffton Asters on 6/17  Cardiac bypass initiated at 1034; removed at 1209 PCCM asked to assist w/ post op management   Pertinent  Medical History  pAF CHA2DS2-VASc Score = 7 on DOAC at home, CAD, ICD, CM w/ HFrEF, HTN, prior TIA, DM, HL, prostate cancer,  Significant Hospital Events: Including procedures, antibiotic start and stop dates in addition to other pertinent events   6/17 CABG x 4 utilizing LIMA to LAD, SVG to OM, SVG to PDA, and SVG to Diagonal as well as endoscopic harvest of the right greater saphenous vein and clipping of the left atrial appendage. Extubated that evening 6/18 still om 3lpm [place]. Basilar atx on cxr. OOB. Removing pacer wires. Changed NEO target to SBP > 100 as still on low dose neo. Foley removed. Resumed DOAC , transitioned to SSI   Interim History / Subjective:  No distress. Got a little light headed from sitting to standing  Objective   Blood pressure (Abnormal) 94/53, pulse 76, temperature 100 F (37.8 C), resp. rate (Abnormal) 23, height 6' (1.829 m), weight 113.1 kg, SpO2 90 %. CVP:  [4 mmHg-19 mmHg] 12 mmHg  Vent Mode: SIMV;PSV;PRVC FiO2 (%):  [40 %-50 %] 40 % Set Rate:  [4 bmp-15 bmp] 4 bmp Vt Set:  [620 mL] 620 mL PEEP:  [5 cmH20] 5 cmH20 Plateau Pressure:  [23 cmH20] 23 cmH20   Intake/Output Summary (Last 24 hours) at 02/27/2023 0801 Last data filed at 02/27/2023 0500 Gross per 24 hour  Intake 4725.26 ml  Output 1595 ml  Net 3130.26 ml    Filed Weights   02/26/23 0548 02/27/23 0500  Weight: 109.8 kg 113.1 kg    Examination: General 77 year old male sitting up in bed No distress HENT NCAT no JVD right internal jugular unremarkable Pulm clear dec bases, IS pulling 1000-1250 cc. Currently on 3 lpm [place] PCXR personally reviewed L>R atx.  Card RRR. CI > 2. Still on low dose Neo. Sternal dressing CD&I. CT output total 230 since surgery  Abd soft Ext warm, dry good pulses Neuro intact GU clear yellow   Resolved Hospital Problem list   Post op ventilation management (extubated 6/17)  Assessment & Plan:  3V CAD w/ HFrEF (29-32%), S/p 4 V CABG 6/17 Plan Wean phenylephrine SBP > 100 He is off inotropic support; Cont post op asa and starting DOAC Tight glycemic control  Foley out today Pacemaker wires out today Keeping chest tubes 1 more day as starting AC  PRN analgesia  Tele Cont statin   Hypoxia w/ L>R post op atelectasis  Plan Cont IS Mobilize Pain control  Wean O2  Cont pulse ox  H/o pAF now s/p clipping of atrial appendage & currently in NSR Plan Resume DOAC Low dose lopressor    Post op anemia Plan Monitor  Am cbc Watching CT output w/ resuming DOAC  H/o urinary incont Plan Removing foley today  Nursing to place external device. Usually wears briefs  Type II DM Plan Starting ssi and levemir   Best Practice (right click and "Reselect all SmartList Selections" daily)   Diet/type: clear liquids DVT prophylaxis: DOAC per surgical team  GI prophylaxis: PPI Lines: Central line Foley:  Yes, and it is no longer needed and removal ordered  Code Status:  full code Last date of multidisciplinary goals of care discussion [per primary ]   Critical care time NA      Simonne Martinet ACNP-BC [facility] Pulmonary/Critical Care Pager # [phone] OR # [phone] if no answer

## 2023-02-27 NOTE — Discharge Instructions (Signed)

## 2023-02-28 ENCOUNTER — Inpatient Hospital Stay (HOSPITAL_COMMUNITY): Payer: Medicare Other

## 2023-02-28 DIAGNOSIS — Z951 Presence of aortocoronary bypass graft: Secondary | ICD-10-CM | POA: Diagnosis not present

## 2023-02-28 LAB — CBC
HCT: 34.7 % — ABNORMAL LOW (ref 39.0–52.0)
Hemoglobin: 11.9 g/dL — ABNORMAL LOW (ref 13.0–17.0)
MCH: 30 pg (ref 26.0–34.0)
MCHC: 34.3 g/dL (ref 30.0–36.0)
MCV: 87.4 fL (ref 80.0–100.0)
Platelets: 155 10*3/uL (ref 150–400)
RBC: 3.97 MIL/uL — ABNORMAL LOW (ref 4.22–5.81)
RDW: 13.2 % (ref 11.5–15.5)
WBC: 15.6 10*3/uL — ABNORMAL HIGH (ref 4.0–10.5)
nRBC: 0 % (ref 0.0–0.2)

## 2023-02-28 LAB — BASIC METABOLIC PANEL
Anion gap: 9 (ref 5–15)
BUN: 14 mg/dL (ref 8–23)
CO2: 24 mmol/L (ref 22–32)
Calcium: 8 mg/dL — ABNORMAL LOW (ref 8.9–10.3)
Chloride: 99 mmol/L (ref 98–111)
Creatinine, Ser: 1.06 mg/dL (ref 0.61–1.24)
GFR, Estimated: >60 mL/min (ref >60)
Glucose, Bld: 140 mg/dL — ABNORMAL HIGH (ref 70–99)
Potassium: 4.4 mmol/L (ref 3.5–5.1)
Sodium: 132 mmol/L — ABNORMAL LOW (ref 135–145)

## 2023-02-28 LAB — GLUCOSE, CAPILLARY
Glucose-Capillary: 165 mg/dL — ABNORMAL HIGH (ref 70–99)
Glucose-Capillary: 215 mg/dL — ABNORMAL HIGH (ref 70–99)
Glucose-Capillary: 148 mg/dL — ABNORMAL HIGH (ref 70–99)
Glucose-Capillary: 190 mg/dL — ABNORMAL HIGH (ref 70–99)
Glucose-Capillary: 148 mg/dL — ABNORMAL HIGH (ref 70–99)

## 2023-02-28 MED ORDER — POTASSIUM CHLORIDE CRYS ER 20 MEQ PO TBCR
20.0000 meq | EXTENDED_RELEASE_TABLET | Freq: Every day | ORAL | Status: DC
  Administered 2023-02-28: 20 meq via ORAL
  Filled 2023-02-28: qty 1

## 2023-02-28 MED ORDER — SODIUM CHLORIDE 0.9 % IV SOLN: 250.0000 mL | INTRAVENOUS | Status: DC | PRN

## 2023-02-28 MED ORDER — INSULIN ASPART 100 UNIT/ML IJ SOLN
0.0000 [IU] | Freq: Three times a day (TID) | INTRAMUSCULAR | Status: DC
  Administered 2023-02-28 – 2023-03-01 (×2): 3 [IU] via SUBCUTANEOUS
  Administered 2023-03-01: 2 [IU] via SUBCUTANEOUS
  Administered 2023-03-01: 3 [IU] via SUBCUTANEOUS
  Administered 2023-03-02: 2 [IU] via SUBCUTANEOUS

## 2023-02-28 MED ORDER — AMIODARONE HCL IN DEXTROSE 360-4.14 MG/200ML-% IV SOLN
60.0000 mg/h | INTRAVENOUS | Status: AC
  Administered 2023-02-28 (×2): 60 mg/h via INTRAVENOUS
  Filled 2023-02-28: qty 400

## 2023-02-28 MED ORDER — FUROSEMIDE 40 MG PO TABS
40.0000 mg | ORAL_TABLET | Freq: Every day | ORAL | Status: DC
  Administered 2023-02-28: 40 mg via ORAL
  Filled 2023-02-28: qty 1

## 2023-02-28 MED ORDER — SODIUM CHLORIDE 0.9% FLUSH
3.0000 mL | Freq: Two times a day (BID) | INTRAVENOUS | Status: DC
  Administered 2023-02-28 – 2023-03-03 (×4): 3 mL via INTRAVENOUS

## 2023-02-28 MED ORDER — SODIUM CHLORIDE 0.9% FLUSH: 3.0000 mL | INTRAVENOUS | Status: DC | PRN

## 2023-02-28 MED ORDER — LACTULOSE 10 GM/15ML PO SOLN
20.0000 g | Freq: Once | ORAL | Status: AC
  Administered 2023-02-28: 20 g via ORAL
  Filled 2023-02-28: qty 30

## 2023-02-28 MED ORDER — INSULIN DETEMIR 100 UNIT/ML ~~LOC~~ SOLN
8.0000 [IU] | Freq: Two times a day (BID) | SUBCUTANEOUS | Status: DC
  Administered 2023-02-28 (×2): 8 [IU] via SUBCUTANEOUS
  Filled 2023-02-28 (×5): qty 0.08

## 2023-02-28 MED ORDER — POLYETHYLENE GLYCOL 3350 17 G PO PACK
17.0000 g | PACK | Freq: Every day | ORAL | Status: DC
  Administered 2023-02-28 – 2023-03-01 (×2): 17 g via ORAL
  Filled 2023-02-28 (×2): qty 1

## 2023-02-28 MED ORDER — GUAIFENESIN ER 600 MG PO TB12
1200.0000 mg | ORAL_TABLET | Freq: Two times a day (BID) | ORAL | Status: DC
  Administered 2023-02-28 – 2023-03-03 (×7): 1200 mg via ORAL
  Filled 2023-02-28 (×7): qty 2

## 2023-02-28 MED ORDER — AMIODARONE HCL IN DEXTROSE 360-4.14 MG/200ML-% IV SOLN
30.0000 mg/h | INTRAVENOUS | Status: AC
  Administered 2023-02-28 – 2023-03-02 (×4): 30 mg/h via INTRAVENOUS
  Filled 2023-02-28 (×3): qty 200

## 2023-02-28 MED ORDER — METOCLOPRAMIDE HCL 5 MG/ML IJ SOLN
10.0000 mg | Freq: Four times a day (QID) | INTRAMUSCULAR | Status: AC
  Administered 2023-02-28 (×2): 10 mg via INTRAVENOUS
  Filled 2023-02-28 (×2): qty 2

## 2023-02-28 MED ORDER — ~~LOC~~ CARDIAC SURGERY, PATIENT & FAMILY EDUCATION: Freq: Once | Status: AC

## 2023-02-28 MED ORDER — AMIODARONE LOAD VIA INFUSION
150.0000 mg | Freq: Once | INTRAVENOUS | Status: AC
  Administered 2023-02-28: 150 mg via INTRAVENOUS
  Filled 2023-02-28: qty 83.34

## 2023-02-28 NOTE — Progress Notes (Addendum)
[address]       [place] [address]             [phone]      2 Days Post-Op Procedure(s) (LRB): CORONARY ARTERY BYPASS GRAFTING (CABG) X 4 WITH LEFT INTERNAL MAMMARY ARTERY HARVEST AND RIGHT GREATER SAPHENOUS VEIN HARVESTED ENDOSCOPICALLY (N/A) CLIPPING OF LEFT ATRIAL APPENDAGE (N/A) TRANSESOPHAGEAL ECHOCARDIOGRAM (N/A) Subjective: Pt with mild pain and shortness of breath when walking, says its tough to cough up mucus.   Objective: Vital signs in last 24 hours: Temp:  [97.9 F (36.6 C)-99.9 F (37.7 C)] 98 F (36.7 C) (06/19 0400) Pulse Rate:  [58-88] 88 (06/19 0700) Cardiac Rhythm: Heart block (06/18 2000) Resp:  [14-27] 25 (06/19 0700) BP: (94-129)/(51-78) 109/57 (06/19 0600) SpO2:  [91 %-94 %] 91 % (06/19 0700) Arterial Line BP: (118)/(51) 118/51 (06/18 0800)  Hemodynamic parameters for last 24 hours:    Intake/Output from previous day: 06/18 0701 - 06/19 0700 In: 1023.5 [P.O.:660; I.V.:163.7; IV Piggyback:199.9] Out: 1555 [Urine:1385; Chest Tube:170] Intake/Output this shift: No intake/output data recorded.  General appearance: alert, cooperative, and no distress Neurologic: intact Heart: Regular rate and rhythm, no murmur Lungs: Diminished at left base Abdomen: soft, non-tender; bowel sounds normal; no masses,  no organomegaly Extremities: edema trace Wound: Clean and dry dressing in place over sternal wound, EVH site is clean and dry, no sign of infection  Lab Results: Recent Labs    02/27/23 1700 02/28/23 0438  WBC 18.5* 15.6*  HGB 12.3* 11.9*  HCT 36.9* 34.7*  PLT 200 155   BMET:  Recent Labs    02/27/23 1700 02/28/23 0438  NA 128* 132*  K 4.4 4.4  CL 99 99  CO2 22 24  GLUCOSE 196* 140*  BUN 15 14  CREATININE 1.06 1.06  CALCIUM 7.8* 8.0*    PT/INR:  Recent Labs    02/26/23 1334  LABPROT 16.5*  INR 1.3*   ABG    Component Value Date/Time   PHART 7.327 (L) 02/26/2023 1919   HCO3 22.4 02/26/2023 1919    TCO2 24 02/26/2023 1919   ACIDBASEDEF 3.0 (H) 02/26/2023 1919   O2SAT 97 02/26/2023 1919   CBG (last 3)  Recent Labs    02/27/23 2001 02/27/23 2313 02/28/23 0435  GLUCAP 203* 141* 165*    Assessment/Plan: S/P Procedure(s) (LRB): CORONARY ARTERY BYPASS GRAFTING (CABG) X 4 WITH LEFT INTERNAL MAMMARY ARTERY HARVEST AND RIGHT GREATER SAPHENOUS VEIN HARVESTED ENDOSCOPICALLY (N/A) CLIPPING OF LEFT ATRIAL APPENDAGE (N/A) TRANSESOPHAGEAL ECHOCARDIOGRAM (N/A)  Neuro: Pain well controlled.   CV: NSR with PVCs, HR 60s-80s. Pt with PPM. SBP 109 this AM. Eliquis restarted for hx of stroke and atrial fibrillation. Continue low dose Lopressor.   Pulm: Saturating 91-94% on 3L [place]. CXR with bibasilar atelectasis greater at the left base and likely a left sided pleural effusion. CT output 170cc/24 hrs. D/C chest tubes today. Encourage IS and ambulation. Critical care to add flutter valve and Mucinex, appreciate their assistance. Wean oxygen as tolerated.    GI: Continue to improve diet, no nausea or vomiting.    Endo: CBGs slightly elevated 203/141/165. Continue Levemir and SSI. Will restart Metformin later once diet has improved.    ID: Leukocytosis improved, WBC 15.6. Tmax 99.9. Likely reactive, continue to monitor.   Renal: Stable Cr 1.06. Good UO 1385cc/24hrs. Pt incontinent prior to surgery, external catheter in place. No weight this AM. Will start soft diuresis.    Expected postop ABLA:  Trending down, not at transfusion threshold. H/H 11.9/34.7, monitor.    Dispo: Transfer to 4E today    LOS: 2 days    Jenny Reichmann, PA-C 02/28/2023

## 2023-02-28 NOTE — Progress Notes (Signed)
NAME:  Bobby Bradley, MRN:  [id number], DOB:  May 07, 1946, LOS: 1 ADMISSION DATE:  02/26/2023, CONSULTATION DATE:  6/17 REFERRING MD:  Cliffton Asters, CHIEF COMPLAINT:  post cardiac surgery critical care support    History of Present Illness:  77 year old male who was referred to cardiothoracic surgery for severe 3 V CAD w/ progressive dyspnea and PET scan eval showed  large reversible defect in apical inferior, inferolateral, and anterolateral segments w/ EF down from 32 to 29% w/ exertion.  He underwent 3V CABG by Dr Cliffton Asters on 6/17  Cardiac bypass initiated at 1034; removed at 1209 PCCM asked to assist w/ post op management   Pertinent  Medical History  pAF CHA2DS2-VASc Score = 7 on DOAC at home, CAD, ICD, CM w/ HFrEF, HTN, prior TIA, DM, HL, prostate cancer,  Significant Hospital Events: Including procedures, antibiotic start and stop dates in addition to other pertinent events   6/17 CABG x 4 utilizing LIMA to LAD, SVG to OM, SVG to PDA, and SVG to Diagonal as well as endoscopic harvest of the right greater saphenous vein and clipping of the left atrial appendage. Extubated that evening 6/18 still om 3lpm [place]. Basilar atx on cxr. OOB. Removing pacer wires. Changed NEO target to SBP > 100 as still on low dose neo. Foley removed. Resumed DOAC , transitioned to SSI   Interim History / Subjective:  No distress. Got a little light headed from sitting to standing  Objective   Blood pressure (Abnormal) 94/53, pulse 76, temperature 100 F (37.8 C), resp. rate (Abnormal) 23, height 6' (1.829 m), weight 113.1 kg, SpO2 90 %. CVP:  [4 mmHg-19 mmHg] 12 mmHg  Vent Mode: SIMV;PSV;PRVC FiO2 (%):  [40 %-50 %] 40 % Set Rate:  [4 bmp-15 bmp] 4 bmp Vt Set:  [355 mL] 620 mL PEEP:  [5 cmH20] 5 cmH20 Plateau Pressure:  [23 cmH20] 23 cmH20   Intake/Output Summary (Last 24 hours) at 02/27/2023 0801 Last data filed at 02/27/2023 0500 Gross per 24 hour  Intake 4725.26 ml  Output 1595 ml  Net 3130.26 ml    Filed Weights   02/26/23 0548 02/27/23 0500  Weight: 109.8 kg 113.1 kg    Examination: General pleasant 77 year old male patient sitting up in the chair he is already walked this morning he is in no acute distress HEENT normocephalic atraumatic no jugular venous distention his right IJ triple-lumen catheter is unremarkable Pulmonary: Diminished left base, no accessory use still on 3 L/min portable chest x-ray personally reviewed this shows ongoing left basilar atelectasis Cardiac: Regular rate and rhythm without murmur rub or gallop chest tube with minimal output midsternal dressing clean dry and intact Abdomen: Soft nontender no organomegaly Extremities: Trace dependent edema pulses strong Neuro: Awake oriented no focal deficits appreciated.  Resolved Hospital Problem list   Post op ventilation management (extubated 6/17)  Assessment & Plan:  3V CAD w/ HFrEF (29-32%), S/p 4 V CABG 6/17 Plan Wean phenylephrine SBP > 100 Cont ASa and DOAC Tight glycemic control  Chest tube out today  Pacemaker wires out today PRN analgesia  Tele Cont statin   Hypoxia w/ L>R post op atelectasis  Still requiring 3 lpm via [place]  Plan Cont IS Mobilize Add flutter Add mucinex  Pain control  Wean O2  Cont pulse ox Primary team adding diuretics   H/o pAF now s/p clipping of atrial appendage & currently in NSR Plan Resume DOAC Low dose lopressor    Post op anemia Plan Monitor  H/o urinary incont Plan Management per nursing   Type II DM Glycemic control is suboptimal Plan Change SSI to mod scale [employer] levemir to 8 units bid   Best Practice (right click and "Reselect all SmartList Selections" daily)   Diet/type: clear liquids DVT prophylaxis: DOAC per surgical team  GI prophylaxis: PPI Lines: Central line Foley:  Yes, and it is no longer needed and removal ordered  Code Status:  full code Last date of multidisciplinary goals of care discussion [per primary  ]   Critical care time NA     Critical care will sign off and we are happy to be reinvolved as needed   Simonne Martinet ACNP-BC [facility] Pulmonary/Critical Care Pager # [phone] OR # [phone] if no answer

## 2023-03-01 ENCOUNTER — Inpatient Hospital Stay (HOSPITAL_COMMUNITY): Payer: Medicare Other

## 2023-03-01 LAB — CBC
HCT: 34.5 % — ABNORMAL LOW (ref 39.0–52.0)
Hemoglobin: 11.7 g/dL — ABNORMAL LOW (ref 13.0–17.0)
MCH: 29.9 pg (ref 26.0–34.0)
MCHC: 33.9 g/dL (ref 30.0–36.0)
MCV: 88.2 fL (ref 80.0–100.0)
Platelets: 177 10*3/uL (ref 150–400)
RBC: 3.91 MIL/uL — ABNORMAL LOW (ref 4.22–5.81)
RDW: 13.4 % (ref 11.5–15.5)
WBC: 16.2 10*3/uL — ABNORMAL HIGH (ref 4.0–10.5)
nRBC: 0 % (ref 0.0–0.2)

## 2023-03-01 LAB — BASIC METABOLIC PANEL
Anion gap: 7 (ref 5–15)
BUN: 17 mg/dL (ref 8–23)
CO2: 22 mmol/L (ref 22–32)
Calcium: 7.9 mg/dL — ABNORMAL LOW (ref 8.9–10.3)
Chloride: 102 mmol/L (ref 98–111)
Creatinine, Ser: 1.1 mg/dL (ref 0.61–1.24)
GFR, Estimated: >60 mL/min (ref >60)
Glucose, Bld: 150 mg/dL — ABNORMAL HIGH (ref 70–99)
Potassium: 4.8 mmol/L (ref 3.5–5.1)
Sodium: 131 mmol/L — ABNORMAL LOW (ref 135–145)

## 2023-03-01 LAB — GLUCOSE, CAPILLARY
Glucose-Capillary: 154 mg/dL — ABNORMAL HIGH (ref 70–99)
Glucose-Capillary: 180 mg/dL — ABNORMAL HIGH (ref 70–99)
Glucose-Capillary: 146 mg/dL — ABNORMAL HIGH (ref 70–99)
Glucose-Capillary: 127 mg/dL — ABNORMAL HIGH (ref 70–99)

## 2023-03-01 MED ORDER — METFORMIN HCL ER 500 MG PO TB24
500.0000 mg | ORAL_TABLET | Freq: Two times a day (BID) | ORAL | Status: DC
  Administered 2023-03-01 – 2023-03-02 (×3): 500 mg via ORAL
  Filled 2023-03-01 (×3): qty 1

## 2023-03-01 MED ORDER — FUROSEMIDE 10 MG/ML IJ SOLN
40.0000 mg | Freq: Once | INTRAMUSCULAR | Status: AC
  Administered 2023-03-01: 40 mg via INTRAVENOUS
  Filled 2023-03-01: qty 4

## 2023-03-01 NOTE — Progress Notes (Addendum)
[address]       [place] [address]             [phone]      3 Days Post-Op Procedure(s) (LRB): CORONARY ARTERY BYPASS GRAFTING (CABG) X 4 WITH LEFT INTERNAL MAMMARY ARTERY HARVEST AND RIGHT GREATER SAPHENOUS VEIN HARVESTED ENDOSCOPICALLY (N/A) CLIPPING OF LEFT ATRIAL APPENDAGE (N/A) TRANSESOPHAGEAL ECHOCARDIOGRAM (N/A) Subjective: Pt has no new complaints this morning, states it is easier to cough up mucus since Mucinex was started. States his pain is well controlled and he denies SOB. Walked twice yesterday.  Objective: Vital signs in last 24 hours: Temp:  [97.6 F (36.4 C)-99.2 F (37.3 C)] 97.7 F (36.5 C) (06/20 0337) Pulse Rate:  [56-92] 56 (06/20 0500) Cardiac Rhythm: Atrial fibrillation (06/19 2030) Resp:  [16-24] 24 (06/20 0500) BP: (94-131)/(53-73) 96/58 (06/20 0337) SpO2:  [91 %-97 %] 94 % (06/20 0500) Weight:  [113.3 kg] 113.3 kg (06/20 0500)  Hemodynamic parameters for last 24 hours:    Intake/Output from previous day: 06/19 0701 - 06/20 0700 In: 1062.6 [P.O.:180; I.V.:682.6; IV Piggyback:200] Out: 1360 [Urine:1300; Chest Tube:60] Intake/Output this shift: No intake/output data recorded.  General appearance: alert, cooperative, and no distress Neurologic: intact Heart: irregularly irregular rhythm and no murmur Lungs: slightly diminished at left base Abdomen: soft, non-tender; bowel sounds normal; no masses,  no organomegaly Extremities: edema trace Wound: Clean and dry without erythema or sign of infection  Lab Results: Recent Labs    02/28/23 0438 03/01/23 0138  WBC 15.6* 16.2*  HGB 11.9* 11.7*  HCT 34.7* 34.5*  PLT 155 177   BMET:  Recent Labs    02/28/23 0438 03/01/23 0138  NA 132* 131*  K 4.4 4.8  CL 99 102  CO2 24 22  GLUCOSE 140* 150*  BUN 14 17  CREATININE 1.06 1.10  CALCIUM 8.0* 7.9*    PT/INR:  Recent Labs    02/26/23 1334  LABPROT 16.5*  INR 1.3*   ABG    Component Value Date/Time    PHART 7.327 (L) 02/26/2023 1919   HCO3 22.4 02/26/2023 1919   TCO2 24 02/26/2023 1919   ACIDBASEDEF 3.0 (H) 02/26/2023 1919   O2SAT 97 02/26/2023 1919   CBG (last 3)  Recent Labs    02/28/23 1609 02/28/23 2047 03/01/23 0543  GLUCAP 190* 148* 154*    Assessment/Plan: S/P Procedure(s) (LRB): CORONARY ARTERY BYPASS GRAFTING (CABG) X 4 WITH LEFT INTERNAL MAMMARY ARTERY HARVEST AND RIGHT GREATER SAPHENOUS VEIN HARVESTED ENDOSCOPICALLY (N/A) CLIPPING OF LEFT ATRIAL APPENDAGE (N/A) TRANSESOPHAGEAL ECHOCARDIOGRAM (N/A)  CV: Pt with PPM. Hx of paroxysmal atrial fibrillation. Pt converted to atrial fibrillation with a controlled rate yesterday. On Amiodarone drip, still in atrial fibrillation with controlled rate, HR 70s. Pt is asymptomatic. SBP 96-128, too soft to titrate Lopressor. Eliquis restarted for hx of stroke and atrial fibrillation. Continue low Lopressor 12.5mg  BID. Try another Amio bolus?   Pulm: Saturating 96% on 3L [place]. CXR with stable bibasilar atelectasis greater at the left base and left sided pleural effusion. Continue IS, flutter valve and ambulation. Pt denies SOB. Wean oxygen.    GI: OK diet, no nausea or vomiting. Passing gas, -BM.   Endo: CBGs slightly elevated 190/148/154. Continue SSI. Will restart home Metformin 500mg  BID. Preop A1C 6.5.   ID: Leukocytosis slightly increased this AM, WBC 16.2. Tmax 99.2. Likely reactive, possibly due to atelectasis, continue to monitor. Pt denies cough and urinary complaints, incisions with no sign of  infection.  Renal: Stable Cr 1.1. Good UO 1300cc/24hrs. Pt incontinent prior to surgery, external catheter in place. Weight still +7lbs from preop, pt has been diuresing well, unsure if correct. Continue Lasix 40mg  daily. K 4.8, will hold potassium supplement today.  Hyponatremia: Mild and stable Na 131, likely due to diuresis. Will monitor.    Expected postop ABLA: Stable, H/H 11.7/34.5. Continue to monitor.    Dispo: Dispo  planning   LOS: 3 days    Jenny Reichmann, PA-C 03/01/2023  Agree with above Rate controlled afib.  On eliquis.  Underwent [place].  Will transition to PO amio tomorrow Diuresing   Ivanell Deshotel Keane Scrape

## 2023-03-01 NOTE — Discharge Summary (Signed)
[address]       [place] [address]             [phone]    Physician Discharge Summary  Patient ID: Bobby Bradley MRN: [id number] DOB/AGE: [birth year] 77 y.o.  Admit date: 02/26/2023 Discharge date: 03/03/2023  Admission Diagnoses:  Patient Active Problem List   Diagnosis Date Noted   Bronchitis 08/17/2018   Controlled type 2 diabetes mellitus without complication, without long-term current use of insulin (HCC) 02/06/2018   Cardiomyopathy (HCC) 04/16/2017   History of TIA (transient ischemic attack) 04/16/2017   Chronic anticoagulation 04/16/2017   Paroxysmal atrial fibrillation (HCC) 10/11/2016   Chronic systolic congestive heart failure (HCC) 10/11/2016   ICD (implantable cardioverter-defibrillator) in place 10/11/2016   History of prostate cancer 10/11/2016   Essential hypertension 10/11/2016     Discharge Diagnoses:  Patient Active Problem List   Diagnosis Date Noted   S/P CABG x 4 02/26/2023   Bronchitis 08/17/2018   Controlled type 2 diabetes mellitus without complication, without long-term current use of insulin (HCC) 02/06/2018   Cardiomyopathy (HCC) 04/16/2017   History of TIA (transient ischemic attack) 04/16/2017   Chronic anticoagulation 04/16/2017   Paroxysmal atrial fibrillation (HCC) 10/11/2016   Chronic systolic congestive heart failure (HCC) 10/11/2016   ICD (implantable cardioverter-defibrillator) in place 10/11/2016   History of prostate cancer 10/11/2016   Essential hypertension 10/11/2016     Discharged Condition: stable  History of Present Illness:     Bobby Bradley is a 77 year old male presents for surgical evaluation of three-vessel coronary artery disease. He has a history of permanent pacemaker placement, atrial fibrillation, and hypertension. He also has a history of diabetes mellitus, and has had TIAs in the past. He has had progressive exertional dyspnea and is unable to walk short distances before  experiencing symptoms. He denies any chest pain or lower extremity swelling.  He denies any palpitations. Echocardiogram December 2023 showed normal LV function, grade I diastolic dysfunction, and moderate left atrial enlargement. Calcium score March 2024 was in the 75th percentile and in May 2024 PET scan showed a large reversible defect in the apical inferior, inferolateral, anterolateral segment with ejection fraction dropping from 32%-29% with exercise. Cardiac catheterization on 02/12/23 showed severe 3 vessel disease involving 90% stenosis of the RCA, 99% stenosis of the circumflex, 99% stenosis of the LAD, 80% stenosis of the diagonal and 50% stenosis of the left main. Echocardiogram on 02/12/23 showed LVEF 35-40% with mild asymmetric left ventricular hypertrophy and grade I diastolic dysfunction.  Dr. Cliffton Asters reviewed the patient's diagnostic studies and determined he would benefit from surgical intervention. He reviewed the treatment options as well as risks and benefits of surgery with the patient. Bobby Bradley was agreeable to proceed with surgery.  Hospital Course: Bobby Bradley presented to [facility] and was brought to the operating room on 02/26/23. He underwent CABG x 4 utilizing LIMA to LAD, SVG to OM, SVG to PDA, and SVG to Diagonal as well as endoscopic harvest of the right greater saphenous vein and clipping of the left atrial appendage using a 45mm Atriclip. He tolerated the procedure well and was transferred to the SICU in stable condition. He was extubated the evening of surgery without complication. He required some Neo-synephrine for a soft blood pressure, drips were weaned as hemodynamics tolerated. Flotrac, arterial line and epicardial pacing wires were removed without complication. He was restarted on home Eliquis for his history of stroke and atrial fibrillation. Foley  catheter was removed without complication, external catheter was placed due to history of incontinence.  Low dose Lopressor was started. He remained on 3L [place] Oxygen, this was weaned as tolerated. CXR showed bibasilar atelectasis a left sided pleural effusion. He was volume overload and diuresed appropriately. Mucinex and flutter valve was added and pulmonary hygiene was encouraged. Chest tubes were removed without complication. Patient was felt stable for transfer to 4E. The patient converted to atrial fibrillation and was started on an Amiodarone drip. His blood pressure was too soft to titrate Lopressor. Home Metformin was restarted. He was transtioned to Home Coreg 12.5mg  BID and Home Spironolactone. He remained in rate controlled atrial fibrillation, as discussed with Dr. Cliffton Asters he was transitioned to PO Amiodarone. He remained in asymptomatic rate controlled atrial fibrillation. He was ambulating well on RA. PT and OT recommended home health PT/OT, this was arranged. His incisions were healing well without sign of infection. He was felt stable for discharge home.   Consults: None  Significant Diagnostic Studies:   RIGHT/LEFT HEART CATH AND CORONARY ANGIOGRAPHY  Left Main  Ost LM to Mid LM lesion is 50% stenosed.    Left Anterior Descending  Vessel is large.  Prox LAD lesion is 99% stenosed.    First Diagonal Branch  Vessel is large in size.  1st Diag lesion is 80% stenosed.    Left Circumflex  Vessel is large.  Ost Cx to Prox Cx lesion is 99% stenosed.  Mid Cx to Dist Cx lesion is 99% stenosed.    Third Obtuse Marginal Branch  Collaterals  3rd Mrg filled by collaterals from 2nd Sept.      Left Posterior Atrioventricular Artery  Collaterals  LPAV filled by collaterals from Dist RCA.      Right Coronary Artery  Vessel is small.  Prox RCA lesion is 90% stenosed.     ECHOCARDIOGRAM REPORT   Patient Name:   Bobby Bradley Date of Exam: 02/12/2023  Medical Rec #:  [id number]       Height:       72.0 in  Accession #:    [id number]      Weight:       246.0 lb  Date of Birth:   12-10-1945      BSA:          2.327 m  Patient Age:    76 years        BP:           135/65 mmHg  Patient Gender: M               HR:           61 bpm.  Exam Location:  Inpatient   Procedure: 2D Echo, Intracardiac Opacification Agent, Color Doppler and  Cardiac            Doppler   Indications:    CAD    History: Patient has prior history of Echocardiogram examinations,  most recent 09/01/2022. CHF and Cardiomyopathy, Stroke, Arrythmias:Atrial Fibrillation; Risk Factors:Diabetes.    Sonographer:    Darlys Gales  Referring Phys: 10 CHRISTOPHER D MCALHANY   IMPRESSIONS   1. Left ventricular ejection fraction, by estimation, is 35 to 40%. The  left ventricle has moderately decreased function. The left ventricle  demonstrates regional wall motion abnormalities (see scoring  diagram/findings for description). There is mild  asymmetric left ventricular hypertrophy of the basal-septal segment. Left  ventricular diastolic parameters are consistent with Grade I diastolic  dysfunction (  impaired relaxation).   2. Right ventricular systolic function is normal. The right ventricular  size is normal.   3. The mitral valve is normal in structure. Trivial mitral valve  regurgitation. No evidence of mitral stenosis.   4. The aortic valve is tricuspid. There is mild calcification of the  aortic valve. Aortic valve regurgitation is not visualized. Aortic valve  sclerosis/calcification is present, without any evidence of aortic  stenosis.   5. Limited study due to poor sound wave transmission. There seems to be  hypokinesis of the inferior, inferolateral and lateral walls.   FINDINGS   Left Ventricle: Left ventricular ejection fraction, by estimation, is 35  to 40%. The left ventricle has moderately decreased function. The left  ventricle demonstrates regional wall motion abnormalities. Definity  contrast agent was given IV to delineate  the left ventricular endocardial borders. The left  ventricular internal  cavity size was normal in size. There is mild asymmetric left ventricular  hypertrophy of the basal-septal segment. Left ventricular diastolic  parameters are consistent with Grade I  diastolic dysfunction (impaired relaxation).   LV Wall Scoring:  The antero-lateral wall, inferior wall, and posterior wall are  hypokinetic.   Right Ventricle: The right ventricular size is normal. No increase in  right ventricular wall thickness. Right ventricular systolic function is  normal.   Left Atrium: Left atrial size was normal in size.   Right Atrium: Right atrial size was normal in size.   Pericardium: There is no evidence of pericardial effusion.   Mitral Valve: The mitral valve is normal in structure. Trivial mitral  valve regurgitation. No evidence of mitral valve stenosis.   Tricuspid Valve: The tricuspid valve is normal in structure. Tricuspid  valve regurgitation is not demonstrated. No evidence of tricuspid  stenosis.   Aortic Valve: The aortic valve is tricuspid. There is mild calcification  of the aortic valve. Aortic valve regurgitation is not visualized. Aortic  valve sclerosis/calcification is present, without any evidence of aortic  stenosis. Aortic valve mean  gradient measures 5.0 mmHg. Aortic valve peak gradient measures 8.0 mmHg.  Aortic valve area, by VTI measures 2.74 cm.   Pulmonic Valve: The pulmonic valve was normal in structure. Pulmonic valve  regurgitation is not visualized. No evidence of pulmonic stenosis.   Aorta: The aortic root is normal in size and structure.   Venous: The inferior vena cava was not well visualized.   IAS/Shunts: No atrial level shunt detected by color flow Doppler.   Additional Comments: A device lead is visualized.     LEFT VENTRICLE  PLAX 2D  LVIDd:         6.10 cm   Diastology  LVIDs:         5.00 cm   LV e' medial:    5.77 cm/s  LV PW:         1.00 cm   LV E/e' medial:  14.8  LV IVS:        1.20 cm    LV e' lateral:   6.64 cm/s  LVOT diam:     2.10 cm   LV E/e' lateral: 12.9  LV SV:         96  LV SV Index:   41  LVOT Area:     3.46 cm     RIGHT VENTRICLE  RV S prime:     13.70 cm/s  TAPSE (M-mode): 2.3 cm   LEFT ATRIUM  Index        RIGHT ATRIUM           Index  LA Vol (A2C):   42.2 ml 18.14 ml/m  RA Area:     16.20 cm  LA Vol (A4C):   57.2 ml 24.59 ml/m  RA Volume:   35.90 ml  15.43 ml/m  LA Biplane Vol: 50.4 ml 21.66 ml/m   AORTIC VALVE  AV Area (Vmax):    2.85 cm  AV Area (Vmean):   2.94 cm  AV Area (VTI):     2.74 cm  AV Vmax:           141.00 cm/s  AV Vmean:          105.000 cm/s  AV VTI:            0.352 m  AV Peak Grad:      8.0 mmHg  AV Mean Grad:      5.0 mmHg  LVOT Vmax:         116.00 cm/s  LVOT Vmean:        89.100 cm/s  LVOT VTI:          0.278 m  LVOT/AV VTI ratio: 0.79    AORTA  Ao Root diam: 2.90 cm  Ao Asc diam:  3.20 cm   MITRAL VALVE  MV Area (PHT): 4.39 cm    SHUNTS  MV Decel Time: 173 msec    Systemic VTI:  0.28 m  MV E velocity: 85.40 cm/s  Systemic Diam: 2.10 cm  MV A velocity: 98.00 cm/s  MV E/A ratio:  0.87   Arvilla Meres MD  Electronically signed by Arvilla Meres MD  Signature Date/Time: 02/12/2023/12:57:12 PM      Final     Treatments: surgery: 02/26/2023 Patient:  Bobby Bradley Pre-Op Dx: 3V CAD HTN Hx of Atrial fibrillation Hx of ICD placement CHF DM Post-op Dx:  same Procedure: CABG X 4.  LIMA LAD, RSVG Diag, OM, PDA   Endoscopic greater saphenous vein harvest on the right Placement of 45mm Atriclip Surgeon and Role:      * Lightfoot, Eliezer Lofts, MD - Primary  Discharge Exam: Blood pressure (!) 117/90, pulse 69, temperature 97.7 F (36.5 C), temperature source Oral, resp. rate 20, height 6' (1.829 m), weight 111.2 kg, SpO2 95 %. General appearance: alert, cooperative, and no distress Neurologic: intact Heart: irregularly irregular, monitor showing atrial fibrillation with controlled  ventricular rate Lungs: clear to auscultation bilaterally Abdomen: soft, non-tender Extremities: mild pretibial edema  Wound: Clean and dry without sign of infection     Discharge Medications:  The patient has been discharged on:   1.Beta Blocker:  Yes [  X ]                              No   [   ]                              If No, reason:  2.Ace Inhibitor/ARB: Yes [   ]                                     No  [  X  ]  If No, reason: Hypotension  3.Statin:   Yes [ X  ]                  No  [   ]                  If No, reason:  4.Ecasa:  Yes  [  X ]                  No   [   ]                  If No, reason:  Patient had ACS upon admission: No  Plavix/P2Y12 inhibitor: Yes [   ]                                      No  [  X ]     Discharge Instructions     Amb Referral to Cardiac Rehabilitation   Complete by: As directed    Diagnosis: CABG   CABG X ___: 4   After initial evaluation and assessments completed: Virtual Based Care may be provided alone or in conjunction with Phase 2 Cardiac Rehab based on patient barriers.: Yes   Intensive Cardiac Rehabilitation (ICR) [facility] location only OR Traditional Cardiac Rehabilitation (TCR) *If criteria for ICR are not met will enroll in TCR [facility] only): Yes      Allergies as of 03/03/2023       Reactions   Lisinopril    Extreme diarrhea        Medication List     STOP taking these medications    amLODipine 10 MG tablet Commonly known as: NORVASC   levofloxacin 750 MG tablet Commonly known as: LEVAQUIN   losartan 100 MG tablet Commonly known as: COZAAR       TAKE these medications    amiodarone 200 MG tablet Commonly known as: PACERONE Take 2 tablets twice per day for 7 days, then take 1 tablet twice per day for 7 days, then take 1 tablet once per day thereafter   apixaban 5 MG Tabs tablet Commonly known as: Eliquis TAKE 1 TABLET(5 MG) BY MOUTH TWICE DAILY   aspirin  EC 81 MG tablet Take 81 mg by mouth daily. Swallow whole.   carvedilol 12.5 MG tablet Commonly known as: COREG Take 1 tablet (12.5 mg total) by mouth 2 (two) times daily with a meal. What changed:  medication strength See the new instructions.   cetirizine 10 MG tablet Commonly known as: ZYRTEC Take 10 mg by mouth daily.   ferrous sulfate 325 (65 FE) MG tablet Take 1 tablet (325 mg total) by mouth daily with breakfast.   metFORMIN 500 MG tablet Commonly known as: GLUCOPHAGE TAKE 1 TABLET(500 MG) BY MOUTH TWICE DAILY WITH A MEAL   MULTI VITAMIN DAILY PO Take 1 tablet by mouth daily.   oxyCODONE 5 MG immediate release tablet Commonly known as: Oxy IR/ROXICODONE Take 1 tablet (5 mg total) by mouth every 6 (six) hours as needed for severe pain.   rosuvastatin 40 MG tablet Commonly known as: CRESTOR Take 1 tablet (40 mg total) by mouth daily.   spironolactone 25 MG tablet Commonly known as: ALDACTONE Take 1 tablet (25 mg total) by mouth daily.        Follow-up Information     Lightfoot, Eliezer Lofts, MD Follow up on  03/09/2023.   Specialty: Cardiothoracic Surgery Why: Virtual appointment is at 3:00PM. Please do NOT come to the office as this is a VIRTUAL appointment. Dr. Cliffton Asters will call you. Contact information: [address] [place] [address] [phone]         Marjie Skiff E, PA-C Follow up on 03/19/2023.   Specialty: Cardiology Why: Cardiology appointment is at 10:05AM Contact information: [address] [place] [address] [place] [address] [phone]         [facility] Follow up.   Why: Iantha Fallen)- HH referral by [facility] office - HHPT/OT arranged- they will contact you to schedule Contact information: [address] [place] [address] [phone]                 Signed:  Leary Roca, PA-C  03/03/2023, 9:25 AM

## 2023-03-01 NOTE — Evaluation (Signed)
Occupational Therapy Evaluation Patient Details Name: Bobby Bradley MRN: [id number] DOB: [birth year] Today's Date: 03/01/2023   History of Present Illness 77 year old male presents 6/17 for surgical evaluation of three-vessel coronary artery disease.after 2 months of progressive DoE.  S/p CORONARY ARTERY BYPASS GRAFTING (CABG) X 4 WITH LEFT INTERNAL MAMMARY ARTERY HARVEST AND RIGHT GREATER SAPHENOUS VEIN HARVESTED ENDOSCOPICALLY PMH: permanent pacemaker placement, atrial fibrillation, and hypertension.  diabetes mellitus, TIAs.   Clinical Impression   Pt was independent prior to admission. He endorses gradual decrease in activity tolerance leading up to admission. He lives with his wife. Pt presents with generalized weakness, decreased endurance and impaired dynamic standing balance. He needs up to min assist for mobility and set up to moderate assistance for ADLs. Pt is able to state sternal precautions, but needs cues to generalize. Pt currently dependent on 3-4L O2 to maintain SpO2 > 90%. Pt is well equipped with DME at home. Recommending HHOT upon discharge.      Recommendations for follow up therapy are one component of a multi-disciplinary discharge planning process, led by the attending physician.  Recommendations may be updated based on patient status, additional functional criteria and insurance authorization.   Assistance Recommended at Discharge Frequent or constant Supervision/Assistance  Patient can return home with the following A little help with walking and/or transfers;A lot of help with bathing/dressing/bathroom;Assistance with cooking/housework;Assist for transportation;Help with stairs or ramp for entrance    Functional Status Assessment  Patient has had a recent decline in their functional status and demonstrates the ability to make significant improvements in function in a reasonable and predictable amount of time.  Equipment Recommendations  None recommended by OT     Recommendations for Other Services       Precautions / Restrictions Precautions Precautions: Fall;Sternal Precaution Comments: pt able to verbalize all precautions, verbal cues to generalize Restrictions Weight Bearing Restrictions: No Other Position/Activity Restrictions: sternal precaution      Mobility Bed Mobility Overal bed mobility: Needs Assistance Bed Mobility: Sit to Sidelying, Rolling Rolling: Min assist       Sit to sidelying: Min guard General bed mobility comments: assist to align shoulders in supine    Transfers Overall transfer level: Needs assistance Equipment used: Rolling walker (2 wheels) Transfers: Sit to/from Stand Sit to Stand: Min assist           General transfer comment: min A for steadying as pt with increased posterior LE bracing on recliner to come to standing      Balance Overall balance assessment: Mild deficits observed, not formally tested                                         ADL either performed or assessed with clinical judgement   ADL Overall ADL's : Needs assistance/impaired Eating/Feeding: Independent;Sitting   Grooming: Set up;Sitting   Upper Body Bathing: Minimal assistance;Sitting   Lower Body Bathing: Sit to/from stand;Moderate assistance   Upper Body Dressing : Set up;Sitting   Lower Body Dressing: Moderate assistance;Sit to/from stand   Toilet Transfer: Min guard;Ambulation;BSC/3in1;Rolling walker (2 wheels)   Toileting- Clothing Manipulation and Hygiene: Moderate assistance;Sit to/from stand       Functional mobility during ADLs: Min guard;Rolling walker (2 wheels)       Vision Ability to See in Adequate Light: 0 Adequate Patient Visual Report: No change from baseline       Perception  Praxis      Pertinent Vitals/Pain Pain Assessment Pain Assessment: Faces Faces Pain Scale: Hurts a little bit Pain Location: chest incision Pain Descriptors / Indicators: Grimacing,  Guarding, Moaning Pain Intervention(s): Monitored during session, Repositioned     Hand Dominance Right   Extremity/Trunk Assessment Upper Extremity Assessment Upper Extremity Assessment: Generalized weakness   Lower Extremity Assessment Lower Extremity Assessment: Defer to PT evaluation   Cervical / Trunk Assessment Cervical / Trunk Assessment: Kyphotic   Communication Communication Communication: No difficulties   Cognition Arousal/Alertness: Awake/alert Behavior During Therapy: WFL for tasks assessed/performed Overall Cognitive Status: Within Functional Limits for tasks assessed                                 General Comments: requires cuing for sternal precautions     General Comments  Pt on 3L O2 via [place] with SpO2 93%O2 in sitting, with ambulation SpO2 drops to 85%O2 when good pleth waveform present, increased to 4L O2 to resume ambulation, at end of session supin in bed able to return to 3L O2 and maintain SpO2 >96%O2, max noted HR with ambulation 88bpm    Exercises     Shoulder Instructions      Home Living Family/patient expects to be discharged to:: Private residence Living Arrangements: Spouse/significant other Available Help at Discharge: Family;Available 24 hours/day Type of Home: House Home Access: Stairs to enter [employer] of Steps: 4 Entrance Stairs-Rails: None Home Layout: Two level;Able to live on main level with bedroom/bathroom Alternate Level Stairs-Number of Steps: 12   Bathroom Shower/Tub: Producer, television/film/video: Standard Bathroom Accessibility: Yes How Accessible: Accessible via walker Home Equipment: Rollator (4 wheels);BSC/3in1;Shower seat;Shower seat - built in;Grab bars - tub/shower;Hand held shower head;Cane - single point          Prior Functioning/Environment Prior Level of Function : Independent/Modified Independent;Driving             Mobility Comments: driving, 3 weeks ago community  ambulation ADLs Comments: independent        OT Problem List: Decreased strength;Decreased activity tolerance;Impaired balance (sitting and/or standing);Decreased knowledge of precautions;Decreased knowledge of use of DME or AE;Cardiopulmonary status limiting activity;Impaired UE functional use      OT Treatment/Interventions: Self-care/ADL training;DME and/or AE instruction;Energy conservation;Therapeutic activities;Patient/family education;Balance training    OT Goals(Current goals can be found in the care plan section) Acute Rehab OT Goals OT Goal Formulation: With patient Time For Goal Achievement: 03/15/23 Potential to Achieve Goals: Good ADL Goals Pt Will Perform Grooming: with supervision;standing Pt Will Perform Lower Body Bathing: with min assist;sit to/from stand;with adaptive equipment Pt Will Perform Lower Body Dressing: with min assist;sit to/from stand Pt Will Transfer to Toilet: with supervision;ambulating;bedside commode Pt Will Perform Toileting - Clothing Manipulation and hygiene: with supervision;sitting/lateral leans Additional ADL Goal #1: Pt will generalize sternal precautions in ADLs and mobility. Additional ADL Goal #2: Pt will state at least 3 energy conservation strategies as instructed.  OT Frequency: Min 1X/week    Co-evaluation              AM-PAC OT "6 Clicks" Daily Activity     Outcome Measure Help from another person eating meals?: None Help from another person taking care of personal grooming?: A Little Help from another person toileting, which includes using toliet, bedpan, or urinal?: A Lot Help from another person bathing (including washing, rinsing, drying)?: A Lot Help from another person  to put on and taking off regular upper body clothing?: A Little Help from another person to put on and taking off regular lower body clothing?: A Lot 6 Click Score: 16   End of Session Equipment Utilized During Treatment: Rolling walker (2 wheels);Gait  belt;Oxygen (3-4 L)  Activity Tolerance: Patient tolerated treatment well Patient left: in bed;with call bell/phone within reach;with bed alarm set  OT Visit Diagnosis: Unsteadiness on feet (R26.81);Other abnormalities of gait and mobility (R26.89);Muscle weakness (generalized) (M62.81);Other (comment) (decreased activity tolerance)                Time: 1010-1042 OT Time Calculation (min): 32 min Charges:  OT General Charges $OT Visit: 1 Visit OT Evaluation $OT Eval Moderate Complexity: 1 Mod  Berna Spare, OTR/L Acute Rehabilitation Services Office: [phone]   Evern Bio 03/01/2023, 12:27 PM

## 2023-03-01 NOTE — Progress Notes (Signed)
CARDIAC REHAB PHASE I     Pt resting in bed, feeling well. Pt has ambulated x3 today in hallway. Pt reports tolerating well. Only complaint, feeling tired quickly. Encouraged to continue ambulation, oob to chair and IS use. Will continue to follow.  [id number]  Woodroe Chen, RN BSN 03/01/2023 2:35 PM

## 2023-03-01 NOTE — Care Management Important Message (Signed)
Important Message  Patient Details  Name: Bobby Bradley MRN: [id number] Date of Birth: 02/19/1946   Medicare Important Message Given:  Yes     Renie Ora 03/01/2023, 8:45 AM

## 2023-03-01 NOTE — Evaluation (Signed)
Physical Therapy Evaluation Patient Details Name: Bobby Bradley MRN: [id number] DOB: April 30, 1946 Today's Date: 03/01/2023  History of Present Illness  77 year old male presents 6/17 for surgical evaluation of three-vessel coronary artery disease.after 2 months of progressive DoE.  S/p CORONARY ARTERY BYPASS GRAFTING (CABG) X 4 WITH LEFT INTERNAL MAMMARY ARTERY HARVEST AND RIGHT GREATER SAPHENOUS VEIN HARVESTED ENDOSCOPICALLY PMH: permanent pacemaker placement, atrial fibrillation, and hypertension.  diabetes mellitus, TIAs.  Clinical Impression  PTA pt living with wife in multistory home with 4 steps to enter. Pt reports independence with ambulation, ADLs, and iADLs, and driving, however in last 2 months has been experiencing increased DoE. Pt is currently limited in safe mobility by surgical chest pain with movement, decreased carryover of sternal precautions, and increased oxygen demand in presence of generalized weakness and decreased endurance. Pt is currently min A for bed mobility and transfers and min guard for ambulation with RW. Pt will benefit from HHPT until he can go to cardiac rehab. Pt has desire to return to individual exercise program. PT and Mobility Specialist will continue to see pt acutely.        Recommendations for follow up therapy are one component of a multi-disciplinary discharge planning process, led by the attending physician.  Recommendations may be updated based on patient status, additional functional criteria and insurance authorization.     Assistance Recommended at Discharge Frequent or constant Supervision/Assistance  Patient can return home with the following  A little help with walking and/or transfers;A little help with bathing/dressing/bathroom;Assistance with cooking/housework;Assist for transportation;Help with stairs or ramp for entrance    Equipment Recommendations None recommended by PT (has necessary equipment)     Functional Status Assessment  Patient has had a recent decline in their functional status and demonstrates the ability to make significant improvements in function in a reasonable and predictable amount of time.     Precautions / Restrictions Precautions Precautions: Sternal Precaution Comments: pt able to verbalize all precautions however in practice needs reminders for reaching posteriorly and pushing up outside the tube Restrictions Weight Bearing Restrictions: Yes Other Position/Activity Restrictions: sternal precaution      Mobility  Bed Mobility Overal bed mobility: Needs Assistance Bed Mobility: Sit to Supine       Sit to supine: Min assist   General bed mobility comments: able to perform good technique to come to sidelying and bring LE back into bed, requires min A for squaring trunk    Transfers Overall transfer level: Needs assistance Equipment used: Rolling walker (2 wheels) Transfers: Sit to/from Stand Sit to Stand: Min assist           General transfer comment: min A for steadying as pt with increased posterior LE bracing on recliner to come to standing    Ambulation/Gait Ambulation/Gait assistance: Min guard Gait Distance (Feet): 180 Feet Assistive device: Rolling walker (2 wheels) Gait Pattern/deviations: Step-through pattern, Trunk flexed Gait velocity: slow for pt Gait velocity interpretation: 1.31 - 2.62 ft/sec, indicative of limited community ambulator   General Gait Details: min guard for safety, 1x standing rest break to check SpO2 pleth wave form, requiring increased from 3-4L O2 via [place] to complete ambulation     Balance Overall balance assessment: Mild deficits observed, not formally tested                                           Pertinent Vitals/Pain  Pain Assessment Pain Assessment: Faces Faces Pain Scale: Hurts a little bit Pain Location: chest Pain Descriptors / Indicators: Grimacing, Guarding, Moaning Pain Intervention(s): Limited activity  within patient's tolerance, Monitored during session, Repositioned    Home Living Family/patient expects to be discharged to:: Private residence Living Arrangements: Spouse/significant other Available Help at Discharge: Family;Available 24 hours/day Type of Home: House Home Access: Stairs to enter Entrance Stairs-Rails: None Entrance Stairs-Number of Steps: 4 Alternate Level Stairs-Number of Steps: 12 Home Layout: Two level;Able to live on main level with bedroom/bathroom Home Equipment: Rollator (4 wheels);BSC/3in1;Shower seat;Shower seat - built in;Grab bars - tub/shower;Hand held shower head;Cane - single point      Prior Function Prior Level of Function : Independent/Modified Independent             Mobility Comments: driving, 3 weeks ago community ambulation ADLs Comments: independent        Extremity/Trunk Assessment   Upper Extremity Assessment Upper Extremity Assessment: Defer to OT evaluation    Lower Extremity Assessment Lower Extremity Assessment: Overall WFL for tasks assessed (R LE vein harvest site intact)    Cervical / Trunk Assessment Cervical / Trunk Assessment: Kyphotic  Communication   Communication: No difficulties  Cognition Arousal/Alertness: Awake/alert Behavior During Therapy: WFL for tasks assessed/performed Overall Cognitive Status: Within Functional Limits for tasks assessed                                 General Comments: requires cuing for sternal precautions        General Comments General comments (skin integrity, edema, etc.): Pt on 3L O2 via [place] with SpO2 93%O2 in sitting, with ambulation SpO2 drops to 85%O2 when good pleth waveform present, increased to 4L O2 to resume ambulation, at end of session supin in bed able to return to 3L O2 and maintain SpO2 >96%O2, max noted HR with ambulation 88bpm        Assessment/Plan    PT Assessment Patient needs continued PT services  PT Problem List Decreased  strength;Decreased activity tolerance;Decreased balance;Decreased mobility;Decreased range of motion;Decreased knowledge of precautions;Cardiopulmonary status limiting activity;Pain       PT Treatment Interventions DME instruction;Gait training;Stair training;Functional mobility training;Therapeutic activities;Therapeutic exercise;Balance training;Cognitive remediation;Patient/family education    PT Goals (Current goals can be found in the Care Plan section)  Acute Rehab PT Goals Patient Stated Goal: get back to exercise PT Goal Formulation: With patient Time For Goal Achievement: 03/15/23 Potential to Achieve Goals: Good    Frequency Min 1X/week        AM-PAC PT "6 Clicks" Mobility  Outcome Measure Help needed turning from your back to your side while in a flat bed without using bedrails?: None Help needed moving from lying on your back to sitting on the side of a flat bed without using bedrails?: A Little Help needed moving to and from a bed to a chair (including a wheelchair)?: A Little Help needed standing up from a chair using your arms (e.g., wheelchair or bedside chair)?: A Little Help needed to walk in hospital room?: A Little Help needed climbing 3-5 steps with a railing? : A Lot 6 Click Score: 18    End of Session Equipment Utilized During Treatment: Gait belt;Oxygen Activity Tolerance: Patient tolerated treatment well Patient left: in bed;with call bell/phone within reach;with bed alarm set Nurse Communication: Mobility status PT Visit Diagnosis: Muscle weakness (generalized) (M62.81);Other abnormalities of gait and mobility (R26.89);Difficulty in walking,  not elsewhere classified (R26.2);Pain Pain - part of body:  (chest)    Time: 1010-1039 PT Time Calculation (min) (ACUTE ONLY): 29 min   Charges:   PT Evaluation $PT Eval Moderate Complexity: 1 Mod          Briea Mcenery B. Beverely Risen PT, DPT Acute Rehabilitation Services Please use secure chat or  Call  Office [phone]   Bobby Bradley [facility] 03/01/2023, 11:20 AM

## 2023-03-01 NOTE — Anesthesia Postprocedure Evaluation (Signed)
Anesthesia Post Note  Patient: Bobby Bradley  Procedure(s) Performed: CORONARY ARTERY BYPASS GRAFTING (CABG) X 4 WITH LEFT INTERNAL MAMMARY ARTERY HARVEST AND RIGHT GREATER SAPHENOUS VEIN HARVESTED ENDOSCOPICALLY (Chest) CLIPPING OF LEFT ATRIAL APPENDAGE TRANSESOPHAGEAL ECHOCARDIOGRAM     Patient location during evaluation: SICU Anesthesia Type: General Level of consciousness: sedated Pain management: pain level controlled Vital Signs Assessment: post-procedure vital signs reviewed and stable Respiratory status: patient remains intubated per anesthesia plan Cardiovascular status: stable Postop Assessment: no apparent nausea or vomiting Anesthetic complications: no  No notable events documented.  Last Vitals:  Vitals:   03/01/23 0337 03/01/23 0500  BP: (!) 96/58   Pulse: 64 (!) 56  Resp: 20 (!) 24  Temp: 36.5 C   SpO2: 95% 94%    Last Pain:  Vitals:   03/01/23 0337  TempSrc: Oral  PainSc: 0-No pain                 Hanh Kertesz S

## 2023-03-01 NOTE — Assessment & Plan Note (Signed)
LIMA to LAD, SVG to OM, SVG to PDA, SVG to Diagonal

## 2023-03-02 LAB — GLUCOSE, CAPILLARY
Glucose-Capillary: 148 mg/dL — ABNORMAL HIGH (ref 70–99)
Glucose-Capillary: 171 mg/dL — ABNORMAL HIGH (ref 70–99)
Glucose-Capillary: 150 mg/dL — ABNORMAL HIGH (ref 70–99)
Glucose-Capillary: 134 mg/dL — ABNORMAL HIGH (ref 70–99)

## 2023-03-02 LAB — CBC
HCT: 33.5 % — ABNORMAL LOW (ref 39.0–52.0)
Hemoglobin: 11.4 g/dL — ABNORMAL LOW (ref 13.0–17.0)
MCH: 30.6 pg (ref 26.0–34.0)
MCHC: 34 g/dL (ref 30.0–36.0)
MCV: 89.8 fL (ref 80.0–100.0)
Platelets: 218 10*3/uL (ref 150–400)
RBC: 3.73 MIL/uL — ABNORMAL LOW (ref 4.22–5.81)
RDW: 13.9 % (ref 11.5–15.5)
WBC: 15.6 10*3/uL — ABNORMAL HIGH (ref 4.0–10.5)
nRBC: 0 % (ref 0.0–0.2)

## 2023-03-02 MED ORDER — CARVEDILOL 12.5 MG PO TABS
12.5000 mg | ORAL_TABLET | Freq: Two times a day (BID) | ORAL | Status: DC
  Administered 2023-03-02 – 2023-03-03 (×2): 12.5 mg via ORAL
  Filled 2023-03-02 (×2): qty 1

## 2023-03-02 MED ORDER — BISACODYL 5 MG PO TBEC: 10.0000 mg | DELAYED_RELEASE_TABLET | Freq: Every day | ORAL | Status: DC | PRN

## 2023-03-02 MED ORDER — DOCUSATE SODIUM 100 MG PO CAPS: 200.0000 mg | ORAL_CAPSULE | Freq: Every day | ORAL | Status: DC | PRN

## 2023-03-02 MED ORDER — AMIODARONE HCL 200 MG PO TABS
400.0000 mg | ORAL_TABLET | Freq: Two times a day (BID) | ORAL | Status: DC
  Administered 2023-03-02 – 2023-03-03 (×3): 400 mg via ORAL
  Filled 2023-03-02 (×3): qty 2

## 2023-03-02 MED ORDER — POTASSIUM CHLORIDE CRYS ER 20 MEQ PO TBCR: 20.0000 meq | EXTENDED_RELEASE_TABLET | Freq: Every day | ORAL | Status: DC

## 2023-03-02 MED ORDER — METFORMIN HCL 500 MG PO TABS
500.0000 mg | ORAL_TABLET | Freq: Two times a day (BID) | ORAL | Status: DC
  Administered 2023-03-02 – 2023-03-03 (×2): 500 mg via ORAL
  Filled 2023-03-02 (×2): qty 1

## 2023-03-02 MED ORDER — FUROSEMIDE 40 MG PO TABS: 40.0000 mg | ORAL_TABLET | Freq: Every day | ORAL | Status: DC

## 2023-03-02 MED ORDER — SPIRONOLACTONE 25 MG PO TABS
25.0000 mg | ORAL_TABLET | Freq: Every day | ORAL | Status: DC
  Administered 2023-03-02 – 2023-03-03 (×2): 25 mg via ORAL
  Filled 2023-03-02 (×2): qty 1

## 2023-03-02 MED ORDER — BISACODYL 10 MG RE SUPP: 10.0000 mg | Freq: Every day | RECTAL | Status: DC | PRN

## 2023-03-02 NOTE — Progress Notes (Signed)
Physical Therapy Treatment Patient Details Name: Bobby Bradley MRN: [id number] DOB: 1946-04-08 Today's Date: 03/02/2023   History of Present Illness 77 year old male presents 6/17 for surgical evaluation of three-vessel coronary artery disease.after 2 months of progressive DoE.  S/p CORONARY ARTERY BYPASS GRAFTING (CABG) X 4 WITH LEFT INTERNAL MAMMARY ARTERY HARVEST AND RIGHT GREATER SAPHENOUS VEIN HARVESTED ENDOSCOPICALLY PMH: permanent pacemaker placement, atrial fibrillation, and hypertension.  diabetes mellitus, TIAs.    PT Comments    Pt received in chair, agreeable to therapy session and with good participation and tolerance for transfer, gait and stair training. Pt needing mostly Supervision for safety/sternal precaution compliance and needs up to minA for stair ascent/descent with L handrail and spouse given teachback on guarding/assisting him up/down stairs. Pt continues to benefit from PT services to progress toward functional mobility goals.   Recommendations for follow up therapy are one component of a multi-disciplinary discharge planning process, led by the attending physician.  Recommendations may be updated based on patient status, additional functional criteria and insurance authorization.  Follow Up Recommendations       Assistance Recommended at Discharge Frequent or constant Supervision/Assistance  Patient can return home with the following A little help with walking and/or transfers;A little help with bathing/dressing/bathroom;Assistance with cooking/housework;Assist for transportation;Help with stairs or ramp for entrance   Equipment Recommendations  None recommended by PT (has DME already)    Recommendations for Other Services       Precautions / Restrictions Precautions Precautions: Fall;Sternal Precaution Comments: reinforced sternal precautions and I ADLs to avoid Restrictions Weight Bearing Restrictions: No RUE Weight Bearing: Non weight bearing LUE  Weight Bearing: Non weight bearing Other Position/Activity Restrictions: sternal precautions     Mobility  Bed Mobility Overal bed mobility: Needs Assistance             General bed mobility comments: pt received in recliner, agreeable to remain in chair    Transfers Overall transfer level: Needs assistance Equipment used: Rolling walker (2 wheels) Transfers: Sit to/from Stand Sit to Stand: Supervision           General transfer comment: cues not to wing elbows out when pushing with arms on knees when standing    Ambulation/Gait Ambulation/Gait assistance: Supervision Gait Distance (Feet): 225 Feet Assistive device: Rolling walker (2 wheels) Gait Pattern/deviations: Step-through pattern, Trunk flexed       General Gait Details: SpO2 WFL on RA throughout, HR WFL 95-99's bpm with exertion   Stairs Stairs: Yes Stairs assistance: Min assist Stair Management: One rail Left, Step to pattern, Forwards Number of Stairs: 4 General stair comments: cues for "up with good leg, down with sore leg", pt on one occasion stepped down with incorrect leg and needed cues for sequencing each step    Balance Overall balance assessment: Mild deficits observed, not formally tested                                          Cognition Arousal/Alertness: Awake/alert Behavior During Therapy: WFL for tasks assessed/performed Overall Cognitive Status: Within Functional Limits for tasks assessed                                 General Comments: min cues for sternal precs during gait and various tasks (pt attempting to push door open, needs reminder not to push with  elbow away from his body)        Exercises Other Exercises Other Exercises: encouraged ankle pumps hourly x10 reps    General Comments General comments (skin integrity, edema, etc.): SpO2 WFL on RA, HR WFL.      Pertinent Vitals/Pain Pain Assessment Pain Assessment: No/denies pain Pain  Intervention(s): Monitored during session, Repositioned     PT Goals (current goals can now be found in the care plan section) Acute Rehab PT Goals Patient Stated Goal: get back to exercise PT Goal Formulation: With patient Time For Goal Achievement: 03/15/23 Progress towards PT goals: Progressing toward goals    Frequency    Min 1X/week      PT Plan Current plan remains appropriate       AM-PAC PT "6 Clicks" Mobility   Outcome Measure  Help needed turning from your back to your side while in a flat bed without using bedrails?: None Help needed moving from lying on your back to sitting on the side of a flat bed without using bedrails?: A Little Help needed moving to and from a bed to a chair (including a wheelchair)?: A Little Help needed standing up from a chair using your arms (e.g., wheelchair or bedside chair)?: A Little Help needed to walk in hospital room?: A Little Help needed climbing 3-5 steps with a railing? : A Little 6 Click Score: 19    End of Session Equipment Utilized During Treatment: Gait belt Activity Tolerance: Patient tolerated treatment well Patient left: in chair;with call bell/phone within reach;with family/visitor present (spouse in room) Nurse Communication: Mobility status PT Visit Diagnosis: Muscle weakness (generalized) (M62.81);Other abnormalities of gait and mobility (R26.89);Difficulty in walking, not elsewhere classified (R26.2);Pain     Time: 1610-9604 PT Time Calculation (min) (ACUTE ONLY): 18 min  Charges:  $Gait Training: 8-22 mins                     Bobby Bradley P., PTA Acute Rehabilitation Services Secure Chat Preferred 9a-5:30pm Office: [phone]    Bobby Bradley [facility] 03/02/2023, 4:46 PM

## 2023-03-02 NOTE — Progress Notes (Signed)
Mobility Specialist Progress Note:    03/02/23 1419  Mobility  Activity Ambulated with assistance in hallway  Level of Assistance Contact guard assist, steadying assist  Assistive Device Front wheel walker  Distance Ambulated (ft) 450 ft  RUE Weight Bearing NWB  LUE Weight Bearing NWB  Activity Response Tolerated well  Mobility Referral Yes  $Mobility charge 1 Mobility  Mobility Specialist Start Time (ACUTE ONLY) 1158  Mobility Specialist Stop Time (ACUTE ONLY) 1213  Mobility Specialist Time Calculation (min) (ACUTE ONLY) 15 min   Received pt in chair having no complaints and agreeable to mobility. Pt was asymptomatic throughout ambulation and returned to room w/o fault. Left in chair  w/ call bell in reach and all needs met.   Thompson Grayer Mobility Specialist  Please contact vis Secure Chat or  Rehab Office [phone]

## 2023-03-02 NOTE — Progress Notes (Signed)
CARDIAC REHAB PHASE I   PRE:  Rate/Rhythm: 69 a-fib  BP:  Sitting: 117/60      SaO2: 96 RA  MODE:  Ambulation: 400 ft   POST:  Rate/Rhythm: 78 a-fib  BP:  Sitting: 131/82      SaO2: 97 RA   Pt sitting in chair, feeling well today. Ambulated in hall, using front wheel walker, moving at slow steady pace. Pt utilizing proper sternal precautions without cues. Tolerated well with no pain, dizziness and mild SOB towards end of walk. Returned to chair with call bell and bedside table in reach. Post [facility] education including site care, restrictions, risk factors, exercise guidelines, heart healthy diabetic diet, sternal precautions, IS use at home, home needs at discharge and CRP2 reviewed. All questions and concerns addressed. Will refer to [facility] for CRP2. Pt has all DME needed for discharge. Will continue to follow.   1610-9604  Woodroe Chen, RN BSN 03/02/2023 11:45 AM

## 2023-03-02 NOTE — Progress Notes (Signed)
Occupational Therapy Treatment Patient Details Name: Bobby Bradley MRN: [id number] DOB: April 15, 1946 Today's Date: 03/02/2023   History of present illness 77 year old male presents 6/17 for surgical evaluation of three-vessel coronary artery disease.after 2 months of progressive DoE.  S/p CORONARY ARTERY BYPASS GRAFTING (CABG) X 4 WITH LEFT INTERNAL MAMMARY ARTERY HARVEST AND RIGHT GREATER SAPHENOUS VEIN HARVESTED ENDOSCOPICALLY PMH: permanent pacemaker placement, atrial fibrillation, and hypertension.  diabetes mellitus, TIAs.   OT comments  Focus of session on educating pt and wife in energy conservation strategies and importance of elevating all sitting surfaces for ease in standing including use of 3 in 1 over toilet. Instructed in use of AE for LB ADLs, pt prefers to rely on his wife to assist until he can return to doing himself. Educated in IADLs to avoid. Pt and wife verbalizing understanding. Updated d/c recommendation.    Recommendations for follow up therapy are one component of a multi-disciplinary discharge planning process, led by the attending physician.  Recommendations may be updated based on patient status, additional functional criteria and insurance authorization.    Assistance Recommended at Discharge Frequent or constant Supervision/Assistance  Patient can return home with the following  A little help with walking and/or transfers;A lot of help with bathing/dressing/bathroom;Assistance with cooking/housework;Assist for transportation;Help with stairs or ramp for entrance   Equipment Recommendations  None recommended by OT    Recommendations for Other Services      Precautions / Restrictions Precautions Precautions: Fall;Sternal Precaution Comments: reinforced sternal precautions and I ADLs to avoid Restrictions Weight Bearing Restrictions: No RUE Weight Bearing: Non weight bearing LUE Weight Bearing: Non weight bearing       Mobility Bed Mobility Overal bed  mobility: Needs Assistance Bed Mobility: Sit to Sidelying, Rolling         Sit to sidelying: Supervision      Transfers Overall transfer level: Needs assistance Equipment used: Rolling walker (2 wheels) Transfers: Sit to/from Stand Sit to Stand: Min guard                 Balance                                           ADL either performed or assessed with clinical judgement   ADL Overall ADL's : Needs assistance/impaired             Lower Body Bathing: Minimal assistance;Sitting/lateral leans Lower Body Bathing Details (indicate cue type and reason): educated in use of long handled bath spong and reacher     Lower Body Dressing: Moderate assistance;Sit to/from stand Lower Body Dressing Details (indicate cue type and reason): educated in use and availablility of AE for LB dressing       Toileting - Clothing Manipulation Details (indicate cue type and reason): instructed in use of 3 in 1 over toilet   Tub/Shower Transfer Details (indicate cue type and reason): recommended seated showering Functional mobility during ADLs: Min guard;Rolling walker (2 wheels) General ADL Comments: Educated pt and wife in energy conservation strategies and reinforced with handout.    Extremity/Trunk Assessment              Vision       Perception     Praxis      Cognition Arousal/Alertness: Awake/alert Behavior During Therapy: WFL for tasks assessed/performed Overall Cognitive Status: Within Functional Limits for tasks assessed  Exercises      Shoulder Instructions       General Comments      Pertinent Vitals/ Pain       Pain Assessment Pain Assessment: No/denies pain  Home Living                                          Prior Functioning/Environment              Frequency  Min 1X/week        Progress Toward Goals  OT Goals(current goals can  now be found in the care plan section)  Progress towards OT goals: Progressing toward goals  Acute Rehab OT Goals OT Goal Formulation: With patient Time For Goal Achievement: 03/15/23 Potential to Achieve Goals: Good  Plan Discharge plan needs to be updated    Co-evaluation                 AM-PAC OT "6 Clicks" Daily Activity     Outcome Measure   Help from another person eating meals?: None Help from another person taking care of personal grooming?: A Little Help from another person toileting, which includes using toliet, bedpan, or urinal?: A Little Help from another person bathing (including washing, rinsing, drying)?: A Little Help from another person to put on and taking off regular upper body clothing?: A Lot Help from another person to put on and taking off regular lower body clothing?: A Lot 6 Click Score: 17    End of Session Equipment Utilized During Treatment: Rolling walker (2 wheels);Gait belt  OT Visit Diagnosis: Unsteadiness on feet (R26.81);Other abnormalities of gait and mobility (R26.89);Muscle weakness (generalized) (M62.81);Other (comment)   Activity Tolerance Patient tolerated treatment well   Patient Left in bed;with call bell/phone within reach;with family/visitor present   Nurse Communication          Time: 1350-1410 OT Time Calculation (min): 20 min  Charges: OT General Charges $OT Visit: 1 Visit OT Treatments $Self Care/Home Management : 8-22 mins  Berna Spare, OTR/L Acute Rehabilitation Services Office: [phone]   Evern Bio 03/02/2023, 2:54 PM

## 2023-03-02 NOTE — Progress Notes (Addendum)
[address]       [place] [address]             [phone]      4 Days Post-Op Procedure(s) (LRB): CORONARY ARTERY BYPASS GRAFTING (CABG) X 4 WITH LEFT INTERNAL MAMMARY ARTERY HARVEST AND RIGHT GREATER SAPHENOUS VEIN HARVESTED ENDOSCOPICALLY (N/A) CLIPPING OF LEFT ATRIAL APPENDAGE (N/A) TRANSESOPHAGEAL ECHOCARDIOGRAM (N/A) Subjective: Pt states he has had multiple bowel movements, otherwise has no new complaints.  Objective: Vital signs in last 24 hours: Temp:  [97.7 F (36.5 C)-98.5 F (36.9 C)] 98.4 F (36.9 C) (06/21 0407) Pulse Rate:  [61-78] 65 (06/21 0407) Cardiac Rhythm: Atrial fibrillation (06/20 1930) Resp:  [20-22] 20 (06/21 0407) BP: (102-132)/(48-76) 102/48 (06/21 0407) SpO2:  [95 %-97 %] 95 % (06/21 0407) Weight:  [111.9 kg] 111.9 kg (06/21 0407)  Hemodynamic parameters for last 24 hours:    Intake/Output from previous day: 06/20 0701 - 06/21 0700 In: 651.4 [P.O.:300; I.V.:351.4] Out: 2000 [Urine:2000] Intake/Output this shift: No intake/output data recorded.  General appearance: alert, cooperative, and no distress Neurologic: intact Heart: irregularly irregular, no murmur Lungs: clear to auscultation bilaterally Abdomen: soft, non-tender; bowel sounds normal; no masses,  no organomegaly Extremities: edema trace Wound: Clean and dry without sign of infection  Lab Results: Recent Labs    03/01/23 0138 03/02/23 0144  WBC 16.2* 15.6*  HGB 11.7* 11.4*  HCT 34.5* 33.5*  PLT 177 218   BMET:  Recent Labs    02/28/23 0438 03/01/23 0138  NA 132* 131*  K 4.4 4.8  CL 99 102  CO2 24 22  GLUCOSE 140* 150*  BUN 14 17  CREATININE 1.06 1.10  CALCIUM 8.0* 7.9*    PT/INR: No results for input(s): "LABPROT", "INR" in the last 72 hours. ABG    Component Value Date/Time   PHART 7.327 (L) 02/26/2023 1919   HCO3 22.4 02/26/2023 1919   TCO2 24 02/26/2023 1919   ACIDBASEDEF 3.0 (H) 02/26/2023 1919   O2SAT 97 02/26/2023 1919    CBG (last 3)  Recent Labs    03/01/23 1627 03/01/23 2031 03/02/23 0614  GLUCAP 146* 127* 148*    Assessment/Plan: S/P Procedure(s) (LRB): CORONARY ARTERY BYPASS GRAFTING (CABG) X 4 WITH LEFT INTERNAL MAMMARY ARTERY HARVEST AND RIGHT GREATER SAPHENOUS VEIN HARVESTED ENDOSCOPICALLY (N/A) CLIPPING OF LEFT ATRIAL APPENDAGE (N/A) TRANSESOPHAGEAL ECHOCARDIOGRAM (N/A)  CV: Pt with PPM. Hx of paroxysmal atrial fibrillation. Pt converted to atrial fibrillation 06/19. On Amiodarone drip, still in atrial fibrillation with controlled rate, HR 60-70s. Pt is asymptomatic. SBP 102. Pt was on Coreg 25mg  BID prior to surgery, will d/c Lopressor and restart Coreg 12.5mg  BID. As discussed with Dr. Cliffton Asters will transition to PO Amiodarone. Eliquis restarted for hx of stroke and atrial fibrillation.    Pulm: Saturating 95% on RA. CXR with stable bibasilar atelectasis greater at the left base and left sided pleural effusion. Continue IS, flutter valve and ambulation.   GI: +BM yesterday. OK diet, no nausea or vomiting. Multiple bowel movements, will transition stool softeners to PRN.   Endo: CBGs controlled 146/127/148. On home Metformin 500mg  BID. Preop A1C 6.5, will d/c SSI and finger sticks.    ID: Leukocytosis stable/trending down, WBC 15.6 Tmax 98.5. Likely reactive, possibly due to atelectasis, continue to monitor. Pt denies cough and urinary complaints, incisions with no sign of infection.   Renal: Last Cr stable, bmet this am hemolyzed. Good UO 2000cc/24hrs after IV Lasix yesterday. Pt incontinent prior  to surgery, external catheter in place. Weight +4lbs from preop. Will restart home Spironolactone.  Hyponatremia: Mild and stable Na 131, likely due to diuresis.    Expected postop ABLA: Stable, H/H 11.4/33.5. Continue to monitor.  Deconditioning: PT/OT recommended home health.     Dispo: If pt's HR remains stable on PO Amiodarone plan to d/c home tomorrow   LOS: 4 days    Jenny Reichmann, PA-C 03/02/2023    Agree with above Transitioning to p.o. amiodarone. Home likely tomorrow  Corliss Skains

## 2023-03-03 ENCOUNTER — Other Ambulatory Visit: Payer: Self-pay | Admitting: Physician Assistant

## 2023-03-03 LAB — CBC
HCT: 32.9 % — ABNORMAL LOW (ref 39.0–52.0)
Hemoglobin: 11 g/dL — ABNORMAL LOW (ref 13.0–17.0)
MCH: 30.1 pg (ref 26.0–34.0)
MCHC: 33.4 g/dL (ref 30.0–36.0)
MCV: 90.1 fL (ref 80.0–100.0)
Platelets: 224 10*3/uL (ref 150–400)
RBC: 3.65 MIL/uL — ABNORMAL LOW (ref 4.22–5.81)
RDW: 14.1 % (ref 11.5–15.5)
WBC: 12.6 10*3/uL — ABNORMAL HIGH (ref 4.0–10.5)
nRBC: 0 % (ref 0.0–0.2)

## 2023-03-03 LAB — GLUCOSE, CAPILLARY: Glucose-Capillary: 121 mg/dL — ABNORMAL HIGH (ref 70–99)

## 2023-03-03 LAB — BASIC METABOLIC PANEL
Anion gap: 11 (ref 5–15)
BUN: 22 mg/dL (ref 8–23)
CO2: 23 mmol/L (ref 22–32)
Calcium: 8.1 mg/dL — ABNORMAL LOW (ref 8.9–10.3)
Chloride: 100 mmol/L (ref 98–111)
Creatinine, Ser: 1.13 mg/dL (ref 0.61–1.24)
GFR, Estimated: >60 mL/min (ref >60)
Glucose, Bld: 149 mg/dL — ABNORMAL HIGH (ref 70–99)
Potassium: 3.8 mmol/L (ref 3.5–5.1)
Sodium: 134 mmol/L — ABNORMAL LOW (ref 135–145)

## 2023-03-03 MED ORDER — CARVEDILOL 12.5 MG PO TABS: 12.5000 mg | ORAL_TABLET | Freq: Two times a day (BID) | ORAL | 1 refills | Status: DC

## 2023-03-03 MED ORDER — OXYCODONE HCL 5 MG PO TABS: 5.0000 mg | ORAL_TABLET | Freq: Four times a day (QID) | ORAL | 0 refills | Status: DC | PRN

## 2023-03-03 MED ORDER — AMIODARONE HCL 200 MG PO TABS: ORAL_TABLET | ORAL | 1 refills | Status: DC

## 2023-03-03 NOTE — Plan of Care (Signed)
  Problem: Education: Goal: Understanding of CV disease, CV risk reduction, and recovery process will improve Outcome: Progressing Goal: Individualized Educational Video(s) Outcome: Progressing   Problem: Activity: Goal: Ability to return to baseline activity level will improve Outcome: Progressing   Problem: Cardiovascular: Goal: Ability to achieve and maintain adequate cardiovascular perfusion will improve Outcome: Progressing Goal: Vascular access site(s) Level 0-1 will be maintained Outcome: Progressing   Problem: Health Behavior/Discharge Planning: Goal: Ability to safely manage health-related needs after discharge will improve Outcome: Progressing   Problem: Education: Goal: Knowledge of General Education information will improve Description: Including pain rating scale, medication(s)/side effects and non-pharmacologic comfort measures Outcome: Progressing   Problem: Health Behavior/Discharge Planning: Goal: Ability to manage health-related needs will improve Outcome: Progressing   Problem: Clinical Measurements: Goal: Ability to maintain clinical measurements within normal limits will improve Outcome: Progressing Goal: Will remain free from infection Outcome: Progressing Goal: Diagnostic test results will improve Outcome: Progressing Goal: Respiratory complications will improve Outcome: Progressing Goal: Cardiovascular complication will be avoided Outcome: Progressing   Problem: Activity: Goal: Risk for activity intolerance will decrease Outcome: Progressing   Problem: Nutrition: Goal: Adequate nutrition will be maintained Outcome: Progressing   Problem: Coping: Goal: Level of anxiety will decrease Outcome: Progressing   Problem: Elimination: Goal: Will not experience complications related to bowel motility Outcome: Progressing Goal: Will not experience complications related to urinary retention Outcome: Progressing   Problem: Pain Managment: Goal:  General experience of comfort will improve Outcome: Progressing   Problem: Safety: Goal: Ability to remain free from injury will improve Outcome: Progressing   Problem: Skin Integrity: Goal: Risk for impaired skin integrity will decrease Outcome: Progressing   Problem: Education: Goal: Will demonstrate proper wound care and an understanding of methods to prevent future damage Outcome: Progressing Goal: Knowledge of disease or condition will improve Outcome: Progressing Goal: Knowledge of the prescribed therapeutic regimen will improve Outcome: Progressing Goal: Individualized Educational Video(s) Outcome: Progressing   Problem: Activity: Goal: Risk for activity intolerance will decrease Outcome: Progressing   Problem: Cardiac: Goal: Will achieve and/or maintain hemodynamic stability Outcome: Progressing   Problem: Clinical Measurements: Goal: Postoperative complications will be avoided or minimized Outcome: Progressing   Problem: Respiratory: Goal: Respiratory status will improve Outcome: Progressing   Problem: Skin Integrity: Goal: Wound healing without signs and symptoms of infection Outcome: Progressing Goal: Risk for impaired skin integrity will decrease Outcome: Progressing   Problem: Urinary Elimination: Goal: Ability to achieve and maintain adequate renal perfusion and functioning will improve Outcome: Progressing   Problem: Education: Goal: Ability to describe self-care measures that may prevent or decrease complications (Diabetes Survival Skills Education) will improve Outcome: Progressing Goal: Individualized Educational Video(s) Outcome: Progressing   Problem: Coping: Goal: Ability to adjust to condition or change in health will improve Outcome: Progressing   Problem: Fluid Volume: Goal: Ability to maintain a balanced intake and output will improve Outcome: Progressing   Problem: Health Behavior/Discharge Planning: Goal: Ability to identify and  utilize available resources and services will improve Outcome: Progressing Goal: Ability to manage health-related needs will improve Outcome: Progressing   Problem: Metabolic: Goal: Ability to maintain appropriate glucose levels will improve Outcome: Progressing   Problem: Nutritional: Goal: Maintenance of adequate nutrition will improve Outcome: Progressing Goal: Progress toward achieving an optimal weight will improve Outcome: Progressing   Problem: Skin Integrity: Goal: Risk for impaired skin integrity will decrease Outcome: Progressing   Problem: Tissue Perfusion: Goal: Adequacy of tissue perfusion will improve Outcome: Progressing   

## 2023-03-03 NOTE — Progress Notes (Signed)
Nsg Discharge Note  Admit Date:  02/26/2023 Discharge date: 03/03/2023   Bobby Bradley to be D/C'd Home per MD order.  AVS completed.  Patient/caregiver able to verbalize understanding.  Discharge Medication: Allergies as of 03/03/2023       Reactions   Lisinopril    Extreme diarrhea        Medication List     STOP taking these medications    amLODipine 10 MG tablet Commonly known as: NORVASC   levofloxacin 750 MG tablet Commonly known as: LEVAQUIN   losartan 100 MG tablet Commonly known as: COZAAR       TAKE these medications    amiodarone 200 MG tablet Commonly known as: PACERONE Take 2 tablets twice per day for 7 days, then take 1 tablet twice per day for 7 days, then take 1 tablet once per day thereafter   apixaban 5 MG Tabs tablet Commonly known as: Eliquis TAKE 1 TABLET(5 MG) BY MOUTH TWICE DAILY   aspirin EC 81 MG tablet Take 81 mg by mouth daily. Swallow whole.   carvedilol 12.5 MG tablet Commonly known as: COREG Take 1 tablet (12.5 mg total) by mouth 2 (two) times daily with a meal. What changed:  medication strength See the new instructions.   cetirizine 10 MG tablet Commonly known as: ZYRTEC Take 10 mg by mouth daily.   ferrous sulfate 325 (65 FE) MG tablet Take 1 tablet (325 mg total) by mouth daily with breakfast.   metFORMIN 500 MG tablet Commonly known as: GLUCOPHAGE TAKE 1 TABLET(500 MG) BY MOUTH TWICE DAILY WITH A MEAL   MULTI VITAMIN DAILY PO Take 1 tablet by mouth daily.   oxyCODONE 5 MG immediate release tablet Commonly known as: Oxy IR/ROXICODONE Take 1 tablet (5 mg total) by mouth every 6 (six) hours as needed for severe pain.   rosuvastatin 40 MG tablet Commonly known as: CRESTOR Take 1 tablet (40 mg total) by mouth daily.   spironolactone 25 MG tablet Commonly known as: ALDACTONE Take 1 tablet (25 mg total) by mouth daily.        Discharge Assessment: Vitals:   03/03/23 0333 03/03/23 0914  BP: 128/67 (!)  117/90  Pulse: 66 69  Resp: 17 20  Temp: 98 F (36.7 C) 97.7 F (36.5 C)  SpO2: 95% 95%   Skin clean, dry and intact without evidence of skin break down, no evidence of skin tears noted. IV catheter discontinued intact. Site without signs and symptoms of complications - no redness or edema noted at insertion site, patient denies c/o pain - only slight tenderness at site.  Dressing with slight pressure applied.  D/c Instructions-Education: Discharge instructions given to patient/family with verbalized understanding. D/c education completed with patient/family including follow up instructions, medication list, d/c activities limitations if indicated, with other d/c instructions as indicated by MD - patient able to verbalize understanding, all questions fully answered. Patient instructed to return to ED, call 911, or call MD for any changes in condition.  Patient escorted via WC, and D/C home via private auto.  Kizzie Bane, RN 03/03/2023 11:08 AM

## 2023-03-03 NOTE — Progress Notes (Signed)
      [address]       [place] [address]             [phone]      5 Days Post-Op Procedure(s) (LRB): CORONARY ARTERY BYPASS GRAFTING (CABG) X 4 WITH LEFT INTERNAL MAMMARY ARTERY HARVEST AND RIGHT GREATER SAPHENOUS VEIN HARVESTED ENDOSCOPICALLY (N/A) CLIPPING OF LEFT ATRIAL APPENDAGE (N/A) TRANSESOPHAGEAL ECHOCARDIOGRAM (N/A) Subjective: Up in the bedside chair.  Said he had a good day yesterday with no new concerns.  He feels he is ready to return home today.  Objective: Vital signs in last 24 hours: Temp:  [98 F (36.7 C)-98.5 F (36.9 C)] 98 F (36.7 C) (06/22 0333) Pulse Rate:  [54-81] 66 (06/22 0333) Cardiac Rhythm: Atrial fibrillation (06/22 0700) Resp:  [17-20] 17 (06/22 0333) BP: (105-131)/(63-82) 128/67 (06/22 0333) SpO2:  [94 %-97 %] 95 % (06/22 0333) Weight:  [111.2 kg] 111.2 kg (06/22 0333)      Intake/Output from previous day: 06/21 0701 - 06/22 0700 In: -  Out: 1550 [Urine:1550] Intake/Output this shift: Total I/O In: 240 [P.O.:240] Out: 550 [Urine:550]  General appearance: alert, cooperative, and no distress Neurologic: intact Heart: irregularly irregular, monitor showing atrial fibrillation with controlled ventricular rate Lungs: clear to auscultation bilaterally Abdomen: soft, non-tender Extremities: mild pretibial edema  Wound: Clean and dry without sign of infection  Lab Results: Recent Labs    03/02/23 0144 03/03/23 0147  WBC 15.6* 12.6*  HGB 11.4* 11.0*  HCT 33.5* 32.9*  PLT 218 224    BMET:  Recent Labs    03/01/23 0138 03/03/23 0147  NA 131* 134*  K 4.8 3.8  CL 102 100  CO2 22 23  GLUCOSE 150* 149*  BUN 17 22  CREATININE 1.10 1.13  CALCIUM 7.9* 8.1*     PT/INR: No results for input(s): "LABPROT", "INR" in the last 72 hours. ABG    Component Value Date/Time   PHART 7.327 (L) 02/26/2023 1919   HCO3 22.4 02/26/2023 1919   TCO2 24 02/26/2023 1919   ACIDBASEDEF 3.0 (H) 02/26/2023 1919   O2SAT 97  02/26/2023 1919   CBG (last 3)  Recent Labs    03/02/23 1617 03/02/23 2109 03/03/23 0610  GLUCAP 150* 134* 121*     Assessment/Plan: S/P Procedure(s) (LRB): CORONARY ARTERY BYPASS GRAFTING (CABG) X 4 WITH LEFT INTERNAL MAMMARY ARTERY HARVEST AND RIGHT GREATER SAPHENOUS VEIN HARVESTED ENDOSCOPICALLY (N/A) CLIPPING OF LEFT ATRIAL APPENDAGE (N/A) TRANSESOPHAGEAL ECHOCARDIOGRAM (N/A)  CV: Pt with PPM. Hx of paroxysmal atrial fibrillation and currently in A-fib with CVR.  Continue oral amiodarone and carvedilol Coreg 12.5mg  BID.  On Eliquis for hx of stroke and atrial fibrillation.    Pulm: Saturating 95% on RA. Continue IS, flutter valve and ambulation.   GI: Tolerating regular diet, having bowel movements.    Endo: Type 2 diabetes mellitus with reasonable control    Renal: Normal function.  Weight +4lbs from preop. Will continue Spironolactone as he was taking prior to admission and also continue Lasix for a few days postdischarge.  Hyponatremia: Resolving  Expected postop ABLA: Stable H/H   Deconditioning: PT/OT recommended home health and this has been arranged.     Dispo: Discharged home with home health services today.  Instructions given and follow-up as been arranged.   LOS: 5 days    Leary Roca, PA-C 03/03/2023

## 2023-03-03 NOTE — Progress Notes (Signed)
Pt received in chair. Pt asked to ambulate in the hallway this am, but he declines. He voices he did not sleep last night, he is tired and is going home today. Stressed use of I/S, pt pulled 1750 mL. Encouraged use 10x/hr. Reviewed move-in-the-tube sternal precautions. Pt had no questions regarding his [facility] education. Encouraged OOB to chair today and ambulation later if feeling less tired.   Lorin Picket MS, ACSM-CEP, CCRP  8:50 - 9:05

## 2023-03-06 ENCOUNTER — Encounter: Payer: Self-pay | Admitting: *Deleted

## 2023-03-06 ENCOUNTER — Telehealth: Payer: Self-pay | Admitting: *Deleted

## 2023-03-06 DIAGNOSIS — Z951 Presence of aortocoronary bypass graft: Secondary | ICD-10-CM | POA: Diagnosis not present

## 2023-03-06 DIAGNOSIS — E119 Type 2 diabetes mellitus without complications: Secondary | ICD-10-CM | POA: Diagnosis not present

## 2023-03-06 DIAGNOSIS — I480 Paroxysmal atrial fibrillation: Secondary | ICD-10-CM | POA: Diagnosis not present

## 2023-03-06 DIAGNOSIS — Z8546 Personal history of malignant neoplasm of prostate: Secondary | ICD-10-CM | POA: Diagnosis not present

## 2023-03-06 DIAGNOSIS — Z48812 Encounter for surgical aftercare following surgery on the circulatory system: Secondary | ICD-10-CM | POA: Diagnosis not present

## 2023-03-06 DIAGNOSIS — Z8673 Personal history of transient ischemic attack (TIA), and cerebral infarction without residual deficits: Secondary | ICD-10-CM | POA: Diagnosis not present

## 2023-03-06 DIAGNOSIS — I110 Hypertensive heart disease with heart failure: Secondary | ICD-10-CM | POA: Diagnosis not present

## 2023-03-06 DIAGNOSIS — Z950 Presence of cardiac pacemaker: Secondary | ICD-10-CM | POA: Diagnosis not present

## 2023-03-06 DIAGNOSIS — Z7984 Long term (current) use of oral hypoglycemic drugs: Secondary | ICD-10-CM | POA: Diagnosis not present

## 2023-03-06 DIAGNOSIS — Z7901 Long term (current) use of anticoagulants: Secondary | ICD-10-CM | POA: Diagnosis not present

## 2023-03-06 DIAGNOSIS — Z79891 Long term (current) use of opiate analgesic: Secondary | ICD-10-CM | POA: Diagnosis not present

## 2023-03-06 DIAGNOSIS — I429 Cardiomyopathy, unspecified: Secondary | ICD-10-CM | POA: Diagnosis not present

## 2023-03-06 DIAGNOSIS — Z95810 Presence of automatic (implantable) cardiac defibrillator: Secondary | ICD-10-CM | POA: Diagnosis not present

## 2023-03-06 DIAGNOSIS — I5022 Chronic systolic (congestive) heart failure: Secondary | ICD-10-CM | POA: Diagnosis not present

## 2023-03-06 DIAGNOSIS — Z7982 Long term (current) use of aspirin: Secondary | ICD-10-CM | POA: Diagnosis not present

## 2023-03-06 NOTE — Progress Notes (Signed)
Cardiology Office Note:    Date:  03/19/2023   ID:  Bobby Bradley, DOB Jul 01, 1946, MRN [id number]  PCP:  Pearline Cables, MD  Cardiologist:  [name], MD  Electrophysiologist:  Regan Lemming, MD   Referring MD: Pearline Cables, MD   Chief Complaint: hospital follow-up of CABG  History of Present Illness:    Bobby Bradley is a 77 y.o. male with a history of CAD s/p CABG x4 on 02/26/2023, non-ischemic cardiomyopathy/ chronic combined CHF with EF of 35-40% on Echo in 02/2023, s/p ICD for primary prevention of suddent cardiac death, paroxysmal atrial fibrillation / flutter on Eliquis, CVA, hypertension, hyperlipidemia, and type 2 diabetes mellitus who is followed by Dr. Jens Som and Dr. Elberta Fortis and presents today for hospital follow-up of CABG.   Patient has been followed by Dr. Jens Som since 2018 when he moved to [name] from [place]. He was diagnosed with atrial fibrillation in 2012 after having a TIA and as started on Coumadin. He later developed atrial flutter as well. He underwent a Myoviewin in 12/2010 which showed no evidence of ischemia but EF was mildly reduced at 43%. He had several Echos following this that showed persistent LV dysfunction with gradually worsening EF to as low as 35%. He ultimately underwent placement of a single chamber ICD in 11/2014 for primary prevention of sudden cardiac death given persistent LV dysfunction. EF subsequently improved to 55-60% on Echo in 08/2022. Coronary calcium score in 11/2022 was elevated at 835 (75th percentile for age and sex). Cardiac PET was performed in 01/2023 for further evaluatoin of progressive dyspnea on exertion and was high risk with findings consistent with ischemia. This led to a R/LHC on 02/12/2023 which showed severe three vessel CAD including moderate disease of the left main. Therefore, he was referred to CT surgery as an outpatient and CABG was recommended. Echo prior to surgery showed a drop in EF to  35-40% with hypokinesis of the inferior, inferolateral, and lateral walls as well as grade 1 diastolic dysfunction. Patient presented to [name] on 02/26/2023 and underwent CABG x4 with LIMA to LAD, SVG to OM, SVG to Diag, and SVG to PDA as well as left atrial appendage clipping with Dr. Cliffton Asters. He did go into atrial fibrillation following procedure and was started on an Amiodarone drip but remained in atrial fibrillation. He was transitioned to PO Amiodarone and Eliquis at discharge. He was able to be discharged on 6/22/024.  Patient present today for follow-up. Here with wife. He is overall recovering well. He reports some chest wall soreness when he twist his upper body but no angina. He continues to have some shortness of breath with activity but states it is much improved from before. Suspect there is now a deconditioning component. No orthopnea, PND, or edema. No palpitations, lightheadedness, dizziness, or syncope. No abnormal bleeding in urine or stools.   ICD was interrogated on 03/16/2023 and looks like he has been in sustained atrial flutter after CABG. EKG today showed ventricular paced rhythm with underlying atrial flutter. He is asymptomatic with this.  EKGs/Labs/Other Studies Reviewed:    The following studies were reviewed:  Cardiac PET 01/31/2023:   Severe, large, reversible perfusion defect in the basal to apical inferior/inferolateral/anterolateral segments consistent with ischemia. EF drops with stress (32%->29%). TID is present. MBF is globally reduced (1.25). Overall, this is a high-risk stress test which could be related to multi-vessel obstructive CAD.   LV perfusion is abnormal. There is evidence of ischemia. There  is no evidence of infarction. Defect 1: There is a large defect with severe reduction in uptake present in the apical to basal anterolateral, inferior and inferolateral location(s) that is reversible. There is abnormal wall motion in the defect area. Consistent with  ischemia.   Rest left ventricular function is abnormal. Rest global function is moderately reduced. Rest EF: 32 %. Stress left ventricular function is abnormal. Stress global function is severely reduced. Stress EF: 29 %. End diastolic cavity size is mildly enlarged.   Myocardial blood flow was computed to be 0.39ml/g/min at rest and 0.54ml/g/min at stress. Global myocardial blood flow reserve was 1.25 and was abnormal.   Coronary calcium was present on the attenuation correction CT images. Moderate coronary calcifications were present. Coronary calcifications were present in the left anterior descending artery and left circumflex artery distribution(s).   Findings are consistent with ischemia. The study is high risk. _______________  Right/ Left Cardiac Catheterization 02/12/2023:   Prox RCA lesion is 90% stenosed.   Ost Cx to Prox Cx lesion is 99% stenosed.   Mid Cx to Dist Cx lesion is 99% stenosed.   Prox LAD lesion is 99% stenosed.   1st Diag lesion is 80% stenosed.   Ost LM to Mid LM lesion is 50% stenosed.   Severe three vessel CAD Moderate left main stenosis Severe mid LAD stenosis at the takeoff of a large first Diagonal branch. Severe ostial stenosis in the large first Diagonal branch.  Severe proximal and mid Circumflex stenosis. Dominant Circumflex. The Circumflex is seen to fill from right to left and left to left collaterals.  Small non-dominant RCA with severe mid vessel stenosis Normal right and left heart pressures   Recommendations: He has severe multi-vessel CAD. He will need a CT surgery consultation for CABG. Will arrange this as an outpatient. Will plan an echo prior to discharge today or within the next week in our office. I will have him start ASA 81 mg daily. Continue statin.   Diagnostic Dominance: Co-dominant   _______________  Echocardiogram 02/12/2023: Impressions: 1. Left ventricular ejection fraction, by estimation, is 35 to 40%. The  left ventricle has  moderately decreased function. The left ventricle  demonstrates regional wall motion abnormalities (see scoring  diagram/findings for description). There is mild  asymmetric left ventricular hypertrophy of the basal-septal segment. Left  ventricular diastolic parameters are consistent with Grade I diastolic  dysfunction (impaired relaxation).   2. Right ventricular systolic function is normal. The right ventricular  size is normal.   3. The mitral valve is normal in structure. Trivial mitral valve  regurgitation. No evidence of mitral stenosis.   4. The aortic valve is tricuspid. There is mild calcification of the  aortic valve. Aortic valve regurgitation is not visualized. Aortic valve  sclerosis/calcification is present, without any evidence of aortic  stenosis.   5. Limited study due to poor sound wave transmission. There seems to be  hypokinesis of the inferior, inferolateral and lateral walls.   EKG:  EKG ordered today.  EKG Interpretation Date/Time:  Monday March 19 2023 09:34:41 EDT Ventricular Rate:  52 PR Interval:    QRS Duration:  164 QT Interval:  526 QTC Calculation: 489 R Axis:   -84  Text Interpretation: Ventricular paced rhythm with underlying atrial flutter Non-specific T wave changes (due to flutter waves) Confirmed by Marjie Skiff [phone]) on 03/19/2023 10:22:49 AM    Recent Labs: 08/31/2022: TSH 2.110 02/22/2023: ALT 25 02/27/2023: Magnesium 2.3 03/03/2023: BUN 22; Creatinine, Ser 1.13;  Hemoglobin 11.0; Platelets 224; Potassium 3.8; Sodium 134  Recent Lipid Panel    Component Value Date/Time   CHOL 124 11/13/2022 1345   TRIG 321.0 (H) 11/13/2022 1345   HDL 32.30 (L) 11/13/2022 1345   CHOLHDL 4 11/13/2022 1345   VLDL 64.2 (H) 11/13/2022 1345   LDLCALC 33 01/27/2021 1554   LDLDIRECT 55.0 11/13/2022 1345    Physical Exam:    Vital Signs: BP (!) 126/58 (BP Location: Left Arm, Patient Position: Sitting, Cuff Size: Normal)   Pulse (!) 52   Ht 6' (1.829 m)    Wt 233 lb 6.4 oz (105.9 kg)   SpO2 99%   BMI 31.65 kg/m     Wt Readings from Last 3 Encounters:  03/19/23 233 lb 6.4 oz (105.9 kg)  03/03/23 245 lb 2.4 oz (111.2 kg)  02/23/23 242 lb (109.8 kg)     General: 77 y.o. Caucasian male in no acute distress. HEENT: Normocephalic and atraumatic. Sclera clear. EOMs intact. Neck: Supple. No carotid bruits. No JVD. Heart: RRR. Distinct S1 and S2. No murmurs, gallops, or rubs.  Lungs: No increased work of breathing. Clear to ausculation bilaterally. No wheezes, rhonchi, or rales.  Abdomen: Soft, non-distended, and non-tender to palpation. quadrants.  Extremities: No lower extremity edema.    Skin: Warm and dry. Sternotomy and chest tube incisions healing well. Neuro: Alert and oriented x3. No focal deficits. Psych: Normal affect. Responds appropriately.   Assessment:    1. Coronary artery disease involving native coronary artery of native heart without angina pectoris   2. S/P CABG (coronary artery bypass graft)   3. Chronic combined systolic and diastolic CHF (congestive heart failure) (HCC)   4. Atrial flutter, unspecified type (HCC)   5. Paroxysmal atrial fibrillation (HCC)   6. S/P ICD (internal cardiac defibrillator) procedure   7. Primary hypertension   8. Hyperlipidemia, unspecified hyperlipidemia type   9. Type 2 diabetes mellitus with complication, without long-term current use of insulin (HCC)     Plan:    CAD s/p CABG x4 Patient recently underwent CABG x4 on 02/26/2023.  - Recovering well. Some chest soreness but no angina.  - Continue Aspirin 81mg  daily and Crestor 40mg  daily. - Continue to slowly increase physical activity as tolerated.  - Scheduled to see CT Surgery later this month. Recommend Cardiac Rehab when cleared to do so by CT Surgery.   Chronic Combined CHF Patient has a long history of non-ischemic cardiomyopathy with EF as low as 35% in the past. EF later normalized to 60-65% on Echo in 08/2022. However,  now seems to have a component of ischemic cardiomyopathy as well. Echo in 02/2023 showed a drop in EF to 35-40% with hypokinesis of the inferior, inferolateral, and lateral walls as well as grade 1 diastolic dysfunction. LHC at that time showed severe multivessel. CAD. - Euvolemic on exam.  - Continue Coreg 12.5mg  daily.  - Continue Spironolactone 25mg  daily.  - Previously on Losartan 100mg  daily prior to CABG. This was stopped during admission. Will restart 25mg  dialy daily. - Will plan to repeat Echo in about 2-3 months to reassess LV function after revascularization and restoration of sinus rhythm.   Paroxysmal Atrial Fibrillation Persistent Atrial Flutter Initially diagnosed in 2012. Last device check in 02/2023 showed rare paroxymal atrial fibrillation episodes. He went back into atrial fibrillation following CABG and was started on Amiodarone. ICD interrogation on 03/14/2023 showed sustained atrial flutter after CABG. - EKG today shows ventricular paced rhythm with underlying atrial flutter.  Asymptomatic. - Continue Amiodarone 200g daily. - Continue Coreg 12.5mg  twice daily.  - Continue chronic anticoagulation with Eliquis 5mg  twice daily.  - Dr. Elberta Fortis felt like he would probably benefit from being back in normal sinus rhythm. Will arrange outpatient DCCV. Eliquis was restarted after CABG on evening of 02/27/2023. DCCV scheduled for 03/29/2023 with Dr. Jens Som. Will check CBC and BMET today.   Informed Consent   The risks (stroke, cardiac arrhythmias rarely resulting in the need for a temporary or permanent pacemaker, skin irritation or burns and complications associated with conscious sedation including aspiration, arrhythmia, respiratory failure and death), benefits (restoration of normal sinus rhythm) and alternatives of a direct current cardioversion were explained in detail to Mr. Lineback and he agrees to proceed.    S/p ICD S/p single chamber ICD in 2016 for primary prevention of  sudden cardiac death given CHF. - Followed by Dr. Elberta Fortis.  Hypertension BP well controlled.  - Continue medications for CHF as above.  Hyperlipidemia Direct LDL 55 in 11/2022.  - Continue Crestor 40mg  daily.  Type 2 Diabetes Mellitus Hemoglobin A1c 6.9 in 11/2022. - On Metformin.  - Management per PCP.  Disposition: Keep follow-up with Dr. Jens Som later this month on 04/05/2023.  Signed, Corrin Parker, PA-C  03/19/2023 10:32 AM    [facility]

## 2023-03-06 NOTE — Transitions of Care (Post Inpatient/ED Visit) (Signed)
03/06/2023  Name: Gwyn Mehring MRN: [id number] DOB: [birth year]  Today's TOC FU Call Status: Today's TOC FU Call Status:: Successful TOC FU Call Competed TOC FU Call Complete Date: 03/06/23  Transition Care Management Follow-up Telephone Call Date of Discharge: 03/03/23 Discharge Facility: [name]) Type of Discharge: Inpatient Admission Primary Inpatient Discharge Diagnosis:: CABG x 4 How have you been since you were released from the hospital?: Better ("I am doing much better- it was really good to get home and get a shower.  The PT is coming out today.  I am little weak feeling after the surgery, but feeling stronger every day") Any questions or concerns?: No  Items Reviewed: Did you receive and understand the discharge instructions provided?: Yes (thoroughly reviewed with patient who verbalizes good understanding of same) Medications obtained,verified, and reconciled?: Yes (Medications Reviewed) (Full medication reconciliation/ review completed; no concerns or discrepancies identified; confirmed patient obtained/ is taking all newly Rx'd medications as instructed; self-manages medications and denies questions/ concerns around medications today) Any new allergies since your discharge?: No Dietary orders reviewed?: Yes Type of Diet Ordered:: Heart Healthy/ low salt Do you have support at home?: Yes People in Home: spouse Name of Support/Comfort Primary Source: Reports independent in self-care activities; spouse assists as/ if needed/ indicated  Medications Reviewed Today: Medications Reviewed Today     Reviewed by Michaela Corner, RN (Registered Nurse) on 03/06/23 at 1010  Med List Status: <None>   Medication Order Taking? Sig Documenting Provider Last Dose Status Informant  amiodarone (PACERONE) 200 MG tablet [id number] Yes Take 2 tablets twice per day for 7 days, then take 1 tablet twice per day for 7 days, then take 1 tablet once per day thereafter Roddenberry, Myron G,  PA-C Taking Active   apixaban (ELIQUIS) 5 MG TABS tablet [id number] Yes TAKE 1 TABLET(5 MG) BY MOUTH TWICE DAILY Lewayne Bunting, MD Taking Active Self  aspirin EC 81 MG tablet [id number] Yes Take 81 mg by mouth daily. Swallow whole. [provider] Taking Active   carvedilol (COREG) 12.5 MG tablet [id number] Yes Take 1 tablet (12.5 mg total) by mouth 2 (two) times daily with a meal. Roddenberry, Myron G, PA-C Taking Active   cetirizine (ZYRTEC) 10 MG tablet [id number] Yes Take 10 mg by mouth daily. [provider] Taking Active Self  ferrous sulfate 325 (65 FE) MG tablet [id number] Yes Take 1 tablet (325 mg total) by mouth daily with breakfast. Armbruster, Willaim Rayas, MD Taking Active Self  metFORMIN (GLUCOPHAGE) 500 MG tablet [id number] Yes TAKE 1 TABLET(500 MG) BY MOUTH TWICE DAILY WITH A MEAL Copland, Gwenlyn Found, MD Taking Active Self  Multiple Vitamin (MULTI VITAMIN DAILY PO) [id number] Yes Take 1 tablet by mouth daily. [provider] Taking Active Self  oxyCODONE (OXY IR/ROXICODONE) 5 MG immediate release tablet [id number] Yes Take 1 tablet (5 mg total) by mouth every 6 (six) hours as needed for severe pain. Leary Roca, PA-C Taking Active   rosuvastatin (CRESTOR) 40 MG tablet [id number] Yes Take 1 tablet (40 mg total) by mouth daily. Copland, Gwenlyn Found, MD Taking Active Self  spironolactone (ALDACTONE) 25 MG tablet [id number] Yes Take 1 tablet (25 mg total) by mouth daily. Lewayne Bunting, MD Taking Active Self            Home Care and Equipment/Supplies: Were Home Health Services Ordered?: Yes Name of Home Health Agency:: [phone]- PT/ OT Has Agency set up a time to come to your home?: Yes First Home  Health Visit Date: 03/06/23 Any new equipment or medical supplies ordered?: No  Functional Questionnaire: Do you need assistance with bathing/showering or dressing?: No Do you need assistance with meal preparation?: No Do you need assistance with eating?:  No Do you have difficulty maintaining continence: No Do you need assistance with getting out of bed/getting out of a chair/moving?: No Do you have difficulty managing or taking your medications?: No (wife assisting as needed post-recent surgery, normally manages medications independently)  Follow up appointments reviewed: PCP Follow-up appointment confirmed?: NA Specialist Hospital Follow-up appointment confirmed?: Yes Date of Specialist follow-up appointment?: 03/09/23 Follow-Up Specialty Provider:: [facility] 03/09/23- virtual visit with Dr. Cliffton Asters; 03/12/23- ECHOcardiogram; 03/19/23- cardiology provider Do you need transportation to your follow-up appointment?: No Do you understand care options if your condition(s) worsen?: Yes-patient verbalized understanding  SDOH Interventions Today    Flowsheet Row Most Recent Value  SDOH Interventions   Food Insecurity Interventions Intervention Not Indicated  Transportation Interventions Intervention Not Indicated  [normaly drives self,  wife assisting with transportation post- recent CABG surgery]      ,thntoc Interventions Today    Flowsheet Row Most Recent Value  Chronic Disease   Chronic disease during today's visit Other, Atrial Fibrillation (AFib)  [planned CABG x 4]  General Interventions   General Interventions Discussed/Reviewed General Interventions Discussed, Doctor Visits, Durable Medical Equipment (DME)  Doctor Visits Discussed/Reviewed Doctor Visits Discussed, Specialist  Durable Medical Equipment (DME) Val Riles normally does not use assistive devices,  using walker post-recent surgery]  PCP/Specialist Visits Compliance with follow-up visit  Exercise Interventions   Exercise Discussed/Reviewed Exercise Discussed  [facility] PT/ cardiac rehab referral]  Education Interventions   Education Provided Provided Education  Provided Verbal Education On Medication  [new to Amiodarone- dosing instructions reinforced]  Nutrition  Interventions   Nutrition Discussed/Reviewed Nutrition Discussed  Pharmacy Interventions   Pharmacy Dicussed/Reviewed Pharmacy Topics Discussed  [Full medication review with updating medication list in EHR per patient report]  Safety Interventions   Safety Discussed/Reviewed Safety Discussed      Caryl Pina, RN, BSN, CCRN Alumnus RN CM Care Coordination/ Transition of Care- [facility] [phone]: direct office

## 2023-03-08 ENCOUNTER — Encounter: Payer: Self-pay | Admitting: Cardiology

## 2023-03-08 ENCOUNTER — Telehealth (HOSPITAL_COMMUNITY): Payer: Self-pay

## 2023-03-08 DIAGNOSIS — E119 Type 2 diabetes mellitus without complications: Secondary | ICD-10-CM | POA: Diagnosis not present

## 2023-03-08 DIAGNOSIS — Z48812 Encounter for surgical aftercare following surgery on the circulatory system: Secondary | ICD-10-CM | POA: Diagnosis not present

## 2023-03-08 DIAGNOSIS — I480 Paroxysmal atrial fibrillation: Secondary | ICD-10-CM | POA: Diagnosis not present

## 2023-03-08 DIAGNOSIS — I110 Hypertensive heart disease with heart failure: Secondary | ICD-10-CM | POA: Diagnosis not present

## 2023-03-08 DIAGNOSIS — I5022 Chronic systolic (congestive) heart failure: Secondary | ICD-10-CM | POA: Diagnosis not present

## 2023-03-08 DIAGNOSIS — Z7984 Long term (current) use of oral hypoglycemic drugs: Secondary | ICD-10-CM | POA: Diagnosis not present

## 2023-03-08 NOTE — Telephone Encounter (Signed)
Per Phase I cardiac rehab fax referral to [facility]

## 2023-03-09 ENCOUNTER — Ambulatory Visit (INDEPENDENT_AMBULATORY_CARE_PROVIDER_SITE_OTHER): Payer: Self-pay | Admitting: Thoracic Surgery (Cardiothoracic Vascular Surgery)

## 2023-03-09 DIAGNOSIS — Z951 Presence of aortocoronary bypass graft: Secondary | ICD-10-CM

## 2023-03-09 NOTE — Progress Notes (Signed)
     [address]       [name] [address]             [phone]       Patient: Home Provider: Office Consent for Telemedicine visit obtained.  Today's visit was completed via a real-time telehealth (see specific modality noted below). The patient/authorized person provided oral consent at the time of the visit to engage in a telemedicine encounter with the present provider at [facility]. The patient/authorized person was informed of the potential benefits, limitations, and risks of telemedicine. The patient/authorized person expressed understanding that the laws that protect confidentiality also apply to telemedicine. The patient/authorized person acknowledged understanding that telemedicine does not provide emergency services and that he or she would need to call 911 or proceed to the nearest hospital for help if such a need arose.   Total time spent in the clinical discussion 10 minutes.  Telehealth Modality: Phone visit (audio only)  I had a telephone visit with  Bobby Bradley who is s/p CABG.  Overall doing well.  Pain is minimal.  Ambulating well. Vitals have been stable.  B[place] will see [place] back in 1 month with a chest x-ray for cardiac rehab clearance.  [name]

## 2023-03-12 ENCOUNTER — Ambulatory Visit (INDEPENDENT_AMBULATORY_CARE_PROVIDER_SITE_OTHER): Payer: Medicare Other

## 2023-03-12 ENCOUNTER — Telehealth: Payer: Self-pay

## 2023-03-12 ENCOUNTER — Ambulatory Visit (HOSPITAL_COMMUNITY): Payer: Medicare Other

## 2023-03-12 DIAGNOSIS — I428 Other cardiomyopathies: Secondary | ICD-10-CM

## 2023-03-12 LAB — CUP PACEART REMOTE DEVICE CHECK
Battery Remaining Longevity: 96 mo
Battery Remaining Percentage: 84 %
Brady Statistic RV Percent Paced: 27 %
Date Time Interrogation Session: 20240701044000
HighPow Impedance: 38 Ohm
Implantable Lead Connection Status: 753985
Implantable Lead Implant Date: 20160308
Implantable Lead Location: 753860
Implantable Lead Model: 295
Implantable Lead Serial Number: 363165
Implantable Pulse Generator Implant Date: 20160308
Lead Channel Impedance Value: 367 Ohm
Lead Channel Pacing Threshold Amplitude: 0.6 V
Lead Channel Pacing Threshold Pulse Width: 0.5 ms
Lead Channel Setting Pacing Amplitude: 2.4 V
Lead Channel Setting Pacing Pulse Width: 0.5 ms
Lead Channel Setting Sensing Sensitivity: 0.6 mV
Pulse Gen Serial Number: 200269

## 2023-03-12 NOTE — Telephone Encounter (Signed)
Called and spoke to patient. He will go to the lab one day this week, probably Friday

## 2023-03-12 NOTE — Telephone Encounter (Signed)
-----   Message from Illene Bolus, [place] sent at 01/29/2023  9:51 AM EDT ----- Regarding: FW: due for labs According to the office visit from April, patient is due for labs in July. Forwarding this message to July. ----- Message ----- From: Cooper Render, CMA Sent: 01/29/2023  12:00 AM EDT To: Cooper Render, CMA Subject: due for labs                                   Due for CBC and IBC/Ferritin in late May (did he come for appointment in April)?

## 2023-03-13 ENCOUNTER — Telehealth: Payer: Self-pay

## 2023-03-13 DIAGNOSIS — I5022 Chronic systolic (congestive) heart failure: Secondary | ICD-10-CM | POA: Diagnosis not present

## 2023-03-13 DIAGNOSIS — I110 Hypertensive heart disease with heart failure: Secondary | ICD-10-CM | POA: Diagnosis not present

## 2023-03-13 DIAGNOSIS — Z7984 Long term (current) use of oral hypoglycemic drugs: Secondary | ICD-10-CM | POA: Diagnosis not present

## 2023-03-13 DIAGNOSIS — I480 Paroxysmal atrial fibrillation: Secondary | ICD-10-CM | POA: Diagnosis not present

## 2023-03-13 DIAGNOSIS — Z48812 Encounter for surgical aftercare following surgery on the circulatory system: Secondary | ICD-10-CM | POA: Diagnosis not present

## 2023-03-13 DIAGNOSIS — E119 Type 2 diabetes mellitus without complications: Secondary | ICD-10-CM | POA: Diagnosis not present

## 2023-03-13 NOTE — Telephone Encounter (Signed)
Following alert received from [employer] received for  significant decrease in shock impedance noted, currently 38 ohms, previously 57 ohms on 02/23/23. Routed to clinic for review.  Trends illustrate a significant increase in mean heart rate. Histograms are stable. Increased VP noted. Currently 27%, previously 5% on 02/26/23.   Patient scheudled in device clinic 03/14/23 2 8:40 AM. Location, date and time reviewed with patient w/ verbal understanding.   Patient had recent CABG X4 02/26/23. Email sent to [employer] requesting assistance in device.

## 2023-03-14 ENCOUNTER — Ambulatory Visit: Payer: Medicare Other | Attending: Internal Medicine

## 2023-03-14 DIAGNOSIS — Z7984 Long term (current) use of oral hypoglycemic drugs: Secondary | ICD-10-CM | POA: Diagnosis not present

## 2023-03-14 DIAGNOSIS — I480 Paroxysmal atrial fibrillation: Secondary | ICD-10-CM | POA: Diagnosis not present

## 2023-03-14 DIAGNOSIS — I428 Other cardiomyopathies: Secondary | ICD-10-CM

## 2023-03-14 DIAGNOSIS — E119 Type 2 diabetes mellitus without complications: Secondary | ICD-10-CM | POA: Diagnosis not present

## 2023-03-14 DIAGNOSIS — I110 Hypertensive heart disease with heart failure: Secondary | ICD-10-CM | POA: Diagnosis not present

## 2023-03-14 DIAGNOSIS — Z48812 Encounter for surgical aftercare following surgery on the circulatory system: Secondary | ICD-10-CM | POA: Diagnosis not present

## 2023-03-14 DIAGNOSIS — I5022 Chronic systolic (congestive) heart failure: Secondary | ICD-10-CM | POA: Diagnosis not present

## 2023-03-14 LAB — CUP PACEART INCLINIC DEVICE CHECK
Date Time Interrogation Session: 20240703095748
HighPow Impedance: 43 Ohm
Implantable Lead Connection Status: 753985
Implantable Lead Implant Date: 20160308
Implantable Lead Location: 753860
Implantable Lead Model: 295
Implantable Lead Serial Number: 363165
Implantable Pulse Generator Implant Date: 20160308
Lead Channel Impedance Value: 385 Ohm
Lead Channel Sensing Intrinsic Amplitude: 19.1 mV
Lead Channel Setting Pacing Amplitude: 2.4 V
Lead Channel Setting Pacing Pulse Width: 0.5 ms
Lead Channel Setting Sensing Sensitivity: 0.6 mV
Pulse Gen Serial Number: 200269

## 2023-03-14 NOTE — Progress Notes (Signed)
ICD check in clinic. Normal device function. Thresholds and sensing consistent with previous device measurements. Recheck impedence from post cabg WNL. Sustained aflutter. Known history + Eliquis. No ventricular arrhythmias. Histogram distribution appropriate for patient and level of activity. No changes made this session. Device programmed at appropriate safety margins. Device programmed to optimize intrinsic conduction. Pt enrolled in remote follow-up. Patient education completed.

## 2023-03-16 MED FILL — Calcium Chloride Inj 10%: INTRAVENOUS | Qty: 10 | Status: AC

## 2023-03-16 MED FILL — Heparin Sodium (Porcine) Inj 1000 Unit/ML: INTRAMUSCULAR | Qty: 10 | Status: AC

## 2023-03-16 MED FILL — Heparin Sodium (Porcine) Inj 1000 Unit/ML: INTRAMUSCULAR | Qty: 30 | Status: AC

## 2023-03-16 MED FILL — Sodium Chloride IV Soln 0.9%: INTRAVENOUS | Qty: 3000 | Status: AC

## 2023-03-16 MED FILL — Sodium Bicarbonate IV Soln 8.4%: INTRAVENOUS | Qty: 50 | Status: AC

## 2023-03-16 MED FILL — Lidocaine HCl Local Preservative Free (PF) Inj 2%: INTRAMUSCULAR | Qty: 14 | Status: AC

## 2023-03-16 MED FILL — Mannitol IV Soln 20%: INTRAVENOUS | Qty: 500 | Status: AC

## 2023-03-19 ENCOUNTER — Ambulatory Visit: Payer: Medicare Other | Admitting: Student

## 2023-03-19 ENCOUNTER — Encounter: Payer: Self-pay | Admitting: Student

## 2023-03-19 VITALS — BP 126/58 | HR 52 | Ht 72.0 in | Wt 233.4 lb

## 2023-03-19 DIAGNOSIS — Z951 Presence of aortocoronary bypass graft: Secondary | ICD-10-CM | POA: Diagnosis not present

## 2023-03-19 DIAGNOSIS — I480 Paroxysmal atrial fibrillation: Secondary | ICD-10-CM | POA: Diagnosis not present

## 2023-03-19 DIAGNOSIS — Z95810 Presence of automatic (implantable) cardiac defibrillator: Secondary | ICD-10-CM | POA: Diagnosis not present

## 2023-03-19 DIAGNOSIS — I10 Essential (primary) hypertension: Secondary | ICD-10-CM | POA: Diagnosis not present

## 2023-03-19 DIAGNOSIS — I5042 Chronic combined systolic (congestive) and diastolic (congestive) heart failure: Secondary | ICD-10-CM | POA: Insufficient documentation

## 2023-03-19 DIAGNOSIS — I4892 Unspecified atrial flutter: Secondary | ICD-10-CM | POA: Diagnosis not present

## 2023-03-19 DIAGNOSIS — E785 Hyperlipidemia, unspecified: Secondary | ICD-10-CM | POA: Insufficient documentation

## 2023-03-19 DIAGNOSIS — I2510 Atherosclerotic heart disease of native coronary artery without angina pectoris: Secondary | ICD-10-CM | POA: Insufficient documentation

## 2023-03-19 DIAGNOSIS — Z7984 Long term (current) use of oral hypoglycemic drugs: Secondary | ICD-10-CM | POA: Diagnosis not present

## 2023-03-19 DIAGNOSIS — E118 Type 2 diabetes mellitus with unspecified complications: Secondary | ICD-10-CM | POA: Diagnosis not present

## 2023-03-19 LAB — BASIC METABOLIC PANEL
BUN/Creatinine Ratio: 15 (ref 10–24)
CO2: 23 mmol/L (ref 20–29)
Calcium: 9.6 mg/dL (ref 8.6–10.2)
Chloride: 106 mmol/L (ref 96–106)
Creatinine, Ser: 1.32 mg/dL — ABNORMAL HIGH (ref 0.76–1.27)
Glucose: 109 mg/dL — ABNORMAL HIGH (ref 70–99)
Potassium: 5.5 mmol/L — ABNORMAL HIGH (ref 3.5–5.2)
Sodium: 143 mmol/L (ref 134–144)

## 2023-03-19 LAB — CBC

## 2023-03-19 MED ORDER — AMIODARONE HCL 200 MG PO TABS: ORAL_TABLET | ORAL | 1 refills | Status: DC

## 2023-03-19 MED ORDER — LOSARTAN POTASSIUM 25 MG PO TABS: 25.0000 mg | ORAL_TABLET | Freq: Every day | ORAL | 3 refills | Status: DC

## 2023-03-19 NOTE — Patient Instructions (Signed)
Medication Instructions:  Take Losartan 25mg  daily. This medication has been sent to your pharmacy  *If you need a refill on your cardiac medications before your next appointment, please call your pharmacy*   Lab Work: Labs will be drawn today If you have labs (blood work) drawn today and your tests are completely normal, you will receive your results only by: MyChart Message (if you have MyChart) OR A paper copy in the mail If you have any lab test that is abnormal or we need to change your treatment, we will call you to review the results.   Testing/Procedures: Cardio version   Follow-Up: At [facility], you and your health needs are our priority.  As part of our continuing mission to provide you with exceptional heart care, we have created designated Provider Care Teams.  These Care Teams include your primary Cardiologist (physician) and Advanced Practice Providers (APPs -  Physician Assistants and Nurse Practitioners) who all work together to provide you with the care you need, when you need it.  We recommend signing up for the patient portal called "MyChart".  Sign up information is provided on this After Visit Summary.  MyChart is used to connect with patients for Virtual Visits (Telemedicine).  Patients are able to view lab/test results, encounter notes, upcoming appointments, etc.  Non-urgent messages can be sent to your provider as well.   To learn more about what you can do with MyChart, go to [email or web address].    Your next appointment:  Keep scheduled appointment    Provider:   [name], MD     Other Instructions     Dear Bobby Bradley  You are scheduled for a Cardioversion on Thursday, July 18 with Dr. Jens Som.  Please arrive at the [facility] (Main Entrance A) at [facility]: [address] [place], [place] [address] at 1:30 PM (This time is 1 hour(s) before your procedure to ensure your preparation). Free valet parking service is  available. You will check in at ADMITTING. The support person will be asked to wait in the waiting room.  It is OK to have someone drop you off and come back when you are ready to be discharged.      DIET:  Nothing to eat or drink after midnight except a sip of water with medications (see medication instructions below)  MEDICATION INSTRUCTIONS: !!IF ANY NEW MEDICATIONS ARE STARTED AFTER TODAY, PLEASE NOTIFY YOUR PROVIDER AS SOON AS POSSIBLE!!  FYI: Medications such as Semaglutide (Ozempic, [place]), Tirzepatide (Mounjaro, Zepbound), Dulaglutide (Trulicity), etc ("GLP1 agonists") AND Canagliflozin (Invokana), Dapagliflozin (Farxiga), Empagliflozin (Jardiance), Ertugliflozin (Steglatro), Bexagliflozin [employer]) or any combination with one of these drugs such as Invokamet (Canagliflozin/Metformin), Synjardy (Empagliflozin/Metformin), etc ("SGLT2 inhibitors") must be held around the time of a procedure. This is not a comprehensive list of all of these drugs. Please review all of your medications and talk to your provider if you take any one of these. If you are not sure, ask your provider.    HOLD THE METFORMIN THE DAY OF THE PROCEDURE AND 48 HOURS AFTER THE PROCEDURE    Continue taking your anticoagulant (blood thinner): Apixaban (Eliquis).  You will need to continue this after your procedure until you are told by your provider that it is safe to stop.    LABS:     FYI:  For your safety, and to allow [place] to monitor your vital signs accurately during the surgery/procedure we request: If you have artificial nails, gel coating, SNS etc,  please have those removed prior to your surgery/procedure. Not having the nail coverings /polish removed may result in cancellation or delay of your surgery/procedure.  You must have a responsible person to drive you home and stay in the waiting area during your procedure. Failure to do so could result in cancellation.  Bring your insurance cards.  *Special Note:  Every effort is made to have your procedure done on time. Occasionally there are emergencies that occur at the hospital that may cause delays. Please be patient if a delay does occur.

## 2023-03-20 ENCOUNTER — Other Ambulatory Visit: Payer: Self-pay

## 2023-03-20 ENCOUNTER — Encounter: Payer: Self-pay | Admitting: Gastroenterology

## 2023-03-20 ENCOUNTER — Telehealth: Payer: Self-pay | Admitting: Emergency Medicine

## 2023-03-20 DIAGNOSIS — Z79899 Other long term (current) drug therapy: Secondary | ICD-10-CM

## 2023-03-20 DIAGNOSIS — I5042 Chronic combined systolic (congestive) and diastolic (congestive) heart failure: Secondary | ICD-10-CM

## 2023-03-20 DIAGNOSIS — D509 Iron deficiency anemia, unspecified: Secondary | ICD-10-CM

## 2023-03-20 DIAGNOSIS — E875 Hyperkalemia: Secondary | ICD-10-CM

## 2023-03-20 LAB — BASIC METABOLIC PANEL
BUN: 20 mg/dL (ref 8–27)
eGFR: 56 mL/min/{1.73_m2} — ABNORMAL LOW (ref >59)

## 2023-03-20 LAB — CBC
Hematocrit: 39.9 % (ref 37.5–51.0)
Hemoglobin: 12.5 g/dL — ABNORMAL LOW (ref 13.0–17.7)
MCH: 27.9 pg (ref 26.6–33.0)
MCHC: 31.3 g/dL — ABNORMAL LOW (ref 31.5–35.7)
MCV: 89 fL (ref 79–97)
Platelets: 423 10*3/uL (ref 150–450)
RBC: 4.48 x10E6/uL (ref 4.14–5.80)
RDW: 13.6 % (ref 11.6–15.4)

## 2023-03-20 MED ORDER — SPIRONOLACTONE 25 MG PO TABS: 12.5000 mg | ORAL_TABLET | Freq: Every day | ORAL | 3 refills | Status: DC

## 2023-03-20 NOTE — Telephone Encounter (Signed)
Irene Limbo, Callie E, PA-C  P Cv Div Nl Triage Triage, do you mind calling patient with results (I think Marcelino Duster is off today): Creatinine (which assesses kidney function) and potassium level are both slightly elevated. Otherwise, labs look good. Let's decrease his Spironolactone to 12.5mg  daily. He should also limit foods high in potassium and stop any over the counter supplements or vitamins that contain potassium. Recommend repeat BMET on Friday 03/23/2023.  Thank you!  Called pt and gave the above information; also released it to MyChart. Pt does not think that he needs a prescription sent and will call if he needs one sent in. Order placed for BMP - pt plans to get lab work completed at another [facility]- order released. Pt verbalized understanding of all information.

## 2023-03-23 DIAGNOSIS — E875 Hyperkalemia: Secondary | ICD-10-CM | POA: Diagnosis not present

## 2023-03-23 DIAGNOSIS — I5042 Chronic combined systolic (congestive) and diastolic (congestive) heart failure: Secondary | ICD-10-CM | POA: Diagnosis not present

## 2023-03-23 DIAGNOSIS — Z79899 Other long term (current) drug therapy: Secondary | ICD-10-CM | POA: Diagnosis not present

## 2023-03-24 LAB — BASIC METABOLIC PANEL
BUN/Creatinine Ratio: 14 (ref 10–24)
BUN: 18 mg/dL (ref 8–27)
CO2: 18 mmol/L — ABNORMAL LOW (ref 20–29)
Calcium: 9.1 mg/dL (ref 8.6–10.2)
Chloride: 103 mmol/L (ref 96–106)
Creatinine, Ser: 1.32 mg/dL — ABNORMAL HIGH (ref 0.76–1.27)
Glucose: 154 mg/dL — ABNORMAL HIGH (ref 70–99)
Potassium: 4.8 mmol/L (ref 3.5–5.2)
Sodium: 139 mmol/L (ref 134–144)
eGFR: 56 mL/min/{1.73_m2} — ABNORMAL LOW (ref >59)

## 2023-03-27 ENCOUNTER — Encounter: Payer: Self-pay | Admitting: Cardiology

## 2023-03-28 NOTE — Progress Notes (Signed)
HPI: FU CAD, atrial fibrillation and CHF. Also with prior ICD. Previously moved from [place].  Has history of paroxysmal atrial fibrillation and TIA.  Abdominal ultrasound February 2018 showed no aneurysm. PET scan Jan 31, 2023 showed large reversible defect in the apical inferior, inferolateral, anterolateral segment with ejection fraction dropping from 32% to 29% with exercise and study felt to be high risk.  Cardiac catheterization June 2024 showed severe three-vessel coronary artery disease.  Echocardiogram June 2024 showed ejection fraction 35 to 40%, mild left ventricular hypertrophy, grade 1 diastolic dysfunction.  Carotid Dopplers June 2024 showed no significant stenosis.  Patient had coronary artery bypass and graft on February 26, 2023 with a LIMA to the LAD, saphenous vein graft to the diagonal, saphenous vein graft to the obtuse marginal and saphenous vein graft to the PDA.  Postop course complicated by atrial fibrillation and he was placed on amiodarone.  He was also started on apixaban.  Patient subsequently underwent recent cardioversion to sinus rhythm.  Since last seen he denies dyspnea, chest pain, palpitations or syncope.  Current Outpatient Medications  Medication Sig Dispense Refill   amiodarone (PACERONE) 200 MG tablet Take 1 tablet daily 60 tablet 1   apixaban (ELIQUIS) 5 MG TABS tablet TAKE 1 TABLET(5 MG) BY MOUTH TWICE DAILY 180 tablet 1   aspirin EC 81 MG tablet Take 81 mg by mouth daily. Swallow whole.     carvedilol (COREG) 12.5 MG tablet Take 1 tablet (12.5 mg total) by mouth 2 (two) times daily with a meal. 60 tablet 1   cetirizine (ZYRTEC) 10 MG tablet Take 10 mg by mouth daily.     ferrous sulfate 325 (65 FE) MG tablet Take 1 tablet (325 mg total) by mouth daily with breakfast. 90 tablet 3   losartan (COZAAR) 25 MG tablet Take 1 tablet (25 mg total) by mouth daily. 90 tablet 3   metFORMIN (GLUCOPHAGE) 500 MG tablet TAKE 1 TABLET(500 MG) BY MOUTH TWICE DAILY WITH A  MEAL 180 tablet 3   rosuvastatin (CRESTOR) 40 MG tablet Take 1 tablet (40 mg total) by mouth daily. 90 tablet 3   spironolactone (ALDACTONE) 25 MG tablet Take 0.5 tablets (12.5 mg total) by mouth daily. 45 tablet 3   tetrahydrozoline 0.05 % ophthalmic solution Place 1 drop into both eyes every morning.     No current facility-administered medications for this visit.     Past Medical History:  Diagnosis Date   A-fib [facility])    AICD (automatic cardioverter/defibrillator) present    CHF (congestive heart failure) (HCC)    Colon polyps    Diabetes mellitus without complication (HCC)    Dysrhythmia    Essential hypertension    Hyperlipidemia    Non-ischemic cardiomyopathy (HCC)    Presence of combination internal cardiac defibrillator (ICD) and pacemaker    Presence of permanent cardiac pacemaker    Prostate cancer (HCC)    Stroke (HCC)    TIA- 1994   TIA (transient ischemic attack)     Past Surgical History:  Procedure Laterality Date   BACK SURGERY     CARDIAC CATHETERIZATION     CARDIOVERSION N/A 03/29/2023   Procedure: CARDIOVERSION;  Surgeon: Lewayne Bunting, MD;  Location: [facility] INVASIVE CV LAB;  Service: Cardiovascular;  Laterality: N/A;   CLIPPING OF ATRIAL APPENDAGE N/A 02/26/2023   Procedure: CLIPPING OF LEFT ATRIAL APPENDAGE;  Surgeon: Corliss Skains, MD;  Location: [facility] OR;  Service: Open Heart Surgery;  Laterality: N/A;  COLONOSCOPY     about 10 years. Done in [place] or [place]    CORONARY ARTERY BYPASS GRAFT N/A 02/26/2023   Procedure: CORONARY ARTERY BYPASS GRAFTING (CABG) X 4 WITH LEFT INTERNAL MAMMARY ARTERY HARVEST AND RIGHT GREATER SAPHENOUS VEIN HARVESTED ENDOSCOPICALLY;  Surgeon: Corliss Skains, MD;  Location: [facility] OR;  Service: Open Heart Surgery;  Laterality: N/A;   ICD IMPLANT  2012   PROSTATECTOMY     RIGHT/LEFT HEART CATH AND CORONARY ANGIOGRAPHY N/A 02/12/2023   Procedure: RIGHT/LEFT HEART CATH AND CORONARY ANGIOGRAPHY;  Surgeon:  Kathleene Hazel, MD;  Location: [facility] INVASIVE CV LAB;  Service: Cardiovascular;  Laterality: N/A;   TEE WITHOUT CARDIOVERSION N/A 02/26/2023   Procedure: TRANSESOPHAGEAL ECHOCARDIOGRAM;  Surgeon: Corliss Skains, MD;  Location: [facility] OR;  Service: Open Heart Surgery;  Laterality: N/A;    Social History   Socioeconomic History   Marital status: Married    Spouse name: Not on file   Number of children: 7   Years of education: Not on file   Highest education level: Not on file  Occupational History   Occupation: Print production planner   Tobacco Use   Smoking status: Former    Current packs/day: 0.00    Types: Cigarettes    Quit date: 09/12/1983    Years since quitting: 39.5    Passive exposure: Never   Smokeless tobacco: Never  Vaping Use   Vaping status: Never Used  Substance and Sexual Activity   Alcohol use: Yes    Comment: very rare   Drug use: No   Sexual activity: Not Currently  Other Topics Concern   Not on file  Social History Narrative   Not on file   Social Determinants of Health   Financial Resource Strain: Low Risk  (07/13/2021)   Overall Financial Resource Strain (CARDIA)    Difficulty of Paying Living Expenses: Not hard at all  Food Insecurity: No Food Insecurity (03/06/2023)   Hunger Vital Sign    Worried About Running Out of Food in the Last Year: Never true    Ran Out of Food in the Last Year: Never true  Transportation Needs: No Transportation Needs (03/06/2023)   PRAPARE - Administrator, Civil Service (Medical): No    Lack of Transportation (Non-Medical): No  Physical Activity: Inactive (07/13/2021)   Exercise Vital Sign    Days of Exercise per Week: 0 days    Minutes of Exercise per Session: 0 min  Stress: No Stress Concern Present (07/13/2021)   [employer] of Occupational Health - Occupational Stress Questionnaire    Feeling of Stress : Not at all  Social Connections: Unknown (09/02/2022)   Received from [facility]    Social Network    Social Network: Not on file  Intimate Partner Violence: Not At Risk (02/26/2023)   Humiliation, Afraid, Rape, and Kick questionnaire    Fear of Current or Ex-Partner: No    Emotionally Abused: No    Physically Abused: No    Sexually Abused: No    Family History  Problem Relation Age of Onset   Lung cancer Mother    Uterine cancer Mother    Lung cancer Father    Atrial fibrillation Sister    Lung cancer Brother    Colon cancer Neg Hx    Esophageal cancer Neg Hx    Pancreatic cancer Neg Hx    Liver disease Neg Hx    Stomach cancer Neg Hx  Rectal cancer Neg Hx     ROS: no fevers or chills, productive cough, hemoptysis, dysphasia, odynophagia, melena, hematochezia, dysuria, hematuria, rash, seizure activity, orthopnea, PND, pedal edema, claudication. Remaining systems are negative.  Physical Exam: Well-developed well-nourished in no acute distress.  Skin is warm and dry.  HEENT is normal.  Neck is supple.  Chest is clear to auscultation with normal expansion.  Status post tenotomy without evidence of infection. Cardiovascular exam is regular rate and rhythm.  Abdominal exam nontender or distended. No masses palpated. Extremities show no edema. neuro grossly intact  EKG Interpretation Date/Time:  Thursday April 05 2023 09:44:32 EDT Ventricular Rate:  57 PR Interval:  362 QRS Duration:  112 QT Interval:  440 QTC Calculation: 428 R Axis:   75  Text Interpretation: Sinus bradycardia with 1st degree A-V block T wave abnormality, consider inferior ischemia When compared with ECG of 29-Mar-2023 13:28, T wave inversion no longer evident in Anterior leads Confirmed by [name] ([id number]) on 04/05/2023 9:52:37 AM    A/P  1 coronary artery disease status post recent coronary artery bypass graft-continue aspirin and statin.  2 ischemic cardiomyopathy-discontinue losartan and treat with Entresto 24/26 twice daily.  Continue carvedilol and spironolactone.   Repeat echocardiogram in 3 months to see if LV function has improved following revascularization.  If LV function remains decreased we will add SGLT2 inhibitor.  3 paroxysmal atrial fibrillation/flutter-patient is now status post recent cardioversion.  Will continue amiodarone and apixaban.  4 status post ICD-Per EP.  5 hypertension-patient's blood pressure is controlled.  Continue present medical regimen.  6 hyperlipidemia-continue Crestor.  [name], MD

## 2023-03-28 NOTE — Progress Notes (Signed)
Spoke to pt and instructed them to come at 1200 and to be NPO after 0000. Confirmed no missed doses of AC and instructed to take in AM with a small sip of water.   Confirmed that pt will have a ride home and someone to stay with them for 24 hours after the procedure. 

## 2023-03-29 ENCOUNTER — Other Ambulatory Visit: Payer: Self-pay

## 2023-03-29 ENCOUNTER — Ambulatory Visit (HOSPITAL_COMMUNITY): Payer: Medicare Other | Admitting: Certified Registered"

## 2023-03-29 ENCOUNTER — Ambulatory Visit (HOSPITAL_BASED_OUTPATIENT_CLINIC_OR_DEPARTMENT_OTHER): Payer: Medicare Other | Admitting: Certified Registered"

## 2023-03-29 ENCOUNTER — Ambulatory Visit (HOSPITAL_COMMUNITY)
Admission: RE | Admit: 2023-03-29 | Discharge: 2023-03-29 | Disposition: A | Discharge status: Home / Self Care | Payer: Medicare Other | Source: Ambulatory Visit | Attending: Cardiology | Admitting: Cardiology

## 2023-03-29 ENCOUNTER — Encounter (HOSPITAL_COMMUNITY): Payer: Self-pay | Admitting: Cardiology

## 2023-03-29 ENCOUNTER — Encounter (HOSPITAL_COMMUNITY)
Admission: RE | Disposition: A | Discharge status: Home / Self Care | Payer: Self-pay | Source: Ambulatory Visit | Attending: Cardiology

## 2023-03-29 DIAGNOSIS — Z95810 Presence of automatic (implantable) cardiac defibrillator: Secondary | ICD-10-CM | POA: Diagnosis not present

## 2023-03-29 DIAGNOSIS — I5022 Chronic systolic (congestive) heart failure: Secondary | ICD-10-CM | POA: Diagnosis not present

## 2023-03-29 DIAGNOSIS — Z7901 Long term (current) use of anticoagulants: Secondary | ICD-10-CM | POA: Insufficient documentation

## 2023-03-29 DIAGNOSIS — I110 Hypertensive heart disease with heart failure: Secondary | ICD-10-CM | POA: Insufficient documentation

## 2023-03-29 DIAGNOSIS — I4892 Unspecified atrial flutter: Secondary | ICD-10-CM | POA: Diagnosis not present

## 2023-03-29 DIAGNOSIS — I2510 Atherosclerotic heart disease of native coronary artery without angina pectoris: Secondary | ICD-10-CM | POA: Diagnosis not present

## 2023-03-29 DIAGNOSIS — I483 Typical atrial flutter: Secondary | ICD-10-CM | POA: Diagnosis not present

## 2023-03-29 DIAGNOSIS — Z7982 Long term (current) use of aspirin: Secondary | ICD-10-CM | POA: Diagnosis not present

## 2023-03-29 DIAGNOSIS — Z951 Presence of aortocoronary bypass graft: Secondary | ICD-10-CM | POA: Diagnosis not present

## 2023-03-29 DIAGNOSIS — I428 Other cardiomyopathies: Secondary | ICD-10-CM | POA: Diagnosis not present

## 2023-03-29 DIAGNOSIS — Z79899 Other long term (current) drug therapy: Secondary | ICD-10-CM | POA: Insufficient documentation

## 2023-03-29 DIAGNOSIS — Z7984 Long term (current) use of oral hypoglycemic drugs: Secondary | ICD-10-CM | POA: Insufficient documentation

## 2023-03-29 DIAGNOSIS — Z8673 Personal history of transient ischemic attack (TIA), and cerebral infarction without residual deficits: Secondary | ICD-10-CM | POA: Insufficient documentation

## 2023-03-29 DIAGNOSIS — I480 Paroxysmal atrial fibrillation: Secondary | ICD-10-CM | POA: Diagnosis not present

## 2023-03-29 DIAGNOSIS — Z87891 Personal history of nicotine dependence: Secondary | ICD-10-CM

## 2023-03-29 DIAGNOSIS — E119 Type 2 diabetes mellitus without complications: Secondary | ICD-10-CM | POA: Diagnosis not present

## 2023-03-29 DIAGNOSIS — I5042 Chronic combined systolic (congestive) and diastolic (congestive) heart failure: Secondary | ICD-10-CM | POA: Insufficient documentation

## 2023-03-29 DIAGNOSIS — E785 Hyperlipidemia, unspecified: Secondary | ICD-10-CM | POA: Diagnosis not present

## 2023-03-29 HISTORY — PX: CARDIOVERSION: SHX1299

## 2023-03-29 SURGERY — CARDIOVERSION
Anesthesia: General

## 2023-03-29 MED ORDER — PROPOFOL 10 MG/ML IV BOLUS
INTRAVENOUS | Status: DC | PRN
Start: 2023-03-29 — End: 2023-03-29
  Administered 2023-03-29: 70 mg via INTRAVENOUS

## 2023-03-29 MED ORDER — LIDOCAINE 2% (20 MG/ML) 5 ML SYRINGE
INTRAMUSCULAR | Status: DC | PRN
  Administered 2023-03-29: 60 mg via INTRAVENOUS

## 2023-03-29 MED ORDER — SODIUM CHLORIDE 0.9 % IV SOLN: INTRAVENOUS | Status: DC

## 2023-03-29 SURGICAL SUPPLY — 1 items: ELECT DEFIB PAD ADLT CADENCE (PAD) ×1 IMPLANT

## 2023-03-29 NOTE — Procedures (Signed)
Electrical Cardioversion Procedure Note [name] [id number] 11/07/1945  Procedure: Electrical Cardioversion Indications:  Atrial Flutter  Procedure Details Consent: Risks of procedure as well as the alternatives and risks of each were explained to the (patient/caregiver).  Consent for procedure obtained. Time Out: Verified patient identification, verified procedure, site/side was marked, verified correct patient position, special equipment/implants available, medications/allergies/relevent history reviewed, required imaging and test results available.  Performed  Patient placed on cardiac monitor, pulse oximetry, supplemental oxygen as necessary.  Sedation given:  Pt sedated by anesthesia with lidocaine 60 mg and diprovan 70 mg IV. Pacer pads placed anterior and posterior chest.  Cardioverted 1 time(s).  Cardioverted at 120J.  Evaluation Findings: Post procedure EKG shows: NSR Complications: None Patient did tolerate procedure well.   Bobby Bradley 03/29/2023, 12:30 PM

## 2023-03-29 NOTE — Interval H&P Note (Signed)
History and Physical Interval Note:  03/29/2023 12:32 PM  Bobby Bradley  has presented today for surgery, with the diagnosis of atrial flutter.  The various methods of treatment have been discussed with the patient and family. After consideration of risks, benefits and other options for treatment, the patient has consented to  Procedure(s): CARDIOVERSION (N/A) as a surgical intervention.  The patient's history has been reviewed, patient examined, no change in status, stable for surgery.  I have reviewed the patient's chart and labs.  Questions were answered to the patient's satisfaction.     [name]

## 2023-03-29 NOTE — Transfer of Care (Signed)
Immediate Anesthesia Transfer of Care Note  Patient: Bobby Bradley  Procedure(s) Performed: CARDIOVERSION  Patient Location: Cath Lab  Anesthesia Type:MAC and General  Level of Consciousness: sedated  Airway & Oxygen Therapy: Patient connected to nasal cannula oxygen  Post-op Assessment: Post -op Vital signs reviewed and stable  Post vital signs: stable  Last Vitals:  Vitals Value Taken Time  BP    Temp    Pulse 50 03/29/23 1312  Resp 15 03/29/23 1312  SpO2 96 % 03/29/23 1312  Vitals shown include unfiled device data.  Last Pain:  Vitals:   03/29/23 1210  TempSrc: Temporal         Complications: No notable events documented.

## 2023-03-29 NOTE — Anesthesia Preprocedure Evaluation (Signed)
Anesthesia Evaluation  Patient identified by MRN, date of birth, ID band Patient awake    Reviewed: Allergy & Precautions, NPO status , Patient's Chart, lab work & pertinent test results  Airway Mallampati: II  TM Distance: >3 FB Neck ROM: Full    Dental no notable dental hx.    Pulmonary former smoker   Pulmonary exam normal        Cardiovascular hypertension, Pt. on home beta blockers and Pt. on medications + CAD, + CABG and +CHF  + dysrhythmias Atrial Fibrillation + pacemaker + Cardiac Defibrillator  Rhythm:Regular Rate:Bradycardia     Neuro/Psych TIACVA  negative psych ROS   GI/Hepatic negative GI ROS, Neg liver ROS,,,  Endo/Other  diabetes, Oral Hypoglycemic Agents    Renal/GU negative Renal ROS     Musculoskeletal negative musculoskeletal ROS (+)    Abdominal   Peds  Hematology  (+) Blood dyscrasia (Eliquis)   Anesthesia Other Findings atrial flutter  Reproductive/Obstetrics                              Anesthesia Physical Anesthesia Plan  ASA: 3  Anesthesia Plan: General   Post-op Pain Management:    Induction: Intravenous  PONV Risk Score and Plan: 2 and Propofol infusion and Treatment may vary due to age or medical condition  Airway Management Planned: Mask  Additional Equipment:   Intra-op Plan:   Post-operative Plan:   Informed Consent: I have reviewed the patients History and Physical, chart, labs and discussed the procedure including the risks, benefits and alternatives for the proposed anesthesia with the patient or authorized representative who has indicated his/her understanding and acceptance.     Dental advisory given  Plan Discussed with: CRNA  Anesthesia Plan Comments:          Anesthesia Quick Evaluation

## 2023-03-29 NOTE — H&P (Signed)
Office Visit 03/19/2023 [facility], [place], [place] [facility] Coronary artery disease involving native coronary artery of native heart without angina pectoris +8 more Dx Referred by Copland, Gwenlyn Found, MD Reason for Visit   Additional Documentation  Vitals: BP 126/58 Important   (BP Location: Left Arm, Patient Position: Sitting, Cuff Size: Normal)   Pulse 52 Important    Ht 6' (1.829 m)   Wt 105.9 kg   SpO2 99%   BMI 31.65 kg/m   BSA 2.32 m  Flowsheets: NEWS,   MEWS Score,   Vital Signs,   Anthropometrics,   Data  Encounter Info: Billing Info,   History,   Allergies,   Detailed Report   All Notes   Progress Notes by Corrin Parker, PA-C at 03/19/2023 10:05 AM  Author: Corrin Parker, PA-C Author Type: Physician Assistant Certified Filed: 03/19/2023 11:07 AM  Note Status: Signed Cosign: Cosign Not Required Encounter Date: 03/19/2023  Editor: Smitty Knudsen (Physician Assistant Certified)             Expand All Collapse All    Cardiology Office Note:     Date:  03/19/2023    ID:  Abdimalik Mayorquin, DOB October 23, 1945, MRN [id number]   PCP:  Pearline Cables, MD           Cardiologist:  [name], MD  Electrophysiologist:  Regan Lemming, MD    Referring MD: Pearline Cables, MD    Chief Complaint: hospital follow-up of CABG   History of Present Illness:     Bobby Bradley is a 77 y.o. male with a history of CAD s/p CABG x4 on 02/26/2023, non-ischemic cardiomyopathy/ chronic combined CHF with EF of 35-40% on Echo in 02/2023, s/p ICD for primary prevention of suddent cardiac death, paroxysmal atrial fibrillation / flutter on Eliquis, CVA, hypertension, hyperlipidemia, and type 2 diabetes mellitus who is followed by Dr. Jens Som and Dr. Elberta Fortis and presents today for hospital follow-up of CABG.    Patient has been followed by Dr. Jens Som since 2018 when he moved to [facility] from [place]. He was diagnosed with atrial fibrillation in 2012 after having a TIA and as started on Coumadin. He later developed atrial flutter as well. He underwent a Myoviewin in 12/2010 which showed no evidence of ischemia but EF was mildly reduced at 43%. He had several Echos following this that showed persistent LV dysfunction with gradually worsening EF to as low as 35%. He ultimately underwent placement of a single chamber ICD in 11/2014 for primary prevention of sudden cardiac death given persistent LV dysfunction. EF subsequently improved to 55-60% on Echo in 08/2022. Coronary calcium score in 11/2022 was elevated at 835 (75th percentile for age and sex). Cardiac PET was performed in 01/2023 for further evaluatoin of progressive dyspnea on exertion and was high risk with findings consistent with ischemia. This led to a R/LHC on 02/12/2023 which showed severe three vessel CAD including moderate disease of the left main. Therefore, he was referred to CT surgery as an outpatient and CABG was recommended. Echo prior to surgery showed a drop in EF to 35-40% with hypokinesis of the inferior, inferolateral, and lateral walls as well as grade 1 diastolic dysfunction. Patient presented to [name] on 02/26/2023 and underwent CABG x4 with LIMA to LAD, SVG to OM, SVG to Diag, and SVG to PDA as well as left atrial appendage clipping with Dr. Cliffton Asters. He did go into  atrial fibrillation following procedure and was started on an Amiodarone drip but remained in atrial fibrillation. He was transitioned to PO Amiodarone and Eliquis at discharge. He was able to be discharged on 6/22/024.   Patient present today for follow-up. Here with wife. He is overall recovering well. He reports some chest wall soreness when he twist his upper body but no angina. He continues to have some shortness of breath with activity but states it is much improved from before. Suspect there is now a deconditioning component. No orthopnea, PND, or edema. No  palpitations, lightheadedness, dizziness, or syncope. No abnormal bleeding in urine or stools.    ICD was interrogated on 03/16/2023 and looks like he has been in sustained atrial flutter after CABG. EKG today showed ventricular paced rhythm with underlying atrial flutter. He is asymptomatic with this.   EKGs/Labs/Other Studies Reviewed:     The following studies were reviewed:   Cardiac PET 01/31/2023:   Severe, large, reversible perfusion defect in the basal to apical inferior/inferolateral/anterolateral segments consistent with ischemia. EF drops with stress (32%->29%). TID is present. MBF is globally reduced (1.25). Overall, this is a high-risk stress test which could be related to multi-vessel obstructive CAD.   LV perfusion is abnormal. There is evidence of ischemia. There is no evidence of infarction. Defect 1: There is a large defect with severe reduction in uptake present in the apical to basal anterolateral, inferior and inferolateral location(s) that is reversible. There is abnormal wall motion in the defect area. Consistent with ischemia.   Rest left ventricular function is abnormal. Rest global function is moderately reduced. Rest EF: 32 %. Stress left ventricular function is abnormal. Stress global function is severely reduced. Stress EF: 29 %. End diastolic cavity size is mildly enlarged.   Myocardial blood flow was computed to be 0.4ml/g/min at rest and 0.5ml/g/min at stress. Global myocardial blood flow reserve was 1.25 and was abnormal.   Coronary calcium was present on the attenuation correction CT images. Moderate coronary calcifications were present. Coronary calcifications were present in the left anterior descending artery and left circumflex artery distribution(s).   Findings are consistent with ischemia. The study is high risk. _______________   Right/ Left Cardiac Catheterization 02/12/2023:   Prox RCA lesion is 90% stenosed.   Ost Cx to Prox Cx lesion is 99% stenosed.    Mid Cx to Dist Cx lesion is 99% stenosed.   Prox LAD lesion is 99% stenosed.   1st Diag lesion is 80% stenosed.   Ost LM to Mid LM lesion is 50% stenosed.   Severe three vessel CAD Moderate left main stenosis Severe mid LAD stenosis at the takeoff of a large first Diagonal branch. Severe ostial stenosis in the large first Diagonal branch.  Severe proximal and mid Circumflex stenosis. Dominant Circumflex. The Circumflex is seen to fill from right to left and left to left collaterals.  Small non-dominant RCA with severe mid vessel stenosis Normal right and left heart pressures   Recommendations: He has severe multi-vessel CAD. He will need a CT surgery consultation for CABG. Will arrange this as an outpatient. Will plan an echo prior to discharge today or within the next week in our office. I will have him start ASA 81 mg daily. Continue statin.    Diagnostic Dominance: Co-dominant   _______________   Echocardiogram 02/12/2023: Impressions: 1. Left ventricular ejection fraction, by estimation, is 35 to 40%. The  left ventricle has moderately decreased function. The left ventricle  demonstrates regional wall  motion abnormalities (see scoring  diagram/findings for description). There is mild  asymmetric left ventricular hypertrophy of the basal-septal segment. Left  ventricular diastolic parameters are consistent with Grade I diastolic  dysfunction (impaired relaxation).   2. Right ventricular systolic function is normal. The right ventricular  size is normal.   3. The mitral valve is normal in structure. Trivial mitral valve  regurgitation. No evidence of mitral stenosis.   4. The aortic valve is tricuspid. There is mild calcification of the  aortic valve. Aortic valve regurgitation is not visualized. Aortic valve  sclerosis/calcification is present, without any evidence of aortic  stenosis.   5. Limited study due to poor sound wave transmission. There seems to be  hypokinesis of the  inferior, inferolateral and lateral walls.    EKG:  EKG ordered today.  EKG Interpretation Date/Time:                  Monday March 19 2023 09:34:41 EDT Ventricular Rate:         52 PR Interval:                   QRS Duration:             164 QT Interval:                 526 QTC Calculation:489 R Axis:                         -84   Text Interpretation:Ventricular paced rhythm with underlying atrial flutter Non-specific T wave changes (due to flutter waves) Confirmed by Marjie Skiff [id number]) on 03/19/2023 10:22:49 AM      Recent Labs: 08/31/2022: TSH 2.110 02/22/2023: ALT 25 02/27/2023: Magnesium 2.3 03/03/2023: BUN 22; Creatinine, Ser 1.13; Hemoglobin 11.0; Platelets 224; Potassium 3.8; Sodium 134  Recent Lipid Panel Labs (Brief)          Component Value Date/Time    CHOL 124 11/13/2022 1345    TRIG 321.0 (H) 11/13/2022 1345    HDL 32.30 (L) 11/13/2022 1345    CHOLHDL 4 11/13/2022 1345    VLDL 64.2 (H) 11/13/2022 1345    LDLCALC 33 01/27/2021 1554    LDLDIRECT 55.0 11/13/2022 1345        Physical Exam:     Vital Signs: BP (!) 126/58 (BP Location: Left Arm, Patient Position: Sitting, Cuff Size: Normal)   Pulse (!) 52   Ht 6' (1.829 m)   Wt 233 lb 6.4 oz (105.9 kg)   SpO2 99%   BMI 31.65 kg/m         Wt Readings from Last 3 Encounters:  03/19/23 233 lb 6.4 oz (105.9 kg)  03/03/23 245 lb 2.4 oz (111.2 kg)  02/23/23 242 lb (109.8 kg)      General: 77 y.o. Caucasian male in no acute distress. HEENT: Normocephalic and atraumatic. Sclera clear. EOMs intact. Neck: Supple. No carotid bruits. No JVD. Heart: RRR. Distinct S1 and S2. No murmurs, gallops, or rubs.  Lungs: No increased work of breathing. Clear to ausculation bilaterally. No wheezes, rhonchi, or rales.  Abdomen: Soft, non-distended, and non-tender to palpation. quadrants.  Extremities: No lower extremity edema.    Skin: Warm and dry. Sternotomy and chest tube incisions healing well. Neuro: Alert and oriented  x3. No focal deficits. Psych: Normal affect. Responds appropriately.     Assessment:     1. Coronary artery disease involving native coronary artery of native heart  without angina pectoris   2. S/P CABG (coronary artery bypass graft)   3. Chronic combined systolic and diastolic CHF (congestive heart failure) (HCC)   4. Atrial flutter, unspecified type (HCC)   5. Paroxysmal atrial fibrillation (HCC)   6. S/P ICD (internal cardiac defibrillator) procedure   7. Primary hypertension   8. Hyperlipidemia, unspecified hyperlipidemia type   9. Type 2 diabetes mellitus with complication, without long-term current use of insulin (HCC)       Plan:     CAD s/p CABG x4 Patient recently underwent CABG x4 on 02/26/2023.  - Recovering well. Some chest soreness but no angina.  - Continue Aspirin 81mg  daily and Crestor 40mg  daily. - Continue to slowly increase physical activity as tolerated.  - Scheduled to see CT Surgery later this month. Recommend Cardiac Rehab when cleared to do so by CT Surgery.    Chronic Combined CHF Patient has a long history of non-ischemic cardiomyopathy with EF as low as 35% in the past. EF later normalized to 60-65% on Echo in 08/2022. However, now seems to have a component of ischemic cardiomyopathy as well. Echo in 02/2023 showed a drop in EF to 35-40% with hypokinesis of the inferior, inferolateral, and lateral walls as well as grade 1 diastolic dysfunction. LHC at that time showed severe multivessel. CAD. - Euvolemic on exam.  - Continue Coreg 12.5mg  daily.  - Continue Spironolactone 25mg  daily.  - Previously on Losartan 100mg  daily prior to CABG. This was stopped during admission. Will restart 25mg  dialy daily. - Will plan to repeat Echo in about 2-3 months to reassess LV function after revascularization and restoration of sinus rhythm.    Paroxysmal Atrial Fibrillation Persistent Atrial Flutter Initially diagnosed in 2012. Last device check in 02/2023 showed rare  paroxymal atrial fibrillation episodes. He went back into atrial fibrillation following CABG and was started on Amiodarone. ICD interrogation on 03/14/2023 showed sustained atrial flutter after CABG. - EKG today shows ventricular paced rhythm with underlying atrial flutter. Asymptomatic. - Continue Amiodarone 200g daily. - Continue Coreg 12.5mg  twice daily.  - Continue chronic anticoagulation with Eliquis 5mg  twice daily.  - Dr. Elberta Fortis felt like he would probably benefit from being back in normal sinus rhythm. Will arrange outpatient DCCV. Eliquis was restarted after CABG on evening of 02/27/2023. DCCV scheduled for 03/29/2023 with Dr. Jens Som. Will check CBC and BMET today.      Informed Consent The risks (stroke, cardiac arrhythmias rarely resulting in the need for a temporary or permanent pacemaker, skin irritation or burns and complications associated with conscious sedation including aspiration, arrhythmia, respiratory failure and death), benefits (restoration of normal sinus rhythm) and alternatives of a direct current cardioversion were explained in detail to Mr. Ludlum and he agrees to proceed.      S/p ICD S/p single chamber ICD in 2016 for primary prevention of sudden cardiac death given CHF. - Followed by Dr. Elberta Fortis.   Hypertension BP well controlled.  - Continue medications for CHF as above.   Hyperlipidemia Direct LDL 55 in 11/2022.  - Continue Crestor 40mg  daily.   Type 2 Diabetes Mellitus Hemoglobin A1c 6.9 in 11/2022. - On Metformin.  - Management per PCP.   Disposition: Keep follow-up with Dr. Jens Som later this month on 04/05/2023.   Signed, Corrin Parker, PA-C  03/19/2023 10:32 AM    [facility]       For DCCV; compliant with apixaban; no changes; [name]

## 2023-03-30 ENCOUNTER — Encounter (HOSPITAL_COMMUNITY): Payer: Self-pay | Admitting: Cardiology

## 2023-03-30 NOTE — Progress Notes (Signed)
[address]       [name] [address]             [phone]    HPI: This is a 77 year old male who underwent a CABG X 4 (LIMA LAD, RSVG Diag, OM, and PDA) with endoscopic right greater saphenous vein harvest, and placement of 45mm Atriclip on 02/26/2023 by Dr. Cliffton Asters. He was discharged in stable condition on 03/03/2023. He presents today for post op follow up.  Current Outpatient Medications  Medication Sig Dispense Refill   amiodarone (PACERONE) 200 MG tablet Take 1 tablet daily 60 tablet 1   apixaban (ELIQUIS) 5 MG TABS tablet TAKE 1 TABLET(5 MG) BY MOUTH TWICE DAILY 180 tablet 1   aspirin EC 81 MG tablet Take 81 mg by mouth daily. Swallow whole.     carvedilol (COREG) 12.5 MG tablet Take 1 tablet (12.5 mg total) by mouth 2 (two) times daily with a meal. 60 tablet 1   cetirizine (ZYRTEC) 10 MG tablet Take 10 mg by mouth daily.     ferrous sulfate 325 (65 FE) MG tablet Take 1 tablet (325 mg total) by mouth daily with breakfast. 90 tablet 3   losartan (COZAAR) 25 MG tablet Take 1 tablet (25 mg total) by mouth daily. 90 tablet 3   metFORMIN (GLUCOPHAGE) 500 MG tablet TAKE 1 TABLET(500 MG) BY MOUTH TWICE DAILY WITH A MEAL 180 tablet 3   oxyCODONE (OXY IR/ROXICODONE) 5 MG immediate release tablet Take 1 tablet (5 mg total) by mouth every 6 (six) hours as needed for severe pain. (Patient taking differently: Take 5 mg by mouth at bedtime.) 28 tablet 0   rosuvastatin (CRESTOR) 40 MG tablet Take 1 tablet (40 mg total) by mouth daily. 90 tablet 3   spironolactone (ALDACTONE) 25 MG tablet Take 0.5 tablets (12.5 mg total) by mouth daily. 45 tablet 3   tetrahydrozoline 0.05 % ophthalmic solution Place 1 drop into both eyes every morning.    Vital Signs: Vitals:   04/09/23 1422  BP: 112/66  Pulse: 61  Resp: 18  SpO2: 97%     Physical Exam: CV-RRR Pulmonary-Clear to auscultation bilaterally Abdomen-Soft, non tender, bowel sounds present Extremities-No LE  edema Wounds-Sternal and RLE wounds are clean, dry, well healed without sign of infection  Diagnostic Tests:  Narrative & Impression  CLINICAL DATA:  Status post CABG 5 weeks ago   EXAM: CHEST - 2 VIEW   COMPARISON:  03/01/2023   FINDINGS: Cardiomediastinal silhouette and pulmonary vasculature are within normal limits.   Lungs are clear.   Postsurgical changes of CABG, left atrial appendage closure, and left chest wall AICD are again seen.   IMPRESSION: No acute cardiopulmonary process.     Electronically Signed   By: Acquanetta Belling M.D.   On: 04/09/2023 14:53  Impression and Plan: We reviewed today's chest x ray results. He was seen by Dr. Jens Som on 07/ 25/2024. His note stated he stopped Losartan and started Entresto. However, patient is still on Losartan. I asked patient to contact Dr. Ludwig Clarks office for clarification as he may have changed his mind on starting Entresto. Dr. Jens Som also recommended a repeat echocardiogram in 3 months to see if LV function has improved following revascularization.  If LV function remains decreased, a SGLT2 inhibitor will be added.  We discussed the importance of good glucose control, driving, sternal precautions, and participation in cardiac rehab (summarized in AVS). He will continue to be followed by cardiology  and will see TCTS PRN.    Ardelle Balls, PA-C [facility] [phone]

## 2023-04-02 NOTE — Progress Notes (Signed)
Remote ICD transmission.   

## 2023-04-05 ENCOUNTER — Encounter: Payer: Self-pay | Admitting: Cardiology

## 2023-04-05 ENCOUNTER — Ambulatory Visit: Payer: Medicare Other | Attending: Cardiology | Admitting: Cardiology

## 2023-04-05 VITALS — BP 120/60 | HR 57 | Ht 72.0 in | Wt 230.2 lb

## 2023-04-05 DIAGNOSIS — I480 Paroxysmal atrial fibrillation: Secondary | ICD-10-CM | POA: Insufficient documentation

## 2023-04-05 DIAGNOSIS — Z95810 Presence of automatic (implantable) cardiac defibrillator: Secondary | ICD-10-CM | POA: Insufficient documentation

## 2023-04-05 DIAGNOSIS — E785 Hyperlipidemia, unspecified: Secondary | ICD-10-CM | POA: Insufficient documentation

## 2023-04-05 DIAGNOSIS — I5042 Chronic combined systolic (congestive) and diastolic (congestive) heart failure: Secondary | ICD-10-CM | POA: Diagnosis not present

## 2023-04-05 DIAGNOSIS — I4892 Unspecified atrial flutter: Secondary | ICD-10-CM | POA: Diagnosis not present

## 2023-04-05 DIAGNOSIS — I2510 Atherosclerotic heart disease of native coronary artery without angina pectoris: Secondary | ICD-10-CM | POA: Diagnosis not present

## 2023-04-05 MED ORDER — ROSUVASTATIN CALCIUM 40 MG PO TABS
40.0000 mg | ORAL_TABLET | Freq: Every day | ORAL | 3 refills | Status: DC
Start: 2023-04-05 — End: 2023-11-29

## 2023-04-05 MED ORDER — SPIRONOLACTONE 25 MG PO TABS: 12.5000 mg | ORAL_TABLET | Freq: Every day | ORAL | 3 refills | Status: DC

## 2023-04-05 MED ORDER — CARVEDILOL 12.5 MG PO TABS: 12.5000 mg | ORAL_TABLET | Freq: Two times a day (BID) | ORAL | 3 refills | Status: DC

## 2023-04-05 MED ORDER — AMIODARONE HCL 200 MG PO TABS: ORAL_TABLET | ORAL | 3 refills | Status: DC

## 2023-04-05 MED ORDER — APIXABAN 5 MG PO TABS: ORAL_TABLET | ORAL | 3 refills | Status: DC

## 2023-04-05 MED ORDER — LOSARTAN POTASSIUM 25 MG PO TABS: 25.0000 mg | ORAL_TABLET | Freq: Every day | ORAL | 3 refills | Status: DC

## 2023-04-05 NOTE — Patient Instructions (Addendum)
    Testing/Procedures:  Your physician has requested that you have an echocardiogram. Echocardiography is a painless test that uses sound waves to create images of your heart. It provides your doctor with information about the size and shape of your heart and how well your heart's chambers and valves are working. This procedure takes approximately one hour. There are no restrictions for this procedure. Please do NOT wear cologne, perfume, aftershave, or lotions (deodorant is allowed). Please arrive 15 minutes prior to your appointment time.[place] OFFICE-1ST FLOOR IMAGING DEPARTMENT-SCHEDULE IN NOVEMBER   Follow-Up: At [facility], you and your health needs are our priority.  As part of our continuing mission to provide you with exceptional heart care, we have created designated Provider Care Teams.  These Care Teams include your primary Cardiologist (physician) and Advanced Practice Providers (APPs -  Physician Assistants and Nurse Practitioners) who all work together to provide you with the care you need, when you need it.  We recommend signing up for the patient portal called "MyChart".  Sign up information is provided on this After Visit Summary.  MyChart is used to connect with patients for Virtual Visits (Telemedicine).  Patients are able to view lab/test results, encounter notes, upcoming appointments, etc.  Non-urgent messages can be sent to your provider as well.   To learn more about what you can do with MyChart, go to [email or web address].    Your next appointment:   4 MONTHS

## 2023-04-06 ENCOUNTER — Other Ambulatory Visit: Payer: Self-pay | Admitting: Thoracic Surgery (Cardiothoracic Vascular Surgery)

## 2023-04-06 DIAGNOSIS — Z951 Presence of aortocoronary bypass graft: Secondary | ICD-10-CM

## 2023-04-08 NOTE — Anesthesia Postprocedure Evaluation (Signed)
Anesthesia Post Note  Patient: Bobby Bradley  Procedure(s) Performed: CARDIOVERSION     Patient location during evaluation: Cath Lab Anesthesia Type: General Level of consciousness: awake Pain management: pain level controlled Vital Signs Assessment: post-procedure vital signs reviewed and stable Respiratory status: spontaneous breathing, nonlabored ventilation and respiratory function stable Cardiovascular status: blood pressure returned to baseline and stable Postop Assessment: no apparent nausea or vomiting Anesthetic complications: no   No notable events documented.  Last Vitals:  Vitals:   03/29/23 1355 03/29/23 1358  BP: 126/61   Pulse: (!) 54 (!) 57  Resp: 18 16  Temp:    SpO2: 96% 97%    Last Pain:  Vitals:   03/29/23 1331  TempSrc: Temporal  PainSc: Asleep                 Pachia Strum P Mariaisabel Bodiford

## 2023-04-09 ENCOUNTER — Encounter: Payer: Self-pay | Admitting: Physician Assistant

## 2023-04-09 ENCOUNTER — Other Ambulatory Visit: Payer: Self-pay | Admitting: *Deleted

## 2023-04-09 ENCOUNTER — Ambulatory Visit: Payer: Medicare Other

## 2023-04-09 ENCOUNTER — Ambulatory Visit
Admission: RE | Admit: 2023-04-09 | Discharge: 2023-04-09 | Disposition: A | Discharge status: Home / Self Care | Payer: Medicare Other | Source: Ambulatory Visit | Attending: Thoracic Surgery (Cardiothoracic Vascular Surgery) | Admitting: Thoracic Surgery (Cardiothoracic Vascular Surgery)

## 2023-04-09 VITALS — BP 112/66 | HR 61 | Resp 18 | Ht 72.0 in | Wt 230.0 lb

## 2023-04-09 DIAGNOSIS — Z951 Presence of aortocoronary bypass graft: Secondary | ICD-10-CM

## 2023-04-09 NOTE — Patient Instructions (Signed)
Make every effort to keep your diabetes under very tight control.  Follow up closely with your primary care physician or endocrinologist and strive to keep their hemoglobin A1c levels as low as possible, preferably near or below 6.0.  The long term benefits of strict control of diabetes are far reaching and critically important for your overall health and survival. His pre op HGA1C was 6.4 2. You may return to driving an automobile as long as you are no longer requiring oral narcotic pain relievers during the daytime.  It would be wise to start driving only short distances during the daylight and gradually increase from there as you feel comfortable. 3. Continue to avoid any heavy lifting or strenuous use of your arms or shoulders for at least a total of three months from the time of surgery.  After three months you may gradually increase how much you lift or otherwise use your arms or chest as tolerated, with limits based upon whether or not activities lead to the return of significant discomfort. 4. You are encouraged to enroll and participate in the outpatient cardiac rehab program beginning as soon as practical.

## 2023-04-09 NOTE — Progress Notes (Signed)
Per Jacques Earthly, PA, cardiac rehab referral faxed to [facility] at [phone].

## 2023-04-10 ENCOUNTER — Encounter: Payer: Self-pay | Admitting: Cardiology

## 2023-04-10 DIAGNOSIS — I5042 Chronic combined systolic (congestive) and diastolic (congestive) heart failure: Secondary | ICD-10-CM

## 2023-04-10 NOTE — Telephone Encounter (Signed)
Per last OV note states -discontinue losartan and treat with Entresto 24/26 twice daily  However Losartan was resent and Entresto was not.  Please clarify that the entresto is to be started and DC Losartan

## 2023-04-30 ENCOUNTER — Other Ambulatory Visit: Payer: Self-pay | Admitting: Cardiology

## 2023-05-22 ENCOUNTER — Encounter: Payer: Self-pay | Admitting: *Deleted

## 2023-05-22 MED ORDER — SACUBITRIL-VALSARTAN 24-26 MG PO TABS: 1.0000 | ORAL_TABLET | Freq: Two times a day (BID) | ORAL | 11 refills | Status: DC

## 2023-05-22 NOTE — Telephone Encounter (Signed)
Spoke with pt, Aware of dr crenshaw's recommendations.  ?New script sent to the pharmacy  ?Lab orders mailed to the pt  ?

## 2023-05-22 NOTE — Telephone Encounter (Signed)
This encounter was created in error - please disregard.

## 2023-05-23 ENCOUNTER — Telehealth: Payer: Self-pay

## 2023-05-23 ENCOUNTER — Other Ambulatory Visit (HOSPITAL_COMMUNITY): Payer: Self-pay

## 2023-05-23 NOTE — Telephone Encounter (Signed)
Pharmacy Patient Advocate Encounter   Received notification from CoverMyMeds that prior authorization for ENTRESTO is required/requested.   Insurance verification completed.   The patient is insured through [facility] .   Per test claim: PA required; PA submitted to [facility] via CoverMyMeds Key/confirmation #/EOC [id number] Status is pending

## 2023-05-23 NOTE — Telephone Encounter (Signed)
Pharmacy Patient Advocate Encounter  Received notification from [facility] that Prior Authorization for ENTRESTO has been APPROVED from 05/23/23 to 05/22/24. Ran test claim, Copay is $175. This test claim was processed through [facility]- copay amounts may vary at other pharmacies due to pharmacy/plan contracts, or as the patient moves through the different stages of their insurance plan.

## 2023-05-31 DIAGNOSIS — I5042 Chronic combined systolic (congestive) and diastolic (congestive) heart failure: Secondary | ICD-10-CM | POA: Diagnosis not present

## 2023-06-01 LAB — BASIC METABOLIC PANEL
BUN/Creatinine Ratio: 11 (ref 10–24)
BUN: 14 mg/dL (ref 8–27)
CO2: 22 mmol/L (ref 20–29)
Calcium: 9.3 mg/dL (ref 8.6–10.2)
Chloride: 103 mmol/L (ref 96–106)
Creatinine, Ser: 1.23 mg/dL (ref 0.76–1.27)
Glucose: 113 mg/dL — ABNORMAL HIGH (ref 70–99)
Potassium: 5.4 mmol/L — ABNORMAL HIGH (ref 3.5–5.2)
Sodium: 140 mmol/L (ref 134–144)
eGFR: 61 mL/min/{1.73_m2} (ref >59)

## 2023-06-04 ENCOUNTER — Other Ambulatory Visit: Payer: Self-pay | Admitting: *Deleted

## 2023-06-04 DIAGNOSIS — E875 Hyperkalemia: Secondary | ICD-10-CM

## 2023-06-05 DIAGNOSIS — E119 Type 2 diabetes mellitus without complications: Secondary | ICD-10-CM | POA: Diagnosis not present

## 2023-06-05 DIAGNOSIS — Z951 Presence of aortocoronary bypass graft: Secondary | ICD-10-CM | POA: Diagnosis not present

## 2023-06-06 DIAGNOSIS — E119 Type 2 diabetes mellitus without complications: Secondary | ICD-10-CM | POA: Diagnosis not present

## 2023-06-06 DIAGNOSIS — Z951 Presence of aortocoronary bypass graft: Secondary | ICD-10-CM | POA: Diagnosis not present

## 2023-06-07 DIAGNOSIS — E119 Type 2 diabetes mellitus without complications: Secondary | ICD-10-CM | POA: Diagnosis not present

## 2023-06-07 DIAGNOSIS — Z951 Presence of aortocoronary bypass graft: Secondary | ICD-10-CM | POA: Diagnosis not present

## 2023-06-11 ENCOUNTER — Ambulatory Visit (INDEPENDENT_AMBULATORY_CARE_PROVIDER_SITE_OTHER): Payer: Medicare Other

## 2023-06-11 DIAGNOSIS — E119 Type 2 diabetes mellitus without complications: Secondary | ICD-10-CM | POA: Diagnosis not present

## 2023-06-11 DIAGNOSIS — Z951 Presence of aortocoronary bypass graft: Secondary | ICD-10-CM | POA: Diagnosis not present

## 2023-06-11 DIAGNOSIS — I480 Paroxysmal atrial fibrillation: Secondary | ICD-10-CM

## 2023-06-11 LAB — CUP PACEART REMOTE DEVICE CHECK
Battery Remaining Longevity: 96 mo
Battery Remaining Percentage: 87 %
Brady Statistic RV Percent Paced: 16 %
Date Time Interrogation Session: 20240930054300
HighPow Impedance: 48 Ohm
Implantable Lead Connection Status: 753985
Implantable Lead Implant Date: 20160308
Implantable Lead Location: 753860
Implantable Lead Model: 295
Implantable Lead Serial Number: 363165
Implantable Pulse Generator Implant Date: 20160308
Lead Channel Impedance Value: 392 Ohm
Lead Channel Pacing Threshold Amplitude: 0.6 V
Lead Channel Pacing Threshold Pulse Width: 0.5 ms
Lead Channel Setting Pacing Amplitude: 2.4 V
Lead Channel Setting Pacing Pulse Width: 0.5 ms
Lead Channel Setting Sensing Sensitivity: 0.6 mV
Pulse Gen Serial Number: 200269

## 2023-06-13 DIAGNOSIS — Z951 Presence of aortocoronary bypass graft: Secondary | ICD-10-CM | POA: Diagnosis not present

## 2023-06-14 DIAGNOSIS — Z951 Presence of aortocoronary bypass graft: Secondary | ICD-10-CM | POA: Diagnosis not present

## 2023-06-15 DIAGNOSIS — E875 Hyperkalemia: Secondary | ICD-10-CM | POA: Diagnosis not present

## 2023-06-16 LAB — BASIC METABOLIC PANEL
BUN/Creatinine Ratio: 17 (ref 10–24)
BUN: 19 mg/dL (ref 8–27)
CO2: 22 mmol/L (ref 20–29)
Calcium: 9 mg/dL (ref 8.6–10.2)
Chloride: 105 mmol/L (ref 96–106)
Creatinine, Ser: 1.14 mg/dL (ref 0.76–1.27)
Glucose: 126 mg/dL — ABNORMAL HIGH (ref 70–99)
Potassium: 4.4 mmol/L (ref 3.5–5.2)
Sodium: 141 mmol/L (ref 134–144)
eGFR: 67 mL/min/{1.73_m2} (ref >59)

## 2023-06-18 DIAGNOSIS — Z951 Presence of aortocoronary bypass graft: Secondary | ICD-10-CM | POA: Diagnosis not present

## 2023-06-20 DIAGNOSIS — Z951 Presence of aortocoronary bypass graft: Secondary | ICD-10-CM | POA: Diagnosis not present

## 2023-06-21 DIAGNOSIS — Z951 Presence of aortocoronary bypass graft: Secondary | ICD-10-CM | POA: Diagnosis not present

## 2023-06-25 DIAGNOSIS — Z951 Presence of aortocoronary bypass graft: Secondary | ICD-10-CM | POA: Diagnosis not present

## 2023-06-26 NOTE — Progress Notes (Signed)
Remote ICD transmission.   

## 2023-06-27 DIAGNOSIS — Z951 Presence of aortocoronary bypass graft: Secondary | ICD-10-CM | POA: Diagnosis not present

## 2023-06-28 DIAGNOSIS — Z951 Presence of aortocoronary bypass graft: Secondary | ICD-10-CM | POA: Diagnosis not present

## 2023-07-02 DIAGNOSIS — Z951 Presence of aortocoronary bypass graft: Secondary | ICD-10-CM | POA: Diagnosis not present

## 2023-07-04 DIAGNOSIS — Z951 Presence of aortocoronary bypass graft: Secondary | ICD-10-CM | POA: Diagnosis not present

## 2023-07-05 DIAGNOSIS — Z951 Presence of aortocoronary bypass graft: Secondary | ICD-10-CM | POA: Diagnosis not present

## 2023-07-09 DIAGNOSIS — Z951 Presence of aortocoronary bypass graft: Secondary | ICD-10-CM | POA: Diagnosis not present

## 2023-07-11 DIAGNOSIS — Z951 Presence of aortocoronary bypass graft: Secondary | ICD-10-CM | POA: Diagnosis not present

## 2023-07-12 DIAGNOSIS — Z951 Presence of aortocoronary bypass graft: Secondary | ICD-10-CM | POA: Diagnosis not present

## 2023-07-16 DIAGNOSIS — Z951 Presence of aortocoronary bypass graft: Secondary | ICD-10-CM | POA: Diagnosis not present

## 2023-07-17 ENCOUNTER — Ambulatory Visit (HOSPITAL_BASED_OUTPATIENT_CLINIC_OR_DEPARTMENT_OTHER)
Admission: RE | Admit: 2023-07-17 | Discharge: 2023-07-17 | Disposition: A | Discharge status: Home / Self Care | Payer: Medicare Other | Source: Ambulatory Visit | Attending: Cardiology | Admitting: Cardiology

## 2023-07-17 DIAGNOSIS — I5042 Chronic combined systolic (congestive) and diastolic (congestive) heart failure: Secondary | ICD-10-CM | POA: Diagnosis not present

## 2023-07-18 ENCOUNTER — Encounter: Payer: Self-pay | Admitting: Gastroenterology

## 2023-07-18 ENCOUNTER — Other Ambulatory Visit: Payer: Self-pay | Admitting: *Deleted

## 2023-07-18 DIAGNOSIS — D509 Iron deficiency anemia, unspecified: Secondary | ICD-10-CM

## 2023-07-18 DIAGNOSIS — Z951 Presence of aortocoronary bypass graft: Secondary | ICD-10-CM | POA: Diagnosis not present

## 2023-07-18 LAB — ECHOCARDIOGRAM COMPLETE
AR max vel: 1.95 cm2
AV Area VTI: 1.92 cm2
AV Area mean vel: 1.96 cm2
AV Mean grad: 4 mm[Hg]
AV Peak grad: 8.1 mm[Hg]
Ao pk vel: 1.42 m/s
Area-P 1/2: 5.97 cm2
Calc EF: 40.7 %
MV M vel: 3.45 m/s
MV Peak grad: 47.6 mm[Hg]
S' Lateral: 4 cm
Single Plane A2C EF: 41 %
Single Plane A4C EF: 40.1 %

## 2023-07-19 DIAGNOSIS — Z951 Presence of aortocoronary bypass graft: Secondary | ICD-10-CM | POA: Diagnosis not present

## 2023-07-19 NOTE — Progress Notes (Signed)
HPI: FU CAD, atrial fibrillation and CHF. Also with prior ICD. Previously moved from [place].  Has history of paroxysmal atrial fibrillation and TIA.  Abdominal ultrasound February 2018 showed no aneurysm. PET scan Jan 31, 2023 showed large reversible defect in the apical inferior, inferolateral, anterolateral segment with ejection fraction dropping from 32% to 29% with exercise and study felt to be high risk.  Cardiac catheterization June 2024 showed severe three-vessel coronary artery disease.  Carotid Dopplers June 2024 showed no significant stenosis.  Patient had coronary artery bypass and graft on February 26, 2023 with a LIMA to the LAD, saphenous vein graft to the diagonal, saphenous vein graft to the obtuse marginal and saphenous vein graft to the PDA.  Postop course complicated by atrial fibrillation and he was placed on amiodarone.  He was also started on apixaban.  Patient subsequently underwent cardioversion to sinus rhythm.  Echocardiogram November 2024 showed ejection fraction 45 to 50%, grade 2 diastolic dysfunction, mild left atrial enlargement.  Since last seen the patient denies any dyspnea on exertion, orthopnea, PND, pedal edema, palpitations, syncope or chest pain.   Current Outpatient Medications  Medication Sig Dispense Refill   amiodarone (PACERONE) 200 MG tablet Take 1 tablet daily 90 tablet 3   apixaban (ELIQUIS) 5 MG TABS tablet TAKE 1 TABLET(5 MG) BY MOUTH TWICE DAILY 180 tablet 3   aspirin EC 81 MG tablet Take 81 mg by mouth daily. Swallow whole.     carvedilol (COREG) 12.5 MG tablet TAKE 1 TABLET(12.5 MG) BY MOUTH TWICE DAILY WITH A MEAL 180 tablet 3   cetirizine (ZYRTEC) 10 MG tablet Take 10 mg by mouth daily.     ferrous sulfate 325 (65 FE) MG tablet Take 1 tablet (325 mg total) by mouth daily with breakfast. 90 tablet 3   metFORMIN (GLUCOPHAGE) 500 MG tablet TAKE 1 TABLET(500 MG) BY MOUTH TWICE DAILY WITH A MEAL 180 tablet 3   rosuvastatin (CRESTOR) 40 MG tablet  Take 1 tablet (40 mg total) by mouth daily. 90 tablet 3   sacubitril-valsartan (ENTRESTO) 24-26 MG Take 1 tablet by mouth 2 (two) times daily. 60 tablet 11   tetrahydrozoline 0.05 % ophthalmic solution Place 1 drop into both eyes every morning.     No current facility-administered medications for this visit.     Past Medical History:  Diagnosis Date   A-fib [facility])    AICD (automatic cardioverter/defibrillator) present    CHF (congestive heart failure) (HCC)    Colon polyps    Diabetes mellitus without complication (HCC)    Dysrhythmia    Essential hypertension    Hyperlipidemia    Non-ischemic cardiomyopathy (HCC)    Presence of combination internal cardiac defibrillator (ICD) and pacemaker    Presence of permanent cardiac pacemaker    Prostate cancer (HCC)    Stroke (HCC)    TIA- 1994   TIA (transient ischemic attack)     Past Surgical History:  Procedure Laterality Date   BACK SURGERY     CARDIAC CATHETERIZATION     CARDIOVERSION N/A 03/29/2023   Procedure: CARDIOVERSION;  Surgeon: Lewayne Bunting, MD;  Location: [facility] INVASIVE CV [facility];  Service: Cardiovascular;  Laterality: N/A;   CLIPPING OF ATRIAL APPENDAGE N/A 02/26/2023   Procedure: CLIPPING OF LEFT ATRIAL APPENDAGE;  Surgeon: Corliss Skains, MD;  Location: [facility] OR;  Service: Open Heart Surgery;  Laterality: N/A;   COLONOSCOPY     about 10 years. Done in [address] or [place]  CORONARY ARTERY BYPASS GRAFT N/A 02/26/2023   Procedure: CORONARY ARTERY BYPASS GRAFTING (CABG) X 4 WITH LEFT INTERNAL MAMMARY ARTERY HARVEST AND RIGHT GREATER SAPHENOUS VEIN HARVESTED ENDOSCOPICALLY;  Surgeon: Corliss Skains, MD;  Location: [facility] OR;  Service: Open Heart Surgery;  Laterality: N/A;   ICD IMPLANT  2012   PROSTATECTOMY     RIGHT/LEFT HEART CATH AND CORONARY ANGIOGRAPHY N/A 02/12/2023   Procedure: RIGHT/LEFT HEART CATH AND CORONARY ANGIOGRAPHY;  Surgeon: Kathleene Hazel, MD;  Location: [facility];   Service: Cardiovascular;  Laterality: N/A;   TEE WITHOUT CARDIOVERSION N/A 02/26/2023   Procedure: TRANSESOPHAGEAL ECHOCARDIOGRAM;  Surgeon: Corliss Skains, MD;  Location: [facility] OR;  Service: Open Heart Surgery;  Laterality: N/A;    Social History   Socioeconomic History   Marital status: Married    Spouse name: Not on file   Number of children: 7   Years of education: Not on file   Highest education level: Not on file  Occupational History   Occupation: Print production planner   Tobacco Use   Smoking status: Former    Current packs/day: 0.00    Types: Cigarettes    Quit date: 09/12/1983    Years since quitting: 39.9    Passive exposure: Never   Smokeless tobacco: Never  Vaping Use   Vaping status: Never Used  Substance and Sexual Activity   Alcohol use: Yes    Comment: very rare   Drug use: No   Sexual activity: Not Currently  Other Topics Concern   Not on file  Social History Narrative   Not on file   Social Determinants of Health   Financial Resource Strain: Low Risk  (07/13/2021)   Overall Financial Resource Strain (CARDIA)    Difficulty of Paying Living Expenses: Not hard at all  Food Insecurity: No Food Insecurity (03/06/2023)   Hunger Vital Sign    Worried About Running Out of Food in the Last Year: Never true    Ran Out of Food in the Last Year: Never true  Transportation Needs: No Transportation Needs (03/06/2023)   PRAPARE - Administrator, Civil Service (Medical): No    Lack of Transportation (Non-Medical): No  Physical Activity: Inactive (07/13/2021)   Exercise Vital Sign    Days of Exercise per Week: 0 days    Minutes of Exercise per Session: 0 min  Stress: No Stress Concern Present (07/13/2021)   [employer] of Occupational Health - Occupational Stress Questionnaire    Feeling of Stress : Not at all  Social Connections: Unknown (09/02/2022)   Received from [facility], [facility]   Social Network    Social Network: Not on file   Intimate Partner Violence: Not At Risk (02/26/2023)   Humiliation, Afraid, Rape, and Kick questionnaire    Fear of Current or Ex-Partner: No    Emotionally Abused: No    Physically Abused: No    Sexually Abused: No    Family History  Problem Relation Age of Onset   Lung cancer Mother    Uterine cancer Mother    Lung cancer Father    Atrial fibrillation Sister    Lung cancer Brother    Colon cancer Neg Hx    Esophageal cancer Neg Hx    Pancreatic cancer Neg Hx    Liver disease Neg Hx    Stomach cancer Neg Hx    Rectal cancer Neg Hx     ROS: no fevers or chills, productive cough, hemoptysis,  dysphasia, odynophagia, melena, hematochezia, dysuria, hematuria, rash, seizure activity, orthopnea, PND, pedal edema, claudication. Remaining systems are negative.  Physical Exam: Well-developed well-nourished in no acute distress.  Skin is warm and dry.  HEENT is normal.  Neck is supple.  Chest is clear to auscultation with normal expansion.  Cardiovascular exam is regular rate and rhythm.  Abdominal exam nontender or distended. No masses palpated. Extremities show no edema. neuro grossly intact   A/P  1 coronary artery disease status post coronary artery bypass and graft-plan to continue medical therapy with aspirin and statin.  2 ischemic cardiomyopathy-LV function mildly reduced on recent echocardiogram.  Continue Entresto, carvedilol.  3 paroxysmal atrial fibrillation/flutter-patient remains in sinus rhythm following previous cardioversion.  Continue amiodarone and apixaban. In 8-12 weeks, check bmet, TSH, LFTs.   4 hypertension-blood pressure controlled.  Continue present medical regimen.  5 hyperlipidemia-continue statin. Check lipids and liver.   6 ICD-followed by electrophysiology.  [name], MD

## 2023-07-23 DIAGNOSIS — Z951 Presence of aortocoronary bypass graft: Secondary | ICD-10-CM | POA: Diagnosis not present

## 2023-07-24 ENCOUNTER — Other Ambulatory Visit (INDEPENDENT_AMBULATORY_CARE_PROVIDER_SITE_OTHER): Payer: Medicare Other

## 2023-07-24 DIAGNOSIS — D509 Iron deficiency anemia, unspecified: Secondary | ICD-10-CM

## 2023-07-24 LAB — CBC WITH DIFFERENTIAL/PLATELET
Basophils Absolute: 0.1 10*3/uL (ref 0.0–0.1)
Basophils Relative: 0.7 % (ref 0.0–3.0)
Eosinophils Absolute: 0.3 10*3/uL (ref 0.0–0.7)
Eosinophils Relative: 3.7 % (ref 0.0–5.0)
HCT: 43.6 % (ref 39.0–52.0)
Hemoglobin: 14.5 g/dL (ref 13.0–17.0)
Lymphocytes Relative: 17.9 % (ref 12.0–46.0)
Lymphs Abs: 1.5 10*3/uL (ref 0.7–4.0)
MCHC: 33.2 g/dL (ref 30.0–36.0)
MCV: 90.5 fL (ref 78.0–100.0)
Monocytes Absolute: 0.9 10*3/uL (ref 0.1–1.0)
Monocytes Relative: 10.8 % (ref 3.0–12.0)
Neutro Abs: 5.4 10*3/uL (ref 1.4–7.7)
Neutrophils Relative %: 66.9 % (ref 43.0–77.0)
Platelets: 208 10*3/uL (ref 150.0–400.0)
RBC: 4.81 Mil/uL (ref 4.22–5.81)
RDW: 15.2 % (ref 11.5–15.5)
WBC: 8.1 10*3/uL (ref 4.0–10.5)

## 2023-07-24 LAB — IBC + FERRITIN
Ferritin: 12 ng/mL — ABNORMAL LOW (ref 22.0–322.0)
Iron: 80 ug/dL (ref 42–165)
Saturation Ratios: 19 % — ABNORMAL LOW (ref 20.0–50.0)
TIBC: 421.4 ug/dL (ref 250.0–450.0)
Transferrin: 301 mg/dL (ref 212.0–360.0)

## 2023-07-25 DIAGNOSIS — Z951 Presence of aortocoronary bypass graft: Secondary | ICD-10-CM | POA: Diagnosis not present

## 2023-07-26 ENCOUNTER — Encounter: Payer: Self-pay | Admitting: *Deleted

## 2023-07-26 ENCOUNTER — Other Ambulatory Visit: Payer: Self-pay | Admitting: *Deleted

## 2023-07-26 DIAGNOSIS — Z951 Presence of aortocoronary bypass graft: Secondary | ICD-10-CM | POA: Diagnosis not present

## 2023-07-26 DIAGNOSIS — D509 Iron deficiency anemia, unspecified: Secondary | ICD-10-CM

## 2023-07-26 NOTE — Progress Notes (Signed)
Cbc

## 2023-07-30 DIAGNOSIS — Z951 Presence of aortocoronary bypass graft: Secondary | ICD-10-CM | POA: Diagnosis not present

## 2023-08-01 ENCOUNTER — Ambulatory Visit: Payer: Medicare Other | Attending: Cardiology | Admitting: Cardiology

## 2023-08-01 ENCOUNTER — Encounter: Payer: Self-pay | Admitting: Cardiology

## 2023-08-01 VITALS — BP 134/68 | HR 54 | Ht 72.0 in | Wt 235.4 lb

## 2023-08-01 DIAGNOSIS — Z95810 Presence of automatic (implantable) cardiac defibrillator: Secondary | ICD-10-CM | POA: Insufficient documentation

## 2023-08-01 DIAGNOSIS — I2510 Atherosclerotic heart disease of native coronary artery without angina pectoris: Secondary | ICD-10-CM | POA: Insufficient documentation

## 2023-08-01 DIAGNOSIS — I10 Essential (primary) hypertension: Secondary | ICD-10-CM | POA: Insufficient documentation

## 2023-08-01 DIAGNOSIS — E785 Hyperlipidemia, unspecified: Secondary | ICD-10-CM | POA: Diagnosis not present

## 2023-08-01 DIAGNOSIS — I480 Paroxysmal atrial fibrillation: Secondary | ICD-10-CM | POA: Diagnosis not present

## 2023-08-01 NOTE — Patient Instructions (Signed)
    Lab Work:Your physician recommends that you return for lab work in: 2-3 months  [employer] on the 3 rd floor in [address] Hours-Monday - Friday 8 am-11:30 am and 1 pm -4 pm   If you have labs (blood work) drawn today and your tests are completely normal, you will receive your results only by: MyChart Message (if you have MyChart) OR A paper copy in the mail If you have any lab test that is abnormal or we need to change your treatment, we will call you to review the results.   Follow-Up: At [facility], you and your health needs are our priority.  As part of our continuing mission to provide you with exceptional heart care, we have created designated Provider Care Teams.  These Care Teams include your primary Cardiologist (physician) and Advanced Practice Providers (APPs -  Physician Assistants and Nurse Practitioners) who all work together to provide you with the care you need, when you need it.  We recommend signing up for the patient portal called "MyChart".  Sign up information is provided on this After Visit Summary.  MyChart is used to connect with patients for Virtual Visits (Telemedicine).  Patients are able to view lab/test results, encounter notes, upcoming appointments, etc.  Non-urgent messages can be sent to your provider as well.   To learn more about what you can do with MyChart, go to [email or web address].    Your next appointment:   6 month(s)  Provider:   [name], MD

## 2023-08-02 DIAGNOSIS — Z951 Presence of aortocoronary bypass graft: Secondary | ICD-10-CM | POA: Diagnosis not present

## 2023-08-03 ENCOUNTER — Ambulatory Visit: Payer: Medicare Other | Admitting: Gastroenterology

## 2023-08-03 ENCOUNTER — Encounter: Payer: Self-pay | Admitting: *Deleted

## 2023-08-03 DIAGNOSIS — Q2733 Arteriovenous malformation of digestive system vessel: Secondary | ICD-10-CM

## 2023-08-03 DIAGNOSIS — K633 Ulcer of intestine: Secondary | ICD-10-CM | POA: Diagnosis not present

## 2023-08-03 DIAGNOSIS — D509 Iron deficiency anemia, unspecified: Secondary | ICD-10-CM | POA: Diagnosis not present

## 2023-08-03 DIAGNOSIS — K449 Diaphragmatic hernia without obstruction or gangrene: Secondary | ICD-10-CM

## 2023-08-03 NOTE — Patient Instructions (Signed)
You may have clear liquids beginning at 10:30 am after ingesting the capsule.    You can have a light lunch at 12:30 pm; sandwich and half bowl of soup.  Return to the office at 4 pm to return the equipment.   Return to you normal diet at 5 pm.   Call [phone] and ask for Hilma Favors BSN RN if you have any questions.  You should pass the capsule in your stool 8-48 hours after ingestion. If you have not passed the capsule, after 72 hours, please contact the office at [phone].

## 2023-08-03 NOTE — Progress Notes (Signed)
SN: [id number] Exp: 07/14/2024 LOT: [id number]  Patient arrived for VCE. Reported the prep went well. This RN explained capsule dietary restrictions for the next few hours. Pt advised to return at 4 pm to return capsule equipment.  Patient verbalized understanding. Opened capsule, ensured capsule was flashing prior to the patient swallowing the capsule. Patient swallowed capsule without difficulty.  Patient told to call the office with any questions and if capsule has not passed after 72 hours. No further questions by the conclusion of the visit.

## 2023-08-06 DIAGNOSIS — Z951 Presence of aortocoronary bypass graft: Secondary | ICD-10-CM | POA: Diagnosis not present

## 2023-08-07 ENCOUNTER — Other Ambulatory Visit: Payer: Self-pay | Admitting: Cardiology

## 2023-08-07 DIAGNOSIS — I10 Essential (primary) hypertension: Secondary | ICD-10-CM

## 2023-08-08 DIAGNOSIS — Z951 Presence of aortocoronary bypass graft: Secondary | ICD-10-CM | POA: Diagnosis not present

## 2023-08-13 DIAGNOSIS — Z951 Presence of aortocoronary bypass graft: Secondary | ICD-10-CM | POA: Diagnosis not present

## 2023-08-14 ENCOUNTER — Ambulatory Visit (INDEPENDENT_AMBULATORY_CARE_PROVIDER_SITE_OTHER): Payer: Medicare Other

## 2023-08-14 VITALS — Ht 72.0 in | Wt 235.0 lb

## 2023-08-14 DIAGNOSIS — Z0000 Encounter for general adult medical examination without abnormal findings: Secondary | ICD-10-CM | POA: Diagnosis not present

## 2023-08-14 NOTE — Progress Notes (Signed)
Subjective:   Bobby Bradley is a 77 y.o. male who presents for Medicare Annual/Subsequent preventive examination.  Visit Complete: Virtual I connected with  [name] on 08/14/23 by a audio enabled telemedicine application and verified that I am speaking with the correct person using two identifiers.  Patient Location: Home  Provider Location: Home Office  I discussed the limitations of evaluation and management by telemedicine. The patient expressed understanding and agreed to proceed.  Vital Signs: Because this visit was a virtual/telehealth visit, some criteria may be missing or patient reported. Any vitals not documented were not able to be obtained and vitals that have been documented are patient reported.  Patient Medicare AWV questionnaire was completed by the patient on 08/07/23; I have confirmed that all information answered by patient is correct and no changes since this date.  Cardiac Risk Factors include: advanced age (>41men, >42 women);hypertension;diabetes mellitus;male gender     Objective:    Today's Vitals   08/14/23 1314  Weight: 235 lb (106.6 kg)  Height: 6' (1.829 m)   Body mass index is 31.87 kg/m.     08/14/2023    1:22 PM 03/29/2023   12:19 PM 02/26/2023    1:30 PM 02/22/2023   11:02 AM 02/12/2023    7:56 AM 07/13/2021    3:43 PM  Advanced Directives  Does Patient Have a Medical Advance Directive? No No No No No No  Would patient like information on creating a medical advance directive? No - Patient declined  No - Patient declined No - Patient declined No - Patient declined No - Patient declined    Current Medications (verified) Outpatient Encounter Medications as of 08/14/2023  Medication Sig   amiodarone (PACERONE) 200 MG tablet Take 1 tablet daily   apixaban (ELIQUIS) 5 MG TABS tablet TAKE 1 TABLET(5 MG) BY MOUTH TWICE DAILY   aspirin EC 81 MG tablet Take 81 mg by mouth daily. Swallow whole.   carvedilol (COREG) 25 MG tablet TAKE 1  TABLET(25 MG) BY MOUTH TWICE DAILY   cetirizine (ZYRTEC) 10 MG tablet Take 10 mg by mouth daily.   ferrous sulfate 325 (65 FE) MG tablet Take 1 tablet (325 mg total) by mouth daily with breakfast.   metFORMIN (GLUCOPHAGE) 500 MG tablet TAKE 1 TABLET(500 MG) BY MOUTH TWICE DAILY WITH A MEAL   rosuvastatin (CRESTOR) 40 MG tablet Take 1 tablet (40 mg total) by mouth daily.   sacubitril-valsartan (ENTRESTO) 24-26 MG Take 1 tablet by mouth 2 (two) times daily.   tetrahydrozoline 0.05 % ophthalmic solution Place 1 drop into both eyes every morning.   [DISCONTINUED] carvedilol (COREG) 12.5 MG tablet TAKE 1 TABLET(12.5 MG) BY MOUTH TWICE DAILY WITH A MEAL   No facility-administered encounter medications on file as of 08/14/2023.    Allergies (verified) Lisinopril   History: Past Medical History:  Diagnosis Date   A-fib (HCC)    AICD (automatic cardioverter/defibrillator) present    CHF (congestive heart failure) (HCC)    Colon polyps    Diabetes mellitus without complication (HCC)    Dysrhythmia    Essential hypertension    Hyperlipidemia    Non-ischemic cardiomyopathy (HCC)    Presence of combination internal cardiac defibrillator (ICD) and pacemaker    Presence of permanent cardiac pacemaker    Prostate cancer (HCC)    Stroke (HCC)    TIA- 1994   TIA (transient ischemic attack)    Past Surgical History:  Procedure Laterality Date   BACK SURGERY  CARDIAC CATHETERIZATION     CARDIOVERSION N/A 03/29/2023   Procedure: CARDIOVERSION;  Surgeon: Lewayne Bunting, MD;  [facility] INVASIVE CV LAB;  Service: Cardiovascular;  Laterality: N/A;   CLIPPING OF ATRIAL APPENDAGE N/A 02/26/2023   Procedure: CLIPPING OF LEFT ATRIAL APPENDAGE;  Surgeon: Corliss Skains, MD;  Location: [facility] OR;  Service: Open Heart Surgery;  Laterality: N/A;   COLONOSCOPY     about 10 years. Done in [place] or [place]    CORONARY ARTERY BYPASS GRAFT N/A 02/26/2023   Procedure: CORONARY ARTERY  BYPASS GRAFTING (CABG) X 4 WITH LEFT INTERNAL MAMMARY ARTERY HARVEST AND RIGHT GREATER SAPHENOUS VEIN HARVESTED ENDOSCOPICALLY;  Surgeon: Corliss Skains, MD;  Location: [facility] OR;  Service: Open Heart Surgery;  Laterality: N/A;   ICD IMPLANT  2012   PROSTATECTOMY     RIGHT/LEFT HEART CATH AND CORONARY ANGIOGRAPHY N/A 02/12/2023   Procedure: RIGHT/LEFT HEART CATH AND CORONARY ANGIOGRAPHY;  Surgeon: Kathleene Hazel, MD;  Location: [facility] INVASIVE CV LAB;  Service: Cardiovascular;  Laterality: N/A;   TEE WITHOUT CARDIOVERSION N/A 02/26/2023   Procedure: TRANSESOPHAGEAL ECHOCARDIOGRAM;  Surgeon: Corliss Skains, MD;  Location: [facility] OR;  Service: Open Heart Surgery;  Laterality: N/A;   Family History  Problem Relation Age of Onset   Lung cancer Mother    Uterine cancer Mother    Lung cancer Father    Atrial fibrillation Sister    Lung cancer Brother    Colon cancer Neg Hx    Esophageal cancer Neg Hx    Pancreatic cancer Neg Hx    Liver disease Neg Hx    Stomach cancer Neg Hx    Rectal cancer Neg Hx    Social History   Socioeconomic History   Marital status: Married    Spouse name: Not on file   Number of children: 7   Years of education: Not on file   Highest education level: Not on file  Occupational History   Occupation: Print production planner   Tobacco Use   Smoking status: Former    Current packs/day: 0.00    Types: Cigarettes    Quit date: 09/12/1983    Years since quitting: 39.9    Passive exposure: Never   Smokeless tobacco: Never  Vaping Use   Vaping status: Never Used  Substance and Sexual Activity   Alcohol use: Yes    Comment: very rare   Drug use: No   Sexual activity: Not Currently  Other Topics Concern   Not on file  Social History Narrative   Not on file   Social Determinants of Health   Financial Resource Strain: Low Risk  (08/07/2023)   Overall Financial Resource Strain (CARDIA)    Difficulty of Paying Living Expenses: Not hard at all  Food  Insecurity: No Food Insecurity (08/07/2023)   Hunger Vital Sign    Worried About Running Out of Food in the Last Year: Never true    Ran Out of Food in the Last Year: Never true  Transportation Needs: No Transportation Needs (08/07/2023)   PRAPARE - Administrator, Civil Service (Medical): No    Lack of Transportation (Non-Medical): No  Physical Activity: Insufficiently Active (08/07/2023)   Exercise Vital Sign    Days of Exercise per Week: 3 days    Minutes of Exercise per Session: 40 min  Stress: No Stress Concern Present (08/07/2023)   [employer] of Occupational Health - Occupational Stress Questionnaire    Feeling of  Stress : Not at all  Social Connections: Unknown (08/07/2023)   Social Connection and Isolation Panel [NHANES]    Frequency of Communication with Friends and Family: More than three times a week    Frequency of Social Gatherings with Friends and Family: More than three times a week    Attends Religious Services: Not on Marketing executive or Organizations: Yes    Attends Banker Meetings: 1 to 4 times per year    Marital Status: Married    Tobacco Counseling Counseling given: Not Answered   Clinical Intake:  Pre-visit preparation completed: Yes  Pain : No/denies pain     BMI - recorded: 31.87 Nutritional Status: BMI > 30  Obese Nutritional Risks: None Diabetes: Yes CBG done?: No Did pt. bring in CBG monitor from home?: No  How often do you need to have someone help you when you read instructions, pamphlets, or other written materials from your doctor or pharmacy?: 1 - Never  Interpreter Needed?: No  Information entered by :: Theresa Mulligan LPN   Activities of Daily Living    08/07/2023    9:48 AM 03/29/2023   12:17 PM  In your present state of health, do you have any difficulty performing the following activities:  Hearing? 0 0  Vision? 0 0  Difficulty concentrating or making decisions? 0 0  Walking  or climbing stairs? 0 0  Dressing or bathing? 0 0  Doing errands, shopping? 0   Preparing Food and eating ? N   Using the Toilet? N   In the past six months, have you accidently leaked urine? Y   Comment Incontient of urine, followed by a Urologist and PCP   Do you have problems with loss of bowel control? N   Managing your Medications? N   Managing your Finances? N   Housekeeping or managing your Housekeeping? N     Patient Care Team: Copland, Gwenlyn Found, MD as PCP - General (Family Medicine) Regan Lemming, MD as PCP - Electrophysiology (Cardiology) Jens Som Madolyn Frieze, MD as PCP - Cardiology (Cardiology)  Indicate any recent Medical Services you may have received from other than [facility] providers in the past year (date may be approximate).     Assessment:   This is a routine wellness examination for [place].  Hearing/Vision screen Hearing Screening - Comments:: Denies hearing difficulties   Vision Screening - Comments:: Wears reading glasses - Not up to date with routine eye exams Referred to [facility] [place]    Goals Addressed               This Visit's Progress     Increase physical activity (pt-stated)         Depression Screen    08/14/2023    1:27 PM 11/13/2022    1:19 PM 08/01/2022   11:38 AM 07/13/2021    3:46 PM 01/27/2021    3:26 PM 01/07/2018    1:30 PM  PHQ 2/9 Scores  PHQ - 2 Score 0 0 0 0 0 0    Fall Risk    08/14/2023    1:22 PM 08/07/2023    9:48 AM 11/13/2022    1:19 PM 08/01/2022   11:37 AM 07/13/2021    3:45 PM  Fall Risk   Falls in the past year? 0 0 0 0 0  Number falls in past yr: 0 0 0 0 0  Injury with Fall? 0 0 0 0 0  Risk for fall due to : No Fall Risks  No Fall Risks No Fall Risks   Follow up Falls prevention discussed  Falls evaluation completed Falls evaluation completed Falls prevention discussed    MEDICARE RISK AT HOME: Medicare Risk at Home Any stairs in or around the home?: Yes If so, are there any without  handrails?: No Home free of loose throw rugs in walkways, pet beds, electrical cords, etc?: Yes Adequate lighting in your home to reduce risk of falls?: Yes Life alert?: No Use of a cane, walker or w/c?: No Grab bars in the bathroom?: No Shower chair or bench in shower?: No Elevated toilet seat or a handicapped toilet?: No  TIMED UP AND GO:  Was the test performed?  No    Cognitive Function:        08/14/2023    1:23 PM  6CIT Screen  What Year? 0 points  What month? 0 points  What time? 0 points  Count back from 20 0 points  Months in reverse 0 points  Repeat phrase 0 points  Total Score 0 points    Immunizations Immunization History  Administered Date(s) Administered   Fluad Quad(high Dose 65+) 06/10/2019, 09/08/2020   Influenza,inj,Quad PF,6+ Mos 10/11/2016   Influenza-Unspecified 08/11/2021, 05/12/2022   PFIZER Comirnaty(Gray Top)Covid-19 Tri-Sucrose Vaccine 02/10/2021   PFIZER(Purple Top)SARS-COV-2 Vaccination 10/25/2019, 11/19/2019, 06/08/2020, 08/11/2021   Pfizer Covid-19 Vaccine Bivalent Booster 48yrs & up 08/11/2021   Pneumococcal Conjugate-13 08/06/2019   Pneumococcal Polysaccharide-23 10/27/2014   Tdap 01/11/2006, 09/08/2020   Zoster Recombinant(Shingrix) 08/11/2021, 10/14/2021   Zoster, Live 10/27/2014    TDAP status: Up to date  Flu Vaccine status: Due, Education has been provided regarding the importance of this vaccine. Advised may receive this vaccine at local pharmacy or Health Dept. Aware to provide a copy of the vaccination record if obtained from local pharmacy or Health Dept. Verbalized acceptance and understanding.  Pneumococcal vaccine status: Up to date  Covid-19 vaccine status: Declined, Education has been provided regarding the importance of this vaccine but patient still declined. Advised may receive this vaccine at local pharmacy or Health Dept.or vaccine clinic. Aware to provide a copy of the vaccination record if obtained from local  pharmacy or Health Dept. Verbalized acceptance and understanding.  Qualifies for Shingles Vaccine? Yes   Zostavax completed Yes   Shingrix Completed?: Yes  Screening Tests Health Maintenance  Topic Date Due   OPHTHALMOLOGY EXAM  Never done   INFLUENZA VACCINE  04/12/2023   COVID-19 Vaccine (6 - 2023-24 season) 05/13/2023   HEMOGLOBIN A1C  08/24/2023   Diabetic kidney evaluation - Urine ACR  11/13/2023   FOOT EXAM  11/13/2023   Diabetic kidney evaluation - eGFR measurement  06/14/2024   Medicare Annual Wellness (AWV)  08/13/2024   Colonoscopy  10/24/2025   DTaP/Tdap/Td (3 - Td or Tdap) 09/08/2030   Pneumonia Vaccine 41+ Years old  Completed   Hepatitis C Screening  Completed   Zoster Vaccines- Shingrix  Completed   HPV VACCINES  Aged Out    Health Maintenance  Health Maintenance Due  Topic Date Due   OPHTHALMOLOGY EXAM  Never done   INFLUENZA VACCINE  04/12/2023   COVID-19 Vaccine (6 - 2023-24 season) 05/13/2023    Colorectal cancer screening: Type of screening: Colonoscopy. Completed 10/24/22. Repeat every 3 years     Additional Screening:  Hepatitis C Screening: does qualify; Completed 08/06/19  Vision Screening: Recommended annual ophthalmology exams for early detection of glaucoma and other disorders of  the eye. Is the patient up to date with their annual eye exam?  No  Who is the provider or what is the name of the office in which the patient attends annual eye exams? Patient Deferred If pt is not established with a provider, would they like to be referred to a provider to establish care? Yes .   Dental Screening: Recommended annual dental exams for proper oral hygiene  Diabetic Foot Exam: Diabetic Foot Exam: Completed 11/13/22  Community Resource Referral / Chronic Care Management:  CRR required this visit?  No   CCM required this visit?  No     Plan:     I have personally reviewed and noted the following in the patient's chart:   Medical and social  history Use of alcohol, tobacco or illicit drugs  Current medications and supplements including opioid prescriptions. Patient is not currently taking opioid prescriptions. Functional ability and status Nutritional status Physical activity Advanced directives List of other physicians Hospitalizations, surgeries, and ER visits in previous 12 months Vitals Screenings to include cognitive, depression, and falls Referrals and appointments  In addition, I have reviewed and discussed with patient certain preventive protocols, quality metrics, and best practice recommendations. A written personalized care plan for preventive services as well as general preventive health recommendations were provided to patient.     Tillie Rung, LPN   16/09/958   After Visit Summary: (MyChart) Due to this being a telephonic visit, the after visit summary with patients personalized plan was offered to patient via MyChart   Nurse Notes: None

## 2023-08-14 NOTE — Patient Instructions (Addendum)
Bobby Bradley , Thank you for taking time to come for your Medicare Wellness Visit. I appreciate your ongoing commitment to your health goals. Please review the following plan we discussed and let me know if I can assist you in the future.   Referrals/Orders/Follow-Ups/Clinician Recommendations:   This is a list of the screening recommended for you and due dates:  Health Maintenance  Topic Date Due   Eye exam for diabetics  Never done   Flu Shot  04/12/2023   COVID-19 Vaccine (6 - 2023-24 season) 05/13/2023   Hemoglobin A1C  08/24/2023   Yearly kidney health urinalysis for diabetes  11/13/2023   Complete foot exam   11/13/2023   Yearly kidney function blood test for diabetes  06/14/2024   Medicare Annual Wellness Visit  08/13/2024   Colon Cancer Screening  10/24/2025   DTaP/Tdap/Td vaccine (3 - Td or Tdap) 09/08/2030   Pneumonia Vaccine  Completed   Hepatitis C Screening  Completed   Zoster (Shingles) Vaccine  Completed   HPV Vaccine  Aged Out    Advanced directives: (Declined) Advance directive discussed with you today. Even though you declined this today, please call our office should you change your mind, and we can give you the proper paperwork for you to fill out.  Next Medicare Annual Wellness Visit scheduled for next year: Yes

## 2023-08-14 NOTE — Progress Notes (Signed)
I have reviewed and agree with [facility] documentation.  Kathlene November, MD

## 2023-08-15 DIAGNOSIS — Z951 Presence of aortocoronary bypass graft: Secondary | ICD-10-CM | POA: Diagnosis not present

## 2023-08-16 DIAGNOSIS — Z951 Presence of aortocoronary bypass graft: Secondary | ICD-10-CM | POA: Diagnosis not present

## 2023-08-17 DIAGNOSIS — E119 Type 2 diabetes mellitus without complications: Secondary | ICD-10-CM | POA: Diagnosis not present

## 2023-08-17 DIAGNOSIS — H25813 Combined forms of age-related cataract, bilateral: Secondary | ICD-10-CM | POA: Diagnosis not present

## 2023-08-17 DIAGNOSIS — H3540 Unspecified peripheral retinal degeneration: Secondary | ICD-10-CM | POA: Diagnosis not present

## 2023-08-17 LAB — HM DIABETES EYE EXAM

## 2023-08-20 DIAGNOSIS — Z951 Presence of aortocoronary bypass graft: Secondary | ICD-10-CM | POA: Diagnosis not present

## 2023-08-22 ENCOUNTER — Telehealth: Payer: Self-pay | Admitting: Gastroenterology

## 2023-08-22 DIAGNOSIS — D509 Iron deficiency anemia, unspecified: Secondary | ICD-10-CM

## 2023-08-22 DIAGNOSIS — Z951 Presence of aortocoronary bypass graft: Secondary | ICD-10-CM | POA: Diagnosis not present

## 2023-08-22 NOTE — Addendum Note (Signed)
Addended by: Richardson Chiquito on: 08/22/2023 02:27 PM   Modules accepted: Orders

## 2023-08-22 NOTE — Telephone Encounter (Signed)
Dottie can you help relay the following to this patient:  Capsule endoscopy done, formal report to be scanned into epic.  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  I think the AVMs are likely causing his iron deficiency in the setting of Eliquis.  As long as his hemoglobin stays stable on iron I do not think we need to pursue ablation of these.  These are benign and have no cancerous potential, reassuring.  He should continue oral iron for now, minimize his NSAID use other than aspirin, repeat CBC and TIBC ferritin panel in May timeframe.  Thanks

## 2023-08-22 NOTE — Telephone Encounter (Signed)
Spoke to patient and advised of results/recommendations as per Dr Adela Lank. Patient verbalizes understanding. Lab orders entered for May 2025. Will remind patient to have labs when it gets closer to that time.

## 2023-08-23 DIAGNOSIS — Z951 Presence of aortocoronary bypass graft: Secondary | ICD-10-CM | POA: Diagnosis not present

## 2023-08-24 ENCOUNTER — Encounter: Payer: Self-pay | Admitting: Gastroenterology

## 2023-08-27 DIAGNOSIS — Z951 Presence of aortocoronary bypass graft: Secondary | ICD-10-CM | POA: Diagnosis not present

## 2023-08-29 DIAGNOSIS — Z951 Presence of aortocoronary bypass graft: Secondary | ICD-10-CM | POA: Diagnosis not present

## 2023-09-10 ENCOUNTER — Ambulatory Visit (INDEPENDENT_AMBULATORY_CARE_PROVIDER_SITE_OTHER): Payer: Medicare Other

## 2023-09-10 DIAGNOSIS — I428 Other cardiomyopathies: Secondary | ICD-10-CM

## 2023-09-10 LAB — CUP PACEART REMOTE DEVICE CHECK
Battery Remaining Longevity: 90 mo
Battery Remaining Percentage: 83 %
Brady Statistic RV Percent Paced: 23 %
Date Time Interrogation Session: 20241230040200
HighPow Impedance: 45 Ohm
Implantable Lead Connection Status: 753985
Implantable Lead Implant Date: 20160308
Implantable Lead Location: 753860
Implantable Lead Model: 295
Implantable Lead Serial Number: 363165
Implantable Pulse Generator Implant Date: 20160308
Lead Channel Impedance Value: 369 Ohm
Lead Channel Pacing Threshold Amplitude: 0.6 V
Lead Channel Pacing Threshold Pulse Width: 0.5 ms
Lead Channel Setting Pacing Amplitude: 2.4 V
Lead Channel Setting Pacing Pulse Width: 0.5 ms
Lead Channel Setting Sensing Sensitivity: 0.6 mV
Pulse Gen Serial Number: 200269

## 2023-09-22 ENCOUNTER — Other Ambulatory Visit: Payer: Self-pay | Admitting: Family Medicine

## 2023-09-22 DIAGNOSIS — E119 Type 2 diabetes mellitus without complications: Secondary | ICD-10-CM

## 2023-10-01 NOTE — Patient Instructions (Incomplete)
Good to see you today!  

## 2023-10-01 NOTE — Progress Notes (Addendum)
[facility] [address], [address] [place], [place] [address] [phone]  Date:  10/04/2023   Name:  Bobby Bradley   DOB:  04/07/1946   MRN:  [id number]  PCP:  Pearline Cables, MD    Chief Complaint: possible UTI (Pt c/o: low grade fever, non productive cough x a couple of weeks. Pt had a CABG a few weeks ago. Card wanted to rule out a uti. He also noticed some penal discharge late last week. Cough is still come and go./Flu: none/A1C due)   History of Present Illness:  Bobby Bradley is a 78 y.o. very pleasant male patient who presents with the following:  Pt seen today with concern of UTI/blood in urine Last seen by myself 3/24 History of well-controlled diabetes, cardiomyopathy with ICD, TIA, A. fib on anticoagulation Eliquis, CHF, ICD in place, hypertension, prostate cancer s/p prostatectomy    Cardiology care through Drs Jens Som and [facility]  Pt notes last week he noted reddish discharge on his urinary pad- appeared to be blood No pain with urination, he is not seeing blood in the toilet bowl, no other abnormal bleeding No abd pain or back pain  Bloody discharge/urine on pad present for 2 days and then stopped He did see some blood in his urine a few months ago-around the time of his CABG procedure.  Patient reports he was told he had a urinary tract infection was given antibiotics.  However I do not believe a urine culture was complete  He has noted a cough for about 2 weeks, feeling a bit achy but not like he has the flu He felt slightly feverish but did not check his temp   He is a former smoker but quit in his 30s No history of kidney stone   He does not have a urologist at this time  - his prostate cancer was treated 20 years ago when he lived in [place] (prostatectomy), he no longer is in contact with urology - we just do a PSA annually at this point  Lab Results  Component Value Date   PSA 0.06 (L) 10/04/2023    PSA 0.00 Repeated and verified X2. (L) 11/13/2022   PSA 0.02 (L) 10/26/2021       Patient Active Problem List   Diagnosis Date Noted   S/P CABG x 4 02/26/2023   Bronchitis 08/17/2018   Controlled type 2 diabetes mellitus without complication, without long-term current use of insulin (HCC) 02/06/2018   Cardiomyopathy (HCC) 04/16/2017   History of TIA (transient ischemic attack) 04/16/2017   Chronic anticoagulation 04/16/2017   Paroxysmal atrial fibrillation (HCC) 10/11/2016   Chronic systolic congestive heart failure (HCC) 10/11/2016   ICD (implantable cardioverter-defibrillator) in place 10/11/2016   History of prostate cancer 10/11/2016   Essential hypertension 10/11/2016    Past Medical History:  Diagnosis Date   A-fib [facility])    AICD (automatic cardioverter/defibrillator) present    CHF (congestive heart failure) (HCC)    Colon polyps    Diabetes mellitus without complication (HCC)    Dysrhythmia    Essential hypertension    Hyperlipidemia    Non-ischemic cardiomyopathy (HCC)    Presence of combination internal cardiac defibrillator (ICD) and pacemaker    Presence of permanent cardiac pacemaker    Prostate cancer (HCC)    Stroke (HCC)    TIA- 1994   TIA (transient ischemic attack)     Past Surgical History:  Procedure Laterality Date  BACK SURGERY     CARDIAC CATHETERIZATION     CARDIOVERSION N/A 03/29/2023   Procedure: CARDIOVERSION;  Surgeon: Lewayne Bunting, MD;  Location: [facility] INVASIVE CV LAB;  Service: Cardiovascular;  Laterality: N/A;   CLIPPING OF ATRIAL APPENDAGE N/A 02/26/2023   Procedure: CLIPPING OF LEFT ATRIAL APPENDAGE;  Surgeon: Corliss Skains, MD;  [facility]: [facility] OR;  Service: Open Heart Surgery;  Laterality: N/A;   COLONOSCOPY     about 10 years. [place]    CORONARY ARTERY BYPASS GRAFT N/A 02/26/2023   Procedure: CORONARY ARTERY BYPASS GRAFTING (CABG) X 4 WITH LEFT INTERNAL MAMMARY ARTERY HARVEST AND RIGHT  GREATER SAPHENOUS VEIN HARVESTED ENDOSCOPICALLY;  Surgeon: Corliss Skains, MD;  Location: [facility] OR;  Service: Open Heart Surgery;  Laterality: N/A;   ICD IMPLANT  2012   PROSTATECTOMY     RIGHT/LEFT HEART CATH AND CORONARY ANGIOGRAPHY N/A 02/12/2023   Procedure: RIGHT/LEFT HEART CATH AND CORONARY ANGIOGRAPHY;  Surgeon: Kathleene Hazel, MD;  [facility]: [facility] INVASIVE CV LAB;  Service: Cardiovascular;  Laterality: N/A;   TEE WITHOUT CARDIOVERSION N/A 02/26/2023   Procedure: TRANSESOPHAGEAL ECHOCARDIOGRAM;  Surgeon: Corliss Skains, MD;  Location: [facility] OR;  Service: Open Heart Surgery;  Laterality: N/A;    Social History   Tobacco Use   Smoking status: Former    Current packs/day: 0.00    Types: Cigarettes    Quit date: 09/12/1983    Years since quitting: 40.0    Passive exposure: Never   Smokeless tobacco: Never  Vaping Use   Vaping status: Never Used  Substance Use Topics   Alcohol use: Yes    Comment: very rare   Drug use: No    Family History  Problem Relation Age of Onset   Lung cancer Mother    Uterine cancer Mother    Lung cancer Father    Atrial fibrillation Sister    Lung cancer Brother    Colon cancer Neg Hx    Esophageal cancer Neg Hx    Pancreatic cancer Neg Hx    Liver disease Neg Hx    Stomach cancer Neg Hx    Rectal cancer Neg Hx     Allergies  Allergen Reactions   Lisinopril     Extreme diarrhea    Medication list has been reviewed and updated.  Current Outpatient Medications on File Prior to Visit  Medication Sig Dispense Refill   amiodarone (PACERONE) 200 MG tablet Take 1 tablet daily 90 tablet 3   apixaban (ELIQUIS) 5 MG TABS tablet TAKE 1 TABLET(5 MG) BY MOUTH TWICE DAILY 180 tablet 3   aspirin EC 81 MG tablet Take 81 mg by mouth daily. Swallow whole.     carvedilol (COREG) 25 MG tablet TAKE 1 TABLET(25 MG) BY MOUTH TWICE DAILY 180 tablet 3   cetirizine (ZYRTEC) 10 MG tablet Take 10 mg by mouth daily.     ferrous sulfate 325 (65 FE) MG  tablet Take 1 tablet (325 mg total) by mouth daily with breakfast. 90 tablet 3   metFORMIN (GLUCOPHAGE) 500 MG tablet TAKE 1 TABLET(500 MG) BY MOUTH TWICE DAILY WITH A MEAL 180 tablet 3   rosuvastatin (CRESTOR) 40 MG tablet Take 1 tablet (40 mg total) by mouth daily. 90 tablet 3   sacubitril-valsartan (ENTRESTO) 24-26 MG Take 1 tablet by mouth 2 (two) times daily. 60 tablet 11   tetrahydrozoline 0.05 % ophthalmic solution Place 1 drop into both eyes every morning.  No current facility-administered medications on file prior to visit.    Review of Systems:  As per HPI- otherwise negative.  BP Readings from Last 3 Encounters:  10/04/23 (!) 142/78  08/01/23 134/68  04/09/23 112/66   Recheck blood pressure, about the same.  His blood pressure has been normal recently. Patient notes no penile injuries or discharge, no testicular changes   Physical Examination: Vitals:   10/04/23 0904  BP: (!) 142/78  Pulse: 60  Resp: 18  Temp: 98.2 F (36.8 C)  SpO2: 96%   Vitals:   10/04/23 0904  Weight: 237 lb (107.5 kg)  Height: 6' (1.829 m)   Body mass index is 32.14 kg/m. Ideal Body Weight: Weight in (lb) to have BMI = 25: 183.9  GEN: no acute distress.  Central obesity, looks well HEENT: Atraumatic, Normocephalic.  Ears and Nose: No external deformity. CV: RRR, No M/G/R. No JVD. No thrill. No extra heart sounds. PULM: CTA B, no wheezes, crackles, rhonchi. No retractions. No resp. distress. No accessory muscle use. ABD: S, NT, ND, +BS. No rebound. No HSM.  Belly is benign EXTR: No c/c/e PSYCH: Normally interactive. Conversant.   Results for orders placed or performed in visit on 10/04/23  POCT urinalysis dipstick   Collection Time: 10/04/23  9:35 AM  Result Value Ref Range   Color, UA yellow yellow   Clarity, UA cloudy (A) clear   Glucose, UA negative negative mg/dL   Bilirubin, UA negative negative   Ketones, POC UA negative negative mg/dL   Spec Grav, UA 1.610 9.604 -  1.025   Blood, UA large (A) negative   pH, UA 5.0 5.0 - 8.0   Protein Ur, POC trace (A) negative mg/dL   Urobilinogen, UA 0.2 0.2 or 1.0 E.U./dL   Nitrite, UA Negative Negative   Leukocytes, UA Negative Negative  Urine Culture   Collection Time: 10/04/23  9:38 AM   Specimen: Urine  Result Value Ref Range   MICRO NUMBER: [id number]    SPECIMEN QUALITY: Adequate    Sample Source URINE    STATUS: FINAL    Result: No Growth   PSA   Collection Time: 10/04/23  9:38 AM  Result Value Ref Range   PSA 0.06 (L) 0.10 - 4.00 ng/mL  Urine Microscopic Only   Collection Time: 10/04/23  9:38 AM  Result Value Ref Range   WBC, UA 0-2/hpf 0-2/hpf   RBC / HPF 3-6/hpf (A) 0-2/hpf   Mucus, UA Presence of (A) None   Squamous Epithelial / HPF Rare(0-4/hpf) Rare(0-4/hpf)   Granular Casts, UA Presence of (A) None   Hyaline Casts, UA Presence of (A) None   Ca Oxalate Crys, UA Presence of (A) None   Amorphous Present (A) None;Present  Basic metabolic panel   Collection Time: 10/04/23  9:38 AM  Result Value Ref Range   Sodium 143 135 - 145 mEq/L   Potassium 4.2 3.5 - 5.1 mEq/L   Chloride 106 96 - 112 mEq/L   CO2 26 19 - 32 mEq/L   Glucose, Bld 114 (H) 70 - 99 mg/dL   BUN 16 6 - 23 mg/dL   Creatinine, Ser 1.47 0.40 - 1.50 mg/dL   GFR 82.95 (L) >62.13 mL/min   Calcium 9.2 8.4 - 10.5 mg/dL  CBC   Collection Time: 10/04/23  9:38 AM  Result Value Ref Range   WBC 6.6 4.0 - 10.5 K/uL   RBC 4.99 4.22 - 5.81 Mil/uL   Platelets 201.0 150.0 - 400.0 K/uL  Hemoglobin 14.8 13.0 - 17.0 g/dL   HCT 16.1 09.6 - 04.5 %   MCV 90.3 78.0 - 100.0 fl   MCHC 32.9 30.0 - 36.0 g/dL   RDW 40.9 81.1 - 91.4 %  Hemoglobin A1c   Collection Time: 10/04/23  9:38 AM  Result Value Ref Range   Hgb A1c MFr Bld 6.4 4.6 - 6.5 %    Assessment and Plan: Gross hematuria - Plan: Urine Culture, Urine Microscopic Only, Basic metabolic panel, CBC, POCT urinalysis dipstick  Subacute cough - Plan: DG Chest 2 View  History of  prostate cancer - Plan: PSA  Controlled type 2 diabetes mellitus without complication, without long-term current use of insulin (HCC) - Plan: Basic metabolic panel, Hemoglobin A1c  Patient seen today with mild gross hematuria.  It sounds like this was also present about 6 months ago, was thought to be an infection although no culture at that time that I can see.  Advised at this point we will obtain a urine culture to determine if he does have a urinary tract infection.  If he does certainly we will treat it.  If not we will begin further workup for gross hematuria with renal stone study and referral to urology for cystoscopy.  Patient is a former smoker  He also has noted a cough for couple of weeks, does not seem severe but as it has been persistent we will obtain a chest x-ray  Will also follow-up on diabetes today  Signed Abbe Amsterdam, MD  Received chest film as below, message to patient  No results found.  Addendum 1/23, received labs as below.  Message to patient  Addendum 1/25, received urine culture, it is negative.  Message to patient, referral to urology  Results for orders placed or performed in visit on 10/04/23  POCT urinalysis dipstick   Collection Time: 10/04/23  9:35 AM  Result Value Ref Range   Color, UA yellow yellow   Clarity, UA cloudy (A) clear   Glucose, UA negative negative mg/dL   Bilirubin, UA negative negative   Ketones, POC UA negative negative mg/dL   Spec Grav, UA 7.829 5.621 - 1.025   Blood, UA large (A) negative   pH, UA 5.0 5.0 - 8.0   Protein Ur, POC trace (A) negative mg/dL   Urobilinogen, UA 0.2 0.2 or 1.0 E.U./dL   Nitrite, UA Negative Negative   Leukocytes, UA Negative Negative  Urine Culture   Collection Time: 10/04/23  9:38 AM   Specimen: Urine  Result Value Ref Range   MICRO NUMBER: [id number]    SPECIMEN QUALITY: Adequate    Sample Source URINE    STATUS: FINAL    Result: No Growth   PSA   Collection Time: 10/04/23  9:38 AM   Result Value Ref Range   PSA 0.06 (L) 0.10 - 4.00 ng/mL  Urine Microscopic Only   Collection Time: 10/04/23  9:38 AM  Result Value Ref Range   WBC, UA 0-2/hpf 0-2/hpf   RBC / HPF 3-6/hpf (A) 0-2/hpf   Mucus, UA Presence of (A) None   Squamous Epithelial / HPF Rare(0-4/hpf) Rare(0-4/hpf)   Granular Casts, UA Presence of (A) None   Hyaline Casts, UA Presence of (A) None   Ca Oxalate Crys, UA Presence of (A) None   Amorphous Present (A) None;Present  Basic metabolic panel   Collection Time: 10/04/23  9:38 AM  Result Value Ref Range   Sodium 143 135 - 145 mEq/L   Potassium 4.2 3.5 -  5.1 mEq/L   Chloride 106 96 - 112 mEq/L   CO2 26 19 - 32 mEq/L   Glucose, Bld 114 (H) 70 - 99 mg/dL   BUN 16 6 - 23 mg/dL   Creatinine, Ser 1.61 0.40 - 1.50 mg/dL   GFR 09.60 (L) >45.40 mL/min   Calcium 9.2 8.4 - 10.5 mg/dL  CBC   Collection Time: 10/04/23  9:38 AM  Result Value Ref Range   WBC 6.6 4.0 - 10.5 K/uL   RBC 4.99 4.22 - 5.81 Mil/uL   Platelets 201.0 150.0 - 400.0 K/uL   Hemoglobin 14.8 13.0 - 17.0 g/dL   HCT 98.1 19.1 - 47.8 %   MCV 90.3 78.0 - 100.0 fl   MCHC 32.9 30.0 - 36.0 g/dL   RDW 29.5 62.1 - 30.8 %  Hemoglobin A1c   Collection Time: 10/04/23  9:38 AM  Result Value Ref Range   Hgb A1c MFr Bld 6.4 4.6 - 6.5 %

## 2023-10-04 ENCOUNTER — Encounter: Payer: Self-pay | Admitting: Family Medicine

## 2023-10-04 ENCOUNTER — Ambulatory Visit (HOSPITAL_BASED_OUTPATIENT_CLINIC_OR_DEPARTMENT_OTHER)
Admission: RE | Admit: 2023-10-04 | Discharge: 2023-10-04 | Disposition: A | Discharge status: Home / Self Care | Payer: Medicare Other | Source: Ambulatory Visit | Attending: Family Medicine | Admitting: Family Medicine

## 2023-10-04 ENCOUNTER — Ambulatory Visit: Payer: Medicare Other | Admitting: Family Medicine

## 2023-10-04 VITALS — BP 142/78 | HR 60 | Temp 98.2°F | Resp 18 | Ht 72.0 in | Wt 237.0 lb

## 2023-10-04 DIAGNOSIS — R059 Cough, unspecified: Secondary | ICD-10-CM | POA: Diagnosis not present

## 2023-10-04 DIAGNOSIS — R052 Subacute cough: Secondary | ICD-10-CM | POA: Diagnosis not present

## 2023-10-04 DIAGNOSIS — Z8546 Personal history of malignant neoplasm of prostate: Secondary | ICD-10-CM | POA: Diagnosis not present

## 2023-10-04 DIAGNOSIS — R310 Gross hematuria: Secondary | ICD-10-CM | POA: Diagnosis not present

## 2023-10-04 DIAGNOSIS — E119 Type 2 diabetes mellitus without complications: Secondary | ICD-10-CM

## 2023-10-04 DIAGNOSIS — Z95810 Presence of automatic (implantable) cardiac defibrillator: Secondary | ICD-10-CM | POA: Diagnosis not present

## 2023-10-04 DIAGNOSIS — R0989 Other specified symptoms and signs involving the circulatory and respiratory systems: Secondary | ICD-10-CM | POA: Diagnosis not present

## 2023-10-04 DIAGNOSIS — I517 Cardiomegaly: Secondary | ICD-10-CM | POA: Diagnosis not present

## 2023-10-04 LAB — POCT URINALYSIS DIP (MANUAL ENTRY)
Bilirubin, UA: NEGATIVE
Glucose, UA: NEGATIVE mg/dL
Ketones, POC UA: NEGATIVE mg/dL
Leukocytes, UA: NEGATIVE
Nitrite, UA: NEGATIVE
Spec Grav, UA: 1.02 (ref 1.010–1.025)
Urobilinogen, UA: 0.2 U/dL
pH, UA: 5 (ref 5.0–8.0)

## 2023-10-04 LAB — CBC
HCT: 45.1 % (ref 39.0–52.0)
Hemoglobin: 14.8 g/dL (ref 13.0–17.0)
MCHC: 32.9 g/dL (ref 30.0–36.0)
MCV: 90.3 fL (ref 78.0–100.0)
Platelets: 201 10*3/uL (ref 150.0–400.0)
RBC: 4.99 Mil/uL (ref 4.22–5.81)
RDW: 14.1 % (ref 11.5–15.5)
WBC: 6.6 10*3/uL (ref 4.0–10.5)

## 2023-10-04 LAB — BASIC METABOLIC PANEL
BUN: 16 mg/dL (ref 6–23)
CO2: 26 meq/L (ref 19–32)
Calcium: 9.2 mg/dL (ref 8.4–10.5)
Chloride: 106 meq/L (ref 96–112)
Creatinine, Ser: 1.19 mg/dL (ref 0.40–1.50)
GFR: 59.43 mL/min — ABNORMAL LOW (ref >60.00)
Glucose, Bld: 114 mg/dL — ABNORMAL HIGH (ref 70–99)
Potassium: 4.2 meq/L (ref 3.5–5.1)
Sodium: 143 meq/L (ref 135–145)

## 2023-10-04 LAB — URINALYSIS, MICROSCOPIC ONLY

## 2023-10-04 LAB — HEMOGLOBIN A1C: Hgb A1c MFr Bld: 6.4 % (ref 4.6–6.5)

## 2023-10-04 LAB — PSA: PSA: 0.06 ng/mL — ABNORMAL LOW (ref 0.10–4.00)

## 2023-10-05 LAB — URINE CULTURE
MICRO NUMBER:: 15989076
Result:: NO GROWTH
SPECIMEN QUALITY:: ADEQUATE

## 2023-10-06 ENCOUNTER — Encounter: Payer: Self-pay | Admitting: Family Medicine

## 2023-10-06 DIAGNOSIS — R310 Gross hematuria: Secondary | ICD-10-CM

## 2023-10-06 NOTE — Addendum Note (Signed)
Addended by: Abbe Amsterdam C on: 10/06/2023 11:45 AM   Modules accepted: Orders

## 2023-10-09 ENCOUNTER — Telehealth: Payer: Self-pay | Admitting: Family Medicine

## 2023-10-09 NOTE — Telephone Encounter (Signed)
Copied from CRM [phone]. Topic: General - Other >> Oct 09, 2023  3:47 PM Hector Shade B wrote: Reason for CRM: Merry Proud from [facility] Pre Auth. Services [phone] ext [phone] to return her call in regarding a Outpatient Radiology Auth

## 2023-10-10 ENCOUNTER — Ambulatory Visit (HOSPITAL_BASED_OUTPATIENT_CLINIC_OR_DEPARTMENT_OTHER)
Admission: RE | Admit: 2023-10-10 | Discharge: 2023-10-10 | Disposition: A | Discharge status: Home / Self Care | Payer: Medicare Other | Source: Ambulatory Visit | Attending: Family Medicine | Admitting: Family Medicine

## 2023-10-10 DIAGNOSIS — N281 Cyst of kidney, acquired: Secondary | ICD-10-CM | POA: Diagnosis not present

## 2023-10-10 DIAGNOSIS — R310 Gross hematuria: Secondary | ICD-10-CM | POA: Diagnosis not present

## 2023-10-10 DIAGNOSIS — K8020 Calculus of gallbladder without cholecystitis without obstruction: Secondary | ICD-10-CM | POA: Diagnosis not present

## 2023-10-10 DIAGNOSIS — K5730 Diverticulosis of large intestine without perforation or abscess without bleeding: Secondary | ICD-10-CM | POA: Diagnosis not present

## 2023-10-10 DIAGNOSIS — N200 Calculus of kidney: Secondary | ICD-10-CM | POA: Diagnosis not present

## 2023-10-12 ENCOUNTER — Ambulatory Visit (INDEPENDENT_AMBULATORY_CARE_PROVIDER_SITE_OTHER): Payer: Medicare Other | Admitting: Urology

## 2023-10-12 ENCOUNTER — Encounter: Payer: Self-pay | Admitting: Urology

## 2023-10-12 ENCOUNTER — Ambulatory Visit (HOSPITAL_BASED_OUTPATIENT_CLINIC_OR_DEPARTMENT_OTHER)
Admission: RE | Admit: 2023-10-12 | Discharge: 2023-10-12 | Disposition: A | Discharge status: Home / Self Care | Payer: Medicare Other | Source: Ambulatory Visit | Attending: Urology | Admitting: Urology

## 2023-10-12 VITALS — BP 136/68 | HR 62 | Ht 72.0 in | Wt 237.0 lb

## 2023-10-12 DIAGNOSIS — N200 Calculus of kidney: Secondary | ICD-10-CM | POA: Diagnosis not present

## 2023-10-12 DIAGNOSIS — N281 Cyst of kidney, acquired: Secondary | ICD-10-CM | POA: Diagnosis not present

## 2023-10-12 DIAGNOSIS — R310 Gross hematuria: Secondary | ICD-10-CM | POA: Insufficient documentation

## 2023-10-12 DIAGNOSIS — N3945 Continuous leakage: Secondary | ICD-10-CM | POA: Insufficient documentation

## 2023-10-12 DIAGNOSIS — Z8546 Personal history of malignant neoplasm of prostate: Secondary | ICD-10-CM

## 2023-10-12 DIAGNOSIS — N2889 Other specified disorders of kidney and ureter: Secondary | ICD-10-CM | POA: Diagnosis not present

## 2023-10-12 DIAGNOSIS — K8020 Calculus of gallbladder without cholecystitis without obstruction: Secondary | ICD-10-CM | POA: Diagnosis not present

## 2023-10-12 LAB — URINALYSIS, ROUTINE W REFLEX MICROSCOPIC
Bilirubin, UA: NEGATIVE
Glucose, UA: NEGATIVE
Ketones, UA: NEGATIVE
Leukocytes,UA: NEGATIVE
Nitrite, UA: NEGATIVE
Specific Gravity, UA: 1.02 (ref 1.005–1.030)
Urobilinogen, Ur: 1 mg/dL (ref 0.2–1.0)
pH, UA: 5.5 (ref 5.0–7.5)

## 2023-10-12 LAB — MICROSCOPIC EXAMINATION
Bacteria, UA: NONE SEEN
Casts: NONE SEEN /[LPF]
Crystals: NONE SEEN
Epithelial Cells (non renal): NONE SEEN /[HPF] (ref 0–10)
Mucus, UA: NONE SEEN

## 2023-10-12 MED ORDER — IOHEXOL 300 MG/ML  SOLN
125.0000 mL | Freq: Once | INTRAMUSCULAR | Status: AC | PRN
  Administered 2023-10-12: 125 mL via INTRAVENOUS

## 2023-10-12 NOTE — Progress Notes (Signed)
Assessment: 1. Gross hematuria   2. History of prostate cancer   3. Urinary incontinence with continuous leakage     Plan: I personally reviewed the patient's chart including provider notes, labs and imaging results. I personally reviewed the CT study from 10/10/2023 with results as noted below.  Today I had a discussion with the patient regarding the findings of gross hematuria including the implications and differential diagnoses associated with it.  I also discussed recommendations for further evaluation including the rationale for upper tract imaging and cystoscopy.  I discussed the nature of these procedures including potential risk and complications.  The patient expressed an understanding of these issues. Recommend further evaluation with a formal CT hematuria protocol followed by cystoscopy   Chief Complaint:  Chief Complaint  Patient presents with   Hematuria    History of Present Illness:  Bobby Bradley is a 78 y.o. male who is seen in consultation from Copland, Gwenlyn Found, MD for evaluation of gross hematuria. He recently noted some blood on his incontinence pad.  He had an episode approximately 3 weeks ago with blood on his incontinence pad.  This lasted for 2 days then resolved.  No other associated symptoms including dysuria or flank pain. Urinalysis from 10/05/2023 showed 3-6 RBCs. Urine culture showed no growth. CT renal stone study from 10/10/2023 showed left renal calculi, possible left renal cyst, no evidence of hydronephrosis or ureteral calculi.  He has a history of prostate cancer and is status post a radical prostatectomy 20+ years ago in [place]. PSA from 1/25: 0.06  He has had urinary incontinence following his prostatectomy.  He underwent a male sling a number of years ago.  Since that time, he has had gradual worsening of his incontinence.  He wears a depends throughout the day due to fairly consistent leakage.  He does report frequency, urgency, nocturia.  He  voids with a good stream and feels like he empties his bladder completely.   Past Medical History:  Past Medical History:  Diagnosis Date   A-fib (HCC)    AICD (automatic cardioverter/defibrillator) present    CHF (congestive heart failure) (HCC)    Colon polyps    Diabetes mellitus without complication (HCC)    Dysrhythmia    Essential hypertension    Hyperlipidemia    Non-ischemic cardiomyopathy (HCC)    Presence of combination internal cardiac defibrillator (ICD) and pacemaker    Presence of permanent cardiac pacemaker    Prostate cancer (HCC)    Stroke (HCC)    TIA- 1994   TIA (transient ischemic attack)     Past Surgical History:  Past Surgical History:  Procedure Laterality Date   BACK SURGERY     CARDIAC CATHETERIZATION     CARDIOVERSION N/A 03/29/2023   Procedure: CARDIOVERSION;  Surgeon: Lewayne Bunting, MD;  [facility] INVASIVE CV LAB;  Service: Cardiovascular;  Laterality: N/A;   CLIPPING OF ATRIAL APPENDAGE N/A 02/26/2023   Procedure: CLIPPING OF LEFT ATRIAL APPENDAGE;  Surgeon: Corliss Skains, MD;  Location: [facility] OR;  Service: Open Heart Surgery;  Laterality: N/A;   COLONOSCOPY     about 10 years. Done in [place] or [place]    CORONARY ARTERY BYPASS GRAFT N/A 02/26/2023   Procedure: CORONARY ARTERY BYPASS GRAFTING (CABG) X 4 WITH LEFT INTERNAL MAMMARY ARTERY HARVEST AND RIGHT GREATER SAPHENOUS VEIN HARVESTED ENDOSCOPICALLY;  Surgeon: Corliss Skains, MD;  Location: [facility] OR;  Service: Open Heart Surgery;  Laterality: N/A;   ICD IMPLANT  2012   PROSTATECTOMY     RIGHT/LEFT HEART CATH AND CORONARY ANGIOGRAPHY N/A 02/12/2023   Procedure: RIGHT/LEFT HEART CATH AND CORONARY ANGIOGRAPHY;  Surgeon: Kathleene Hazel, MD;  [facility] INVASIVE CV LAB;  Service: Cardiovascular;  Laterality: N/A;   TEE WITHOUT CARDIOVERSION N/A 02/26/2023   Procedure: TRANSESOPHAGEAL ECHOCARDIOGRAM;  Surgeon: Corliss Skains, MD;  Location: [facility] OR;   Service: Open Heart Surgery;  Laterality: N/A;    Allergies:  Allergies  Allergen Reactions   Lisinopril     Extreme diarrhea    Family History:  Family History  Problem Relation Age of Onset   Lung cancer Mother    Uterine cancer Mother    Lung cancer Father    Atrial fibrillation Sister    Lung cancer Brother    Colon cancer Neg Hx    Esophageal cancer Neg Hx    Pancreatic cancer Neg Hx    Liver disease Neg Hx    Stomach cancer Neg Hx    Rectal cancer Neg Hx     Social History:  Social History   Tobacco Use   Smoking status: Former    Current packs/day: 0.00    Types: Cigarettes    Quit date: 09/12/1983    Years since quitting: 40.1    Passive exposure: Never   Smokeless tobacco: Never  Vaping Use   Vaping status: Never Used  Substance Use Topics   Alcohol use: Yes    Comment: very rare   Drug use: No    Review of symptoms:  Constitutional:  Negative for unexplained weight loss, night sweats, fever, chills ENT:  Negative for nose bleeds, sinus pain, painful swallowing CV:  Negative for chest pain, shortness of breath, exercise intolerance, palpitations, loss of consciousness Resp:  Negative for cough, wheezing, shortness of breath GI:  Negative for nausea, vomiting, diarrhea, bloody stools GU:  Positives noted in HPI; otherwise negative for dysuria Neuro:  Negative for seizures, poor balance, limb weakness, slurred speech Psych:  Negative for lack of energy, depression, anxiety Endocrine:  Negative for polydipsia, polyuria, symptoms of hypoglycemia (dizziness, hunger, sweating) Hematologic:  Negative for anemia, purpura, petechia, prolonged or excessive bleeding, use of anticoagulants  Allergic:  Negative for difficulty breathing or choking as a result of exposure to anything; no shellfish allergy; no allergic response (rash/itch) to materials, foods  Physical exam: BP 136/68   Pulse 62   Ht 6' (1.829 m)   Wt 237 lb (107.5 kg)   BMI 32.14 kg/m  GENERAL  APPEARANCE:  Well appearing, well developed, well nourished, NAD HEENT: Atraumatic, Normocephalic, oropharynx clear. NECK: Supple without lymphadenopathy or thyromegaly. LUNGS: Clear to auscultation bilaterally. HEART: Regular Rate and Rhythm without murmurs, gallops, or rubs. ABDOMEN: Soft, non-tender, No Masses. EXTREMITIES: Moves all extremities well.  Without clubbing, cyanosis, or edema. NEUROLOGIC:  Alert and oriented x 3, normal gait, CN II-XII grossly intact.  MENTAL STATUS:  Appropriate. BACK:  Non-tender to palpation.  No CVAT SKIN:  Warm, dry and intact.   GU: Penis:  circumcised Meatus: Normal Scrotum: normal, no masses Testis: normal without masses bilateral  Results: U/A: 0-5 WBCs, 0-2 RBCs

## 2023-10-22 ENCOUNTER — Encounter: Payer: Self-pay | Admitting: Family Medicine

## 2023-10-24 ENCOUNTER — Encounter: Payer: Self-pay | Admitting: *Deleted

## 2023-10-24 DIAGNOSIS — I480 Paroxysmal atrial fibrillation: Secondary | ICD-10-CM

## 2023-10-24 DIAGNOSIS — E785 Hyperlipidemia, unspecified: Secondary | ICD-10-CM

## 2023-10-25 ENCOUNTER — Encounter: Payer: Self-pay | Admitting: Urology

## 2023-10-30 ENCOUNTER — Other Ambulatory Visit: Payer: Self-pay | Admitting: *Deleted

## 2023-10-30 DIAGNOSIS — E785 Hyperlipidemia, unspecified: Secondary | ICD-10-CM

## 2023-10-30 DIAGNOSIS — I480 Paroxysmal atrial fibrillation: Secondary | ICD-10-CM

## 2023-10-30 NOTE — Addendum Note (Signed)
Addended by: Freddi Starr on: 10/30/2023 08:50 AM   Modules accepted: Orders

## 2023-11-06 ENCOUNTER — Encounter: Payer: Self-pay | Admitting: Urology

## 2023-11-06 ENCOUNTER — Ambulatory Visit (INDEPENDENT_AMBULATORY_CARE_PROVIDER_SITE_OTHER): Payer: Medicare Other | Admitting: Urology

## 2023-11-06 VITALS — BP 155/76 | HR 60 | Ht 72.0 in | Wt 237.0 lb

## 2023-11-06 DIAGNOSIS — N200 Calculus of kidney: Secondary | ICD-10-CM | POA: Diagnosis not present

## 2023-11-06 DIAGNOSIS — E785 Hyperlipidemia, unspecified: Secondary | ICD-10-CM | POA: Diagnosis not present

## 2023-11-06 DIAGNOSIS — N281 Cyst of kidney, acquired: Secondary | ICD-10-CM

## 2023-11-06 DIAGNOSIS — I480 Paroxysmal atrial fibrillation: Secondary | ICD-10-CM | POA: Diagnosis not present

## 2023-11-06 DIAGNOSIS — N3945 Continuous leakage: Secondary | ICD-10-CM | POA: Diagnosis not present

## 2023-11-06 DIAGNOSIS — Z8546 Personal history of malignant neoplasm of prostate: Secondary | ICD-10-CM | POA: Diagnosis not present

## 2023-11-06 DIAGNOSIS — R310 Gross hematuria: Secondary | ICD-10-CM | POA: Diagnosis not present

## 2023-11-06 DIAGNOSIS — B349 Viral infection, unspecified: Secondary | ICD-10-CM | POA: Diagnosis not present

## 2023-11-06 LAB — URINALYSIS, ROUTINE W REFLEX MICROSCOPIC
Bilirubin, UA: NEGATIVE
Glucose, UA: NEGATIVE
Ketones, UA: NEGATIVE
Leukocytes,UA: NEGATIVE
Nitrite, UA: NEGATIVE
Specific Gravity, UA: 1.025 (ref 1.005–1.030)
Urobilinogen, Ur: 0.2 mg/dL (ref 0.2–1.0)
pH, UA: 6.5 (ref 5.0–7.5)

## 2023-11-06 LAB — MICROSCOPIC EXAMINATION

## 2023-11-06 MED ORDER — CIPROFLOXACIN HCL 500 MG PO TABS
500.0000 mg | ORAL_TABLET | Freq: Once | ORAL | Status: AC
  Administered 2023-11-06: 500 mg via ORAL

## 2023-11-06 NOTE — Progress Notes (Signed)
 Assessment: 1. Gross hematuria   2. History of prostate cancer   3. Urinary incontinence with continuous leakage   4. Nephrolithiasis   5. Renal cyst, left     Plan: I personally reviewed the CT study from 10/25/2023 with results as noted below. I discussed  the CT results as well as the cystoscopy results with the patient today. Urine cytology sent today. Cipro x 1 following cystoscopy. Return to office in 6 weeks.  Chief Complaint:  Chief Complaint  Patient presents with   Hematuria    History of Present Illness:  Bobby Bradley is a 78 y.o. male who is seen for further evaluation of gross hematuria. He recently noted some blood on his incontinence pad.  He had an episode approximately 3 weeks ago with blood on his incontinence pad.  This lasted for 2 days then resolved.  Bradley other associated symptoms including dysuria or flank pain. Urinalysis from 10/05/2023 showed 3-6 RBCs. Urine culture showed Bradley growth. CT renal stone study from 10/10/2023 showed left renal calculi, possible left renal cyst, Bradley evidence of hydronephrosis or ureteral calculi.  He has a history of prostate cancer and is status post a radical prostatectomy 20+ years ago in [place]. PSA from 1/25: 0.06  He has had urinary incontinence following his prostatectomy.  He underwent a male sling a number of years ago.  Since that time, he has had gradual worsening of his incontinence.  He wears a depends throughout the day due to fairly consistent leakage.  He does report frequency, urgency, nocturia.  He voids with a good stream and feels like he empties his bladder completely.  CT hematuria profile from 10/25/2023 showed 2 nonobstructing calculi in the left kidney with the largest measuring 7 x 10 mm, Bradley right sided renal calculi, Bradley ureteral calculi or obstruction, Bradley filling defect, left renal cyst measuring 2.8 x 3.1 cm.Marland Kitchen  He presents today for further evaluation with cystoscopy. Bradley further episodes of gross  hematuria.  Bradley flank pain or dysuria.  His urinary symptoms remain stable. IPSS = 15.  Portions of the above documentation were copied from a prior visit for review purposes only.   Past Medical History:  Past Medical History:  Diagnosis Date   A-fib (HCC)    AICD (automatic cardioverter/defibrillator) present    CHF (congestive heart failure) (HCC)    Colon polyps    Diabetes mellitus without complication (HCC)    Dysrhythmia    Essential hypertension    Hyperlipidemia    Non-ischemic cardiomyopathy (HCC)    Presence of combination internal cardiac defibrillator (ICD) and pacemaker    Presence of permanent cardiac pacemaker    Prostate cancer (HCC)    Stroke (HCC)    TIA- 1994   TIA (transient ischemic attack)     Past Surgical History:  Past Surgical History:  Procedure Laterality Date   BACK SURGERY     CARDIAC CATHETERIZATION     CARDIOVERSION N/A 03/29/2023   Procedure: CARDIOVERSION;  Surgeon: Lewayne Bunting, MD;  [facility];  Service: Cardiovascular;  Laterality: N/A;   CLIPPING OF ATRIAL APPENDAGE N/A 02/26/2023   Procedure: CLIPPING OF LEFT ATRIAL APPENDAGE;  Surgeon: Corliss Skains, MD;  Location: [facility];  Service: Open Heart Surgery;  Laterality: N/A;   COLONOSCOPY     about 10 years. Done in [place]    CORONARY ARTERY BYPASS GRAFT N/A 02/26/2023   Procedure: CORONARY ARTERY BYPASS GRAFTING (CABG) X 4  WITH LEFT INTERNAL MAMMARY ARTERY HARVEST AND RIGHT GREATER SAPHENOUS VEIN HARVESTED ENDOSCOPICALLY;  Surgeon: Corliss Skains, MD;  Location: [facility];  Service: Open Heart Surgery;  Laterality: N/A;   ICD IMPLANT  2012   PROSTATECTOMY     RIGHT/LEFT HEART CATH AND CORONARY ANGIOGRAPHY N/A 02/12/2023   Procedure: RIGHT/LEFT HEART CATH AND CORONARY ANGIOGRAPHY;  Surgeon: Kathleene Hazel, MD;  [facility];  Service: Cardiovascular;  Laterality: N/A;   TEE WITHOUT CARDIOVERSION N/A  02/26/2023   Procedure: TRANSESOPHAGEAL ECHOCARDIOGRAM;  Surgeon: Corliss Skains, MD;  Location: [facility];  Service: Open Heart Surgery;  Laterality: N/A;    Allergies:  Allergies  Allergen Reactions   Lisinopril     Extreme diarrhea    Family History:  Family History  Problem Relation Age of Onset   Lung cancer Mother    Uterine cancer Mother    Lung cancer Father    Atrial fibrillation Sister    Lung cancer Brother    Colon cancer Neg Hx    Esophageal cancer Neg Hx    Pancreatic cancer Neg Hx    Liver disease Neg Hx    Stomach cancer Neg Hx    Rectal cancer Neg Hx     Social History:  Social History   Tobacco Use   Smoking status: Former    Current packs/day: 0.00    Types: Cigarettes    Quit date: 09/12/1983    Years since quitting: 40.1    Passive exposure: Never   Smokeless tobacco: Never  Vaping Use   Vaping status: Never Used  Substance Use Topics   Alcohol use: Yes    Comment: very rare   Drug use: Bradley    ROS: Constitutional:  Negative for fever, chills, weight loss CV: Negative for chest pain, previous MI, hypertension Respiratory:  Negative for shortness of breath, wheezing, sleep apnea, frequent cough GI:  Negative for nausea, vomiting, bloody stool, GERD   Physical exam: BP (!) 155/76   Pulse 60   Ht 6' (1.829 m)   Wt 237 lb (107.5 kg)   BMI 32.14 kg/m  GENERAL APPEARANCE:  Well appearing, well developed, well nourished, NAD HEENT:  Atraumatic, normocephalic, oropharynx clear NECK:  Supple without lymphadenopathy or thyromegaly ABDOMEN:  Soft, non-tender, Bradley masses EXTREMITIES:  Moves all extremities well, without clubbing, cyanosis, or edema NEUROLOGIC:  Alert and oriented x 3, normal gait, CN II-XII grossly intact MENTAL STATUS:  appropriate BACK:  Non-tender to palpation, Bradley CVAT SKIN:  Warm, dry, and intact  Results: U/A: 3-11 RBC, 0-5 WBC  Procedure:  Flexible Cystourethroscopy  Pre-operative Diagnosis: Gross  hematuria  Post-operative Diagnosis: Gross hematuria  Anesthesia:  local with lidocaine jelly  Surgical Narrative:  After appropriate informed consent was obtained, the patient was prepped and draped in the usual sterile fashion in the supine position.  The patient was correctly identified and the proper procedure delineated prior to proceeding.  Sterile lidocaine gel was instilled in the urethra. The flexible cystoscope was introduced without difficulty.  Findings:  Anterior urethra: Normal  Posterior urethra: Post radical prostatectomy  Bladder: Normal  Ureteral orifices: normal with clear efflux bilaterally  Additional findings: none  Saline bladder wash for cytology was performed.    The cystoscope was then removed.  The patient tolerated the procedure well.

## 2023-11-07 LAB — LIPID PANEL
Chol/HDL Ratio: 3.1 {ratio} (ref 0.0–5.0)
Cholesterol, Total: 120 mg/dL (ref 100–199)
HDL: 39 mg/dL — ABNORMAL LOW (ref >39)
LDL Chol Calc (NIH): 52 mg/dL (ref 0–99)
Triglycerides: 172 mg/dL — ABNORMAL HIGH (ref 0–149)
VLDL Cholesterol Cal: 29 mg/dL (ref 5–40)

## 2023-11-07 LAB — TSH: TSH: 4.23 u[IU]/mL (ref 0.450–4.500)

## 2023-11-12 ENCOUNTER — Encounter: Payer: Self-pay | Admitting: Urology

## 2023-11-29 ENCOUNTER — Other Ambulatory Visit: Payer: Self-pay | Admitting: Family Medicine

## 2023-11-29 DIAGNOSIS — E785 Hyperlipidemia, unspecified: Secondary | ICD-10-CM

## 2023-12-10 ENCOUNTER — Ambulatory Visit: Payer: Medicare Other

## 2023-12-10 DIAGNOSIS — I480 Paroxysmal atrial fibrillation: Secondary | ICD-10-CM

## 2023-12-10 LAB — CUP PACEART REMOTE DEVICE CHECK
Battery Remaining Longevity: 96 mo
Battery Remaining Percentage: 85 %
Brady Statistic RV Percent Paced: 21 %
Date Time Interrogation Session: 20250331080500
HighPow Impedance: 48 Ohm
Implantable Lead Connection Status: 753985
Implantable Lead Implant Date: 20160308
Implantable Lead Location: 753860
Implantable Lead Model: 295
Implantable Lead Serial Number: 363165
Implantable Pulse Generator Implant Date: 20160308
Lead Channel Impedance Value: 383 Ohm
Lead Channel Pacing Threshold Amplitude: 0.6 V
Lead Channel Pacing Threshold Pulse Width: 0.5 ms
Lead Channel Setting Pacing Amplitude: 2.4 V
Lead Channel Setting Pacing Pulse Width: 0.5 ms
Lead Channel Setting Sensing Sensitivity: 0.6 mV
Pulse Gen Serial Number: 200269

## 2023-12-17 ENCOUNTER — Telehealth: Payer: Self-pay | Admitting: Urology

## 2023-12-17 NOTE — Telephone Encounter (Signed)
 Pt scheduled 12/20/23

## 2023-12-20 ENCOUNTER — Ambulatory Visit (INDEPENDENT_AMBULATORY_CARE_PROVIDER_SITE_OTHER): Admitting: Urology

## 2023-12-20 ENCOUNTER — Encounter: Payer: Self-pay | Admitting: Urology

## 2023-12-20 VITALS — BP 159/73 | HR 56 | Ht 72.0 in | Wt 247.0 lb

## 2023-12-20 DIAGNOSIS — Z8546 Personal history of malignant neoplasm of prostate: Secondary | ICD-10-CM | POA: Diagnosis not present

## 2023-12-20 DIAGNOSIS — N3945 Continuous leakage: Secondary | ICD-10-CM

## 2023-12-20 DIAGNOSIS — R310 Gross hematuria: Secondary | ICD-10-CM | POA: Diagnosis not present

## 2023-12-20 DIAGNOSIS — N200 Calculus of kidney: Secondary | ICD-10-CM | POA: Insufficient documentation

## 2023-12-20 DIAGNOSIS — Z87898 Personal history of other specified conditions: Secondary | ICD-10-CM

## 2023-12-20 DIAGNOSIS — N289 Disorder of kidney and ureter, unspecified: Secondary | ICD-10-CM | POA: Diagnosis not present

## 2023-12-20 DIAGNOSIS — R319 Hematuria, unspecified: Secondary | ICD-10-CM | POA: Diagnosis not present

## 2023-12-20 DIAGNOSIS — N281 Cyst of kidney, acquired: Secondary | ICD-10-CM | POA: Diagnosis not present

## 2023-12-20 DIAGNOSIS — N219 Calculus of lower urinary tract, unspecified: Secondary | ICD-10-CM | POA: Diagnosis not present

## 2023-12-20 LAB — URINALYSIS, ROUTINE W REFLEX MICROSCOPIC
Bilirubin, UA: NEGATIVE
Glucose, UA: NEGATIVE
Ketones, UA: NEGATIVE
Leukocytes,UA: NEGATIVE
Nitrite, UA: NEGATIVE
Specific Gravity, UA: 1.015 (ref 1.005–1.030)
Urobilinogen, Ur: 1 mg/dL (ref 0.2–1.0)
pH, UA: 7 (ref 5.0–7.5)

## 2023-12-20 LAB — MICROSCOPIC EXAMINATION

## 2023-12-20 NOTE — Progress Notes (Signed)
 Assessment: 1. Gross hematuria - negative evaluation 2/25   2. History of prostate cancer   3. Urinary incontinence with continuous leakage   4. Nephrolithiasis   5. Renal cyst, left     Plan: Hematuria profile sent today Will contact him with results. Will continue to monitor at this time for any recurrent gross hematuria. Return to office in 3 months.  Chief Complaint:  Chief Complaint  Patient presents with   Hematuria    History of Present Illness:  Bobby Bradley is a 78 y.o. male who is seen for further evaluation of gross hematuria. He had an episode with blood on his incontinence pad.  This lasted for 2 days then resolved.  No other associated symptoms including dysuria or flank pain. Urinalysis from 10/05/2023 showed 3-6 RBCs. Urine culture showed no growth. CT renal stone study from 10/10/2023 showed left renal calculi, possible left renal cyst, no evidence of hydronephrosis or ureteral calculi.  He has a history of prostate cancer and is status post a radical prostatectomy 20+ years ago in [place]. PSA from 1/25: 0.06  He has had urinary incontinence following his prostatectomy.  He underwent a male sling a number of years ago.  Since that time, he has had gradual worsening of his incontinence.  He wears a depends throughout the day due to fairly consistent leakage.  He does report frequency, urgency, nocturia.  He voids with a good stream and feels like he empties his bladder completely.  CT hematuria profile from 10/25/2023 showed 2 nonobstructing calculi in the left kidney with the largest measuring 7 x 10 mm, no right sided renal calculi, no ureteral calculi or obstruction, no filling defect, left renal cyst measuring 2.8 x 3.1 cm..  At his visit in 2/25, he reported no further episodes of gross hematuria.  No flank pain or dysuria.  His urinary symptoms remain stable. IPSS = 15. Cystoscopy from 2/25 showed no urethral or bladder abnormalities. Urine cytology was  negative for malignant cells or dysplasia.  He returns today for follow-up.  He reports an episode of gross hematuria lasting 2 days beginning on 12/15/2023.  He did not pass any clots.  No flank pain or dysuria.  The gross hematuria resolved.  He continues to have frequency, urgency, and incontinence.  Portions of the above documentation were copied from a prior visit for review purposes only.   Past Medical History:  Past Medical History:  Diagnosis Date   A-fib (HCC)    AICD (automatic cardioverter/defibrillator) present    CHF (congestive heart failure) (HCC)    Colon polyps    Diabetes mellitus without complication (HCC)    Dysrhythmia    Essential hypertension    Hyperlipidemia    Non-ischemic cardiomyopathy (HCC)    Presence of combination internal cardiac defibrillator (ICD) and pacemaker    Presence of permanent cardiac pacemaker    Prostate cancer (HCC)    Stroke (HCC)    TIA- 1994   TIA (transient ischemic attack)     Past Surgical History:  Past Surgical History:  Procedure Laterality Date   BACK SURGERY     CARDIAC CATHETERIZATION     CARDIOVERSION N/A 03/29/2023   Procedure: CARDIOVERSION;  Surgeon: Lewayne Bunting, MD;  [facility];  Service: Cardiovascular;  Laterality: N/A;   CLIPPING OF ATRIAL APPENDAGE N/A 02/26/2023   Procedure: CLIPPING OF LEFT ATRIAL APPENDAGE;  Surgeon: Corliss Skains, MD;  [facility];  Service: Open Heart Surgery;  Laterality:  N/A;   COLONOSCOPY     about 10 years. Done in [place]    CORONARY ARTERY BYPASS GRAFT N/A 02/26/2023   Procedure: CORONARY ARTERY BYPASS GRAFTING (CABG) X 4 WITH LEFT INTERNAL MAMMARY ARTERY HARVEST AND RIGHT GREATER SAPHENOUS VEIN HARVESTED ENDOSCOPICALLY;  Surgeon: Corliss Skains, MD;  [facility];  Service: Open Heart Surgery;  Laterality: N/A;   ICD IMPLANT  2012   PROSTATECTOMY     RIGHT/LEFT HEART CATH AND CORONARY ANGIOGRAPHY N/A 02/12/2023    Procedure: RIGHT/LEFT HEART CATH AND CORONARY ANGIOGRAPHY;  Surgeon: Kathleene Hazel, MD;  [facility];  Service: Cardiovascular;  Laterality: N/A;   TEE WITHOUT CARDIOVERSION N/A 02/26/2023   Procedure: TRANSESOPHAGEAL ECHOCARDIOGRAM;  Surgeon: Corliss Skains, MD;  [facility];  Service: Open Heart Surgery;  Laterality: N/A;    Allergies:  Allergies  Allergen Reactions   Lisinopril     Extreme diarrhea    Family History:  Family History  Problem Relation Age of Onset   Lung cancer Mother    Uterine cancer Mother    Lung cancer Father    Atrial fibrillation Sister    Lung cancer Brother    Colon cancer Neg Hx    Esophageal cancer Neg Hx    Pancreatic cancer Neg Hx    Liver disease Neg Hx    Stomach cancer Neg Hx    Rectal cancer Neg Hx     Social History:  Social History   Tobacco Use   Smoking status: Former    Current packs/day: 0.00    Types: Cigarettes    Quit date: 09/12/1983    Years since quitting: 40.2    Passive exposure: Never   Smokeless tobacco: Never  Vaping Use   Vaping status: Never Used  Substance Use Topics   Alcohol use: Yes    Comment: very rare   Drug use: No    ROS: Constitutional:  Negative for fever, chills, weight loss CV: Negative for chest pain, previous MI, hypertension Respiratory:  Negative for shortness of breath, wheezing, sleep apnea, frequent cough GI:  Negative for nausea, vomiting, bloody stool, GERD   Physical exam: BP (!) 159/73   Pulse (!) 56   Ht 6' (1.829 m)   Wt 247 lb (112 kg)   BMI 33.50 kg/m  GENERAL APPEARANCE:  Well appearing, well developed, well nourished, NAD HEENT:  Atraumatic, normocephalic, oropharynx clear NECK:  Supple without lymphadenopathy or thyromegaly ABDOMEN:  Soft, non-tender, no masses EXTREMITIES:  Moves all extremities well, without clubbing, cyanosis, or edema NEUROLOGIC:  Alert and oriented x 3, normal gait, CN II-XII grossly intact MENTAL STATUS:   appropriate BACK:  Non-tender to palpation, No CVAT SKIN:  Warm, dry, and intact  Results: U/A:  3-10 RBC, 0-5 WBC

## 2023-12-26 ENCOUNTER — Encounter: Payer: Self-pay | Admitting: Urology

## 2024-01-03 ENCOUNTER — Encounter: Payer: Self-pay | Admitting: Urology

## 2024-01-03 ENCOUNTER — Encounter: Payer: Self-pay | Admitting: *Deleted

## 2024-01-04 ENCOUNTER — Encounter: Payer: Self-pay | Admitting: Gastroenterology

## 2024-01-04 ENCOUNTER — Other Ambulatory Visit: Payer: Self-pay | Admitting: Urology

## 2024-01-04 ENCOUNTER — Other Ambulatory Visit (INDEPENDENT_AMBULATORY_CARE_PROVIDER_SITE_OTHER)

## 2024-01-04 DIAGNOSIS — D509 Iron deficiency anemia, unspecified: Secondary | ICD-10-CM

## 2024-01-04 DIAGNOSIS — R310 Gross hematuria: Secondary | ICD-10-CM

## 2024-01-04 LAB — CBC WITH DIFFERENTIAL/PLATELET
Basophils Absolute: 0 10*3/uL (ref 0.0–0.1)
Basophils Relative: 0.7 % (ref 0.0–3.0)
Eosinophils Absolute: 0.2 10*3/uL (ref 0.0–0.7)
Eosinophils Relative: 2.7 % (ref 0.0–5.0)
HCT: 46.9 % (ref 39.0–52.0)
Hemoglobin: 15.4 g/dL (ref 13.0–17.0)
Lymphocytes Relative: 17 % (ref 12.0–46.0)
Lymphs Abs: 1.2 10*3/uL (ref 0.7–4.0)
MCHC: 32.8 g/dL (ref 30.0–36.0)
MCV: 90.9 fl (ref 78.0–100.0)
Monocytes Absolute: 0.8 10*3/uL (ref 0.1–1.0)
Monocytes Relative: 11.5 % (ref 3.0–12.0)
Neutro Abs: 5 10*3/uL (ref 1.4–7.7)
Neutrophils Relative %: 68.1 % (ref 43.0–77.0)
Platelets: 240 10*3/uL (ref 150.0–400.0)
RBC: 5.16 Mil/uL (ref 4.22–5.81)
RDW: 15.4 % (ref 11.5–15.5)
WBC: 7.3 10*3/uL (ref 4.0–10.5)

## 2024-01-04 LAB — IBC + FERRITIN
Ferritin: 25.6 ng/mL (ref 22.0–322.0)
Iron: 69 ug/dL (ref 42–165)
Saturation Ratios: 17.3 % — ABNORMAL LOW (ref 20.0–50.0)
TIBC: 399 ug/dL (ref 250.0–450.0)
Transferrin: 285 mg/dL (ref 212.0–360.0)

## 2024-01-25 NOTE — Progress Notes (Signed)
 Remote ICD transmission.

## 2024-01-28 ENCOUNTER — Telehealth: Payer: Self-pay | Admitting: Urology

## 2024-01-28 NOTE — Telephone Encounter (Signed)
 Called [facility] they have received the referral. They will be contacting him once reviewing to schedule.   Called patient and relayed information.

## 2024-02-19 ENCOUNTER — Other Ambulatory Visit: Payer: Self-pay | Admitting: Urology

## 2024-02-19 DIAGNOSIS — R310 Gross hematuria: Secondary | ICD-10-CM

## 2024-03-10 ENCOUNTER — Ambulatory Visit: Payer: Medicare Other

## 2024-03-10 ENCOUNTER — Ambulatory Visit: Payer: Self-pay | Admitting: Cardiology

## 2024-03-10 DIAGNOSIS — I480 Paroxysmal atrial fibrillation: Secondary | ICD-10-CM

## 2024-03-10 LAB — CUP PACEART REMOTE DEVICE CHECK
Battery Remaining Longevity: 90 mo
Battery Remaining Percentage: 81 %
Brady Statistic RV Percent Paced: 26 %
Date Time Interrogation Session: 20250630041400
HighPow Impedance: 46 Ohm
Implantable Lead Connection Status: 753985
Implantable Lead Implant Date: 20160308
Implantable Lead Location: 753860
Implantable Lead Model: 295
Implantable Lead Serial Number: 363165
Implantable Pulse Generator Implant Date: 20160308
Lead Channel Impedance Value: 358 Ohm
Lead Channel Pacing Threshold Amplitude: 0.6 V
Lead Channel Pacing Threshold Pulse Width: 0.5 ms
Lead Channel Setting Pacing Amplitude: 2.4 V
Lead Channel Setting Pacing Pulse Width: 0.5 ms
Lead Channel Setting Sensing Sensitivity: 0.6 mV
Pulse Gen Serial Number: 200269

## 2024-03-17 DIAGNOSIS — R319 Hematuria, unspecified: Secondary | ICD-10-CM | POA: Diagnosis not present

## 2024-03-17 DIAGNOSIS — N200 Calculus of kidney: Secondary | ICD-10-CM | POA: Diagnosis not present

## 2024-03-17 DIAGNOSIS — I129 Hypertensive chronic kidney disease with stage 1 through stage 4 chronic kidney disease, or unspecified chronic kidney disease: Secondary | ICD-10-CM | POA: Diagnosis not present

## 2024-03-17 DIAGNOSIS — N189 Chronic kidney disease, unspecified: Secondary | ICD-10-CM | POA: Diagnosis not present

## 2024-03-17 DIAGNOSIS — R809 Proteinuria, unspecified: Secondary | ICD-10-CM | POA: Diagnosis not present

## 2024-03-17 DIAGNOSIS — N179 Acute kidney failure, unspecified: Secondary | ICD-10-CM | POA: Diagnosis not present

## 2024-03-17 LAB — COMPREHENSIVE METABOLIC PANEL WITH GFR
Albumin: 4.4 (ref 3.5–5.0)
Calcium: 9.1 (ref 8.7–10.7)
eGFR: 69

## 2024-03-17 LAB — BASIC METABOLIC PANEL WITH GFR
BUN: 16 (ref 4–21)
CO2: 19 (ref 13–22)
Chloride: 105 (ref 99–108)
Creatinine: 1.1 (ref 0.6–1.3)
Glucose: 100
Potassium: 4.3 meq/L (ref 3.5–5.1)
Sodium: 140 (ref 137–147)

## 2024-03-17 LAB — PROTEIN / CREATININE RATIO, URINE: Creatinine, Urine: 21.4

## 2024-03-19 NOTE — Progress Notes (Unsigned)
 Assessment: 1. Gross hematuria - negative evaluation 2/25   2. History of prostate cancer   3. Urinary incontinence with continuous leakage   4. Nephrolithiasis   5. Renal cyst, left     Plan: Will continue to monitor at this time for any recurrent gross hematuria. Return to office in 3 months.  Chief Complaint:  No chief complaint on file.   History of Present Illness:  Bobby Bradley is a 78 y.o. male who is seen for further evaluation of gross hematuria. He had an episode with blood on his incontinence pad.  This lasted for 2 days then resolved.  No other associated symptoms including dysuria or flank pain. Urinalysis from 10/05/2023 showed 3-6 RBCs. Urine culture showed no growth. CT renal stone study from 10/10/2023 showed left renal calculi, possible left renal cyst, no evidence of hydronephrosis or ureteral calculi.  He has a history of prostate cancer and is status post a radical prostatectomy 20+ years ago in [place] . PSA from 1/25: 0.06  He has had urinary incontinence following his prostatectomy.  He underwent a male sling a number of years ago.  Since that time, he has had gradual worsening of his incontinence.  He wears a depends throughout the day due to fairly consistent leakage.  He does report frequency, urgency, nocturia.  He voids with a good stream and feels like he empties his bladder completely.  CT hematuria profile from 10/25/2023 showed 2 nonobstructing calculi in the left kidney with the largest measuring 7 x 10 mm, no right sided renal calculi, no ureteral calculi or obstruction, no filling defect, left renal cyst measuring 2.8 x 3.1 cm..  At his visit in 2/25, he reported no further episodes of gross hematuria.  No flank pain or dysuria.  His urinary symptoms remain stable. IPSS = 15. Cystoscopy from 2/25 showed no urethral or bladder abnormalities. Urine cytology was negative for malignant cells or dysplasia.  He reported an episode of gross hematuria  lasting 2 days beginning on 12/15/2023.  He did not pass any clots.  No flank pain or dysuria.  The gross hematuria resolved.  He continued to have frequency, urgency, and incontinence. Hematuria profile from 4/25 showed no evidence of malignant cells or dysplasia, renal tubular cells, isomorphic erythrocytes indicating lower urinary tract/uroepithelial bleeding, elevated microalbuminuria, proteinuria. Referral to nephrology initiated.   Portions of the above documentation were copied from a prior visit for review purposes only.   Past Medical History:  Past Medical History:  Diagnosis Date   A-fib (HCC)    AICD (automatic cardioverter/defibrillator) present    CHF (congestive heart failure) (HCC)    Colon polyps    Diabetes mellitus without complication (HCC)    Dysrhythmia    Essential hypertension    Hyperlipidemia    Non-ischemic cardiomyopathy (HCC)    Presence of combination internal cardiac defibrillator (ICD) and pacemaker    Presence of permanent cardiac pacemaker    Prostate cancer (HCC)    Stroke (HCC)    TIA- 1994   TIA (transient ischemic attack)     Past Surgical History:  Past Surgical History:  Procedure Laterality Date   BACK SURGERY     CARDIAC CATHETERIZATION     CARDIOVERSION N/A 03/29/2023   Procedure: CARDIOVERSION;  Surgeon: Pietro Redell RAMAN, MD;  [facility];  Service: Cardiovascular;  Laterality: N/A;   CLIPPING OF ATRIAL APPENDAGE N/A 02/26/2023   Procedure: CLIPPING OF LEFT ATRIAL APPENDAGE;  Surgeon: Shyrl Linnie KIDD, MD;  [facility];  Service: Open Heart Surgery;  Laterality: N/A;   COLONOSCOPY     about 10 years. [place]     CORONARY ARTERY BYPASS GRAFT N/A 02/26/2023   Procedure: CORONARY ARTERY BYPASS GRAFTING (CABG) X 4 WITH LEFT INTERNAL MAMMARY ARTERY HARVEST AND RIGHT GREATER SAPHENOUS VEIN HARVESTED ENDOSCOPICALLY;  Surgeon: Shyrl Linnie KIDD, MD;  [facility];  Service: Open  Heart Surgery;  Laterality: N/A;   ICD IMPLANT  2012   PROSTATECTOMY     RIGHT/LEFT HEART CATH AND CORONARY ANGIOGRAPHY N/A 02/12/2023   Procedure: RIGHT/LEFT HEART CATH AND CORONARY ANGIOGRAPHY;  Surgeon: Verlin Lonni BIRCH, MD;  [facility];  Service: Cardiovascular;  Laterality: N/A;   TEE WITHOUT CARDIOVERSION N/A 02/26/2023   Procedure: TRANSESOPHAGEAL ECHOCARDIOGRAM;  Surgeon: Shyrl Linnie KIDD, MD;  [facility];  Service: Open Heart Surgery;  Laterality: N/A;    Allergies:  Allergies  Allergen Reactions   Lisinopril     Extreme diarrhea    Family History:  Family History  Problem Relation Age of Onset   Lung cancer Mother    Uterine cancer Mother    Lung cancer Father    Atrial fibrillation Sister    Lung cancer Brother    Colon cancer Neg Hx    Esophageal cancer Neg Hx    Pancreatic cancer Neg Hx    Liver disease Neg Hx    Stomach cancer Neg Hx    Rectal cancer Neg Hx     Social History:  Social History   Tobacco Use   Smoking status: Former    Current packs/day: 0.00    Types: Cigarettes    Quit date: 09/12/1983    Years since quitting: 40.5    Passive exposure: Never   Smokeless tobacco: Never  Vaping Use   Vaping status: Never Used  Substance Use Topics   Alcohol use: Yes    Comment: very rare   Drug use: No    ROS: Constitutional:  Negative for fever, chills, weight loss CV: Negative for chest pain, previous MI, hypertension Respiratory:  Negative for shortness of breath, wheezing, sleep apnea, frequent cough GI:  Negative for nausea, vomiting, bloody stool, GERD   Physical exam: There were no vitals taken for this visit. GENERAL APPEARANCE:  Well appearing, well developed, well nourished, NAD HEENT:  Atraumatic, normocephalic, oropharynx clear NECK:  Supple without lymphadenopathy or thyromegaly ABDOMEN:  Soft, non-tender, no masses EXTREMITIES:  Moves all extremities well, without clubbing, cyanosis, or  edema NEUROLOGIC:  Alert and oriented x 3, normal gait, CN II-XII grossly intact MENTAL STATUS:  appropriate BACK:  Non-tender to palpation, No CVAT SKIN:  Warm, dry, and intact  Results: U/A:

## 2024-03-20 ENCOUNTER — Encounter: Payer: Self-pay | Admitting: Urology

## 2024-03-20 ENCOUNTER — Ambulatory Visit (INDEPENDENT_AMBULATORY_CARE_PROVIDER_SITE_OTHER): Admitting: Urology

## 2024-03-20 VITALS — BP 159/65 | HR 50 | Ht 72.0 in | Wt 245.0 lb

## 2024-03-20 DIAGNOSIS — N200 Calculus of kidney: Secondary | ICD-10-CM | POA: Diagnosis not present

## 2024-03-20 DIAGNOSIS — N281 Cyst of kidney, acquired: Secondary | ICD-10-CM | POA: Diagnosis not present

## 2024-03-20 DIAGNOSIS — Z87898 Personal history of other specified conditions: Secondary | ICD-10-CM | POA: Diagnosis not present

## 2024-03-20 DIAGNOSIS — Z8546 Personal history of malignant neoplasm of prostate: Secondary | ICD-10-CM | POA: Diagnosis not present

## 2024-03-20 DIAGNOSIS — N3945 Continuous leakage: Secondary | ICD-10-CM | POA: Diagnosis not present

## 2024-03-20 DIAGNOSIS — R310 Gross hematuria: Secondary | ICD-10-CM

## 2024-03-20 LAB — URINALYSIS, ROUTINE W REFLEX MICROSCOPIC
Bilirubin, UA: NEGATIVE
Glucose, UA: NEGATIVE
Ketones, UA: NEGATIVE
Leukocytes,UA: NEGATIVE
Nitrite, UA: NEGATIVE
Specific Gravity, UA: 1.025 (ref 1.005–1.030)
Urobilinogen, Ur: 0.2 mg/dL (ref 0.2–1.0)
pH, UA: 6 (ref 5.0–7.5)

## 2024-03-20 LAB — MICROSCOPIC EXAMINATION
Bacteria, UA: NONE SEEN
RBC, Urine: >30 /HPF — AB (ref 0–2)

## 2024-03-25 ENCOUNTER — Encounter: Payer: Self-pay | Admitting: Family Medicine

## 2024-04-02 ENCOUNTER — Encounter: Payer: Self-pay | Admitting: Cardiology

## 2024-04-02 ENCOUNTER — Other Ambulatory Visit (HOSPITAL_COMMUNITY): Payer: Self-pay

## 2024-04-02 ENCOUNTER — Telehealth: Payer: Self-pay | Admitting: Pharmacy Technician

## 2024-04-02 NOTE — Telephone Encounter (Signed)
 From staff messages   Pharmacy Patient Advocate Encounter   Received notification from staff that prior authorization for entresto  24/26mg  is required/requested.   Insurance verification completed.   The patient is insured through [facility] .   Per test claim: PA required; PA submitted to above mentioned insurance via latent Key/confirmation #/EOC [id number] Status is pending

## 2024-04-02 NOTE — Telephone Encounter (Signed)
 Pharmacy Patient Advocate Encounter  Received notification from [employer] that Prior Authorization for entresto  24/26mg  has been APPROVED from 04/02/24 to 04/02/25   PA #/Case ID/Reference #:

## 2024-04-28 ENCOUNTER — Other Ambulatory Visit: Payer: Self-pay | Admitting: Cardiology

## 2024-04-28 DIAGNOSIS — I10 Essential (primary) hypertension: Secondary | ICD-10-CM

## 2024-04-30 MED ORDER — CARVEDILOL 25 MG PO TABS: ORAL_TABLET | ORAL | 0 refills | Status: DC

## 2024-05-09 ENCOUNTER — Other Ambulatory Visit: Payer: Self-pay | Admitting: Cardiology

## 2024-05-09 MED ORDER — AMIODARONE HCL 200 MG PO TABS: ORAL_TABLET | ORAL | 1 refills | Status: DC

## 2024-05-16 ENCOUNTER — Other Ambulatory Visit: Payer: Self-pay | Admitting: Cardiology

## 2024-05-16 DIAGNOSIS — I5042 Chronic combined systolic (congestive) and diastolic (congestive) heart failure: Secondary | ICD-10-CM

## 2024-05-26 ENCOUNTER — Other Ambulatory Visit: Payer: Self-pay | Admitting: Cardiology

## 2024-05-26 DIAGNOSIS — I480 Paroxysmal atrial fibrillation: Secondary | ICD-10-CM

## 2024-05-26 NOTE — Telephone Encounter (Signed)
 Eliquis  5mg  refill request received. Patient is 78 years old, weight-111.1kg, Crea-1.1 on 03/17/24, Diagnosis-Afib, and last seen by Dr. Pietro on 08/01/23. Dose is appropriate based on dosing criteria. Will send in refill to requested pharmacy.

## 2024-06-09 ENCOUNTER — Ambulatory Visit (INDEPENDENT_AMBULATORY_CARE_PROVIDER_SITE_OTHER): Payer: Medicare Other

## 2024-06-09 DIAGNOSIS — E1122 Type 2 diabetes mellitus with diabetic chronic kidney disease: Secondary | ICD-10-CM | POA: Diagnosis not present

## 2024-06-09 DIAGNOSIS — E559 Vitamin D deficiency, unspecified: Secondary | ICD-10-CM | POA: Diagnosis not present

## 2024-06-09 DIAGNOSIS — I480 Paroxysmal atrial fibrillation: Secondary | ICD-10-CM | POA: Diagnosis not present

## 2024-06-09 LAB — CUP PACEART REMOTE DEVICE CHECK
Battery Remaining Longevity: 78 mo
Battery Remaining Percentage: 72 %
Brady Statistic RV Percent Paced: 35 %
Date Time Interrogation Session: 20250929065200
HighPow Impedance: 45 Ohm
Implantable Lead Connection Status: 753985
Implantable Lead Implant Date: 20160308
Implantable Lead Location: 753860
Implantable Lead Model: 295
Implantable Lead Serial Number: 363165
Implantable Pulse Generator Implant Date: 20160308
Lead Channel Impedance Value: 361 Ohm
Lead Channel Pacing Threshold Amplitude: 0.6 V
Lead Channel Pacing Threshold Pulse Width: 0.5 ms
Lead Channel Setting Pacing Amplitude: 2.4 V
Lead Channel Setting Pacing Pulse Width: 0.5 ms
Lead Channel Setting Sensing Sensitivity: 0.6 mV
Pulse Gen Serial Number: 200269

## 2024-06-10 ENCOUNTER — Ambulatory Visit: Payer: Self-pay | Admitting: Cardiology

## 2024-06-11 NOTE — Progress Notes (Signed)
 Remote ICD Transmission

## 2024-06-18 DIAGNOSIS — N200 Calculus of kidney: Secondary | ICD-10-CM | POA: Diagnosis not present

## 2024-06-18 DIAGNOSIS — R809 Proteinuria, unspecified: Secondary | ICD-10-CM | POA: Diagnosis not present

## 2024-06-18 DIAGNOSIS — E1122 Type 2 diabetes mellitus with diabetic chronic kidney disease: Secondary | ICD-10-CM | POA: Diagnosis not present

## 2024-06-18 DIAGNOSIS — N189 Chronic kidney disease, unspecified: Secondary | ICD-10-CM | POA: Diagnosis not present

## 2024-06-18 DIAGNOSIS — I129 Hypertensive chronic kidney disease with stage 1 through stage 4 chronic kidney disease, or unspecified chronic kidney disease: Secondary | ICD-10-CM | POA: Diagnosis not present

## 2024-06-21 NOTE — Progress Notes (Addendum)
 Designer, [employer] at [employer] [address] [place], [place] [address] [phone]  Date:  06/26/2024   Name:  Bobby Bradley   DOB:  04-Feb-1946   MRN:  [id number]  PCP:  Watt Harlene BROCKS, MD    Chief Complaint: Follow-up (Flu shot /I'm not seeing a kidney specialist, there was an issue with my kidney function. )   History of Present Illness:  Bobby Bradley is a 78 y.o. very pleasant male patient who presents with the following:  Patient seen today for routine follow-up.  I saw him most recently in January at which point he was having gross hematuria and I referred him to urology.  His workup was benign, he last saw Dr. Roseann in July History of well-controlled diabetes,  history of CABG/cardiomyopathy with ICD, TIA, A. fib on anticoagulation Eliquis , CHF, ICD in place, hypertension, prostate cancer s/p prostatectomy, proteinuria  which is now also followed by nephrology  - Foot exam - Update labs, A1c - Flu shot-recommend COVID booster -Recommend RSV Pneumonia, Shingrix up to date  Amiodarone  Eliquis  Aspirin  81 Carvedilol  Metformin  Crestor  Entresto   Lab Results  Component Value Date   HGBA1C 6.4 10/04/2023   Discussed the use of AI scribe software for clinical note transcription with the patient, who gave verbal consent to proceed.  History of Present Illness Bobby Bradley is a 78 year old male with coronary artery disease and diabetes who presents with increased fatigue and leg discomfort.  He experiences increased fatigue, particularly with exertion, such as mowing the yard. It takes him half a day to complete the task due to frequent rest breaks. This fatigue has been present since before his open-heart surgery and has remained stable over time.  He experiences shortness of breath and significant leg fatigue during physical activity. His legs feel 'exhausted' after exertion, similar to having run a long  distance. No cramping or pain in the legs, but they feel very tired consistently after a certain amount of activity. Resting will resolve the sx   He has a history of coronary artery disease and underwent bypass surgery in June of the previous year. He also has a defibrillator. The surgery improved his condition, although the fatigue persists.  No recent blood in the urine, which was a previous concern. He is currently under the care of a nephrologist for proteinuria. He reports numbness in his feet, which he initially attributed to past ankle injuries. He now questions if this is related to his diabetes, as he experiences no pain, only numbness.  His current medications include metformin , taken twice daily, and Entresto , which he is also taking twice daily. He uses a pharmacy at [facility] for his prescriptions.    Patient Active Problem List   Diagnosis Date Noted   Renal cyst, left 03/20/2024   Nephrolithiasis 12/20/2023   Gross hematuria 10/12/2023   Urinary incontinence with continuous leakage 10/12/2023   S/P CABG x 4 02/26/2023   Bronchitis 08/17/2018   Controlled type 2 diabetes mellitus without complication, without long-term current use of insulin  (HCC) 02/06/2018   Cardiomyopathy (HCC) 04/16/2017   History of TIA (transient ischemic attack) 04/16/2017   Chronic anticoagulation 04/16/2017   Paroxysmal atrial fibrillation (HCC) 10/11/2016   Chronic systolic congestive heart failure (HCC) 10/11/2016   ICD (implantable cardioverter-defibrillator) in place 10/11/2016   History of prostate cancer 10/11/2016   Essential hypertension 10/11/2016    Past Medical History:  Diagnosis Date   A-fib (  HCC)    AICD (automatic cardioverter/defibrillator) present    CHF (congestive heart failure) (HCC)    Colon polyps    Diabetes mellitus without complication (HCC)    Dysrhythmia    Essential hypertension    Hyperlipidemia    Non-ischemic cardiomyopathy (HCC)    Presence of combination  internal cardiac defibrillator (ICD) and pacemaker    Presence of permanent cardiac pacemaker    Prostate cancer (HCC)    Stroke (HCC)    TIA- 1994   TIA (transient ischemic attack)     Past Surgical History:  Procedure Laterality Date   BACK SURGERY     CARDIAC CATHETERIZATION     CARDIOVERSION N/A 03/29/2023   Procedure: CARDIOVERSION;  Surgeon: Pietro Redell RAMAN, MD;  [facility];  Service: Cardiovascular;  Laterality: N/A;   CLIPPING OF ATRIAL APPENDAGE N/A 02/26/2023   Procedure: CLIPPING OF LEFT ATRIAL APPENDAGE;  Surgeon: Shyrl Linnie KIDD, MD;  Location: [facility];  Service: Open Heart Surgery;  Laterality: N/A;   COLONOSCOPY     about 10 years[place]     CORONARY ARTERY BYPASS GRAFT N/A 02/26/2023   Procedure: CORONARY ARTERY BYPASS GRAFTING (CABG) X 4 WITH LEFT INTERNAL MAMMARY ARTERY HARVEST AND RIGHT GREATER SAPHENOUS VEIN HARVESTED ENDOSCOPICALLY;  Surgeon: Shyrl Linnie KIDD, MD;  Location: [facility];  Service: Open Heart Surgery;  Laterality: N/A;   ICD IMPLANT  2012   PROSTATECTOMY     RIGHT/LEFT HEART CATH AND CORONARY ANGIOGRAPHY N/A 02/12/2023   Procedure: RIGHT/LEFT HEART CATH AND CORONARY ANGIOGRAPHY;  Surgeon: Verlin Lonni BIRCH, MD;  [facility];  Service: Cardiovascular;  Laterality: N/A;   TEE WITHOUT CARDIOVERSION N/A 02/26/2023   Procedure: TRANSESOPHAGEAL ECHOCARDIOGRAM;  Surgeon: Shyrl Linnie KIDD, MD;  Location: [facility];  Service: Open Heart Surgery;  Laterality: N/A;    Social History   Tobacco Use   Smoking status: Former    Current packs/day: 0.00    Types: Cigarettes    Quit date: 09/12/1983    Years since quitting: 40.8    Passive exposure: Never   Smokeless tobacco: Never  Vaping Use   Vaping status: Never Used  Substance Use Topics   Alcohol use: Yes    Comment: very rare   Drug use: No    Family History  Problem Relation Age of Onset   Lung cancer Mother     Uterine cancer Mother    Lung cancer Father    Atrial fibrillation Sister    Lung cancer Brother    Colon cancer Neg Hx    Esophageal cancer Neg Hx    Pancreatic cancer Neg Hx    Liver disease Neg Hx    Stomach cancer Neg Hx    Rectal cancer Neg Hx     Allergies  Allergen Reactions   Lisinopril     Extreme diarrhea    Medication list has been reviewed and updated.  Current Outpatient Medications on File Prior to Visit  Medication Sig Dispense Refill   amiodarone  (PACERONE ) 200 MG tablet Take 1 tablet daily 90 tablet 1   apixaban  (ELIQUIS ) 5 MG TABS tablet TAKE 1 TABLET(5 MG) BY MOUTH TWICE DAILY 180 tablet 2   aspirin  EC 81 MG tablet Take 81 mg by mouth daily. Swallow whole.     carvedilol  (COREG ) 25 MG tablet TAKE 1 TABLET(25 MG) BY MOUTH TWICE DAILY 180 tablet 0   cetirizine (ZYRTEC) 10 MG tablet Take 10 mg  by mouth daily.     ferrous sulfate  325 (65 FE) MG tablet Take 1 tablet (325 mg total) by mouth daily with breakfast. 90 tablet 3   metFORMIN  (GLUCOPHAGE ) 500 MG tablet TAKE 1 TABLET(500 MG) BY MOUTH TWICE DAILY WITH A MEAL 180 tablet 3   rosuvastatin  (CRESTOR ) 40 MG tablet TAKE 1 TABLET(40 MG) BY MOUTH DAILY 90 tablet 3   sacubitril -valsartan  (ENTRESTO ) 24-26 MG TAKE 1 TABLET BY MOUTH TWICE DAILY 180 tablet 0   tetrahydrozoline 0.05 % ophthalmic solution Place 1 drop into both eyes every morning.     No current facility-administered medications on file prior to visit.    Review of Systems:  As per HPI- otherwise negative.   Physical Examination: Vitals:   06/26/24 0938  BP: 132/76  Pulse: (!) 50  SpO2: 97%   Vitals:   06/26/24 0938  Weight: 241 lb 12.8 oz (109.7 kg)  Height: 6' (1.829 m)   Body mass index is 32.79 kg/m. Ideal Body Weight: Weight in (lb) to have BMI = 25: 183.9  GEN: no acute distress. Mild obesity, looks well HEENT: Atraumatic, Normocephalic.  Ears and Nose: No external deformity. CV: RRR, No M/G/R. No JVD. No thrill. No extra heart  sounds. PULM: CTA B, no wheezes, crackles, rhonchi. No retractions. No resp. distress. No accessory muscle use. ABD: S, NT, ND, +BS. No rebound. No HSM. EXTR: No c/c/e PSYCH: Normally interactive. Conversant.  Foot exam normal- he has good pedal pulses bilaterally   Assessment and Plan: Controlled type 2 diabetes mellitus without complication, without long-term current use of insulin  (HCC) - Plan: Hemoglobin A1c  Chronic anticoagulation  History of TIA (transient ischemic attack)  S/P CABG x 4  History of prostate cancer - Plan: PSA  Need for influenza vaccination - Plan: Flu vaccine HIGH DOSE PF(Fluzone Trivalent)  Assessment & Plan Possible peripheral artery disease with claudication Symptoms consistent with peripheral artery disease, requiring further evaluation. - Order ankle-brachial index (ABI) to assess arterial circulation in legs. - Encourage continued physical activity as tolerated.  Chronic kidney disease Recent nephrology evaluation shows decline in kidney function from 65 to 40s. Kidney ultrasound planned but not yet scheduled. - Follow up with nephrology for kidney ultrasound scheduling.  Type 2 diabetes mellitus with diabetic neuropathy Diabetes well-controlled with metformin . Numbness in feet likely due to diabetic neuropathy. - Refill metformin  500 mg oral twice daily.  Coronary artery disease, status post coronary artery bypass graft Status post coronary artery bypass graft over a year ago with improved symptoms. Cardiologist follow-up needed. - Refill Entresto  as per cardiology's note. - Instruct to call cardiology for follow-up appointment.  General Health Maintenance Routine health maintenance discussed, including vaccinations and screenings. RSV vaccine needed as a one-time dose. - Check A1c level. - Perform PSA test. - Administer RSV vaccine. - Administer COVID vaccine. - Administer flu vaccine.  Signed Harlene Schroeder, MD  Received labs as  below, message to patient Results for orders placed or performed in visit on 06/26/24  Hemoglobin A1c   Collection Time: 06/26/24 10:14 AM  Result Value Ref Range   Hgb A1c MFr Bld 6.5 4.6 - 6.5 %  PSA   Collection Time: 06/26/24 10:14 AM  Result Value Ref Range   PSA 0.04 (L) 0.10 - 4.00 ng/mL    "

## 2024-06-21 NOTE — Patient Instructions (Incomplete)
 It was great to see you today, I will be in touch with your labs. Recommend a COVID booster, 1 dose of RSV vaccine if not done already- can be given at pharmacy I will be in touch with your labs asap We will set up the ABI testing to check circulation in your legs Please call Dr Pietro and set up a recheck appt!

## 2024-06-26 ENCOUNTER — Encounter: Payer: Self-pay | Admitting: Family Medicine

## 2024-06-26 ENCOUNTER — Encounter: Payer: Self-pay | Admitting: Cardiology

## 2024-06-26 ENCOUNTER — Other Ambulatory Visit (HOSPITAL_BASED_OUTPATIENT_CLINIC_OR_DEPARTMENT_OTHER): Payer: Self-pay

## 2024-06-26 ENCOUNTER — Ambulatory Visit: Admitting: Family Medicine

## 2024-06-26 VITALS — BP 132/76 | HR 50 | Ht 72.0 in | Wt 241.8 lb

## 2024-06-26 DIAGNOSIS — Z951 Presence of aortocoronary bypass graft: Secondary | ICD-10-CM | POA: Diagnosis not present

## 2024-06-26 DIAGNOSIS — Z7901 Long term (current) use of anticoagulants: Secondary | ICD-10-CM | POA: Diagnosis not present

## 2024-06-26 DIAGNOSIS — Z8673 Personal history of transient ischemic attack (TIA), and cerebral infarction without residual deficits: Secondary | ICD-10-CM | POA: Diagnosis not present

## 2024-06-26 DIAGNOSIS — I5042 Chronic combined systolic (congestive) and diastolic (congestive) heart failure: Secondary | ICD-10-CM

## 2024-06-26 DIAGNOSIS — E119 Type 2 diabetes mellitus without complications: Secondary | ICD-10-CM | POA: Diagnosis not present

## 2024-06-26 DIAGNOSIS — Z8546 Personal history of malignant neoplasm of prostate: Secondary | ICD-10-CM | POA: Diagnosis not present

## 2024-06-26 DIAGNOSIS — Z23 Encounter for immunization: Secondary | ICD-10-CM | POA: Diagnosis not present

## 2024-06-26 DIAGNOSIS — I739 Peripheral vascular disease, unspecified: Secondary | ICD-10-CM | POA: Diagnosis not present

## 2024-06-26 DIAGNOSIS — Z7984 Long term (current) use of oral hypoglycemic drugs: Secondary | ICD-10-CM

## 2024-06-26 LAB — PSA: PSA: 0.04 ng/mL — ABNORMAL LOW (ref 0.10–4.00)

## 2024-06-26 LAB — HEMOGLOBIN A1C: Hgb A1c MFr Bld: 6.5 % (ref 4.6–6.5)

## 2024-06-26 MED ORDER — METFORMIN HCL 500 MG PO TABS: 500.0000 mg | ORAL_TABLET | Freq: Two times a day (BID) | ORAL | 3 refills | Status: DC

## 2024-06-26 MED ORDER — COMIRNATY 30 MCG/0.3ML IM SUSY
0.3000 mL | PREFILLED_SYRINGE | Freq: Once | INTRAMUSCULAR | 0 refills | Status: AC
  Filled 2024-06-26: qty 0.3, 1d supply, fill #0

## 2024-06-26 MED ORDER — SACUBITRIL-VALSARTAN 24-26 MG PO TABS: 1.0000 | ORAL_TABLET | Freq: Two times a day (BID) | ORAL | 0 refills | Status: DC

## 2024-06-26 MED ORDER — AREXVY 120 MCG/0.5ML IM SUSR
0.5000 mL | Freq: Once | INTRAMUSCULAR | 0 refills | Status: AC
  Filled 2024-06-26: qty 0.5, 1d supply, fill #0

## 2024-06-27 ENCOUNTER — Other Ambulatory Visit (HOSPITAL_BASED_OUTPATIENT_CLINIC_OR_DEPARTMENT_OTHER): Payer: Self-pay | Admitting: Nephrology

## 2024-06-27 DIAGNOSIS — N200 Calculus of kidney: Secondary | ICD-10-CM

## 2024-06-27 DIAGNOSIS — R809 Proteinuria, unspecified: Secondary | ICD-10-CM

## 2024-06-30 ENCOUNTER — Telehealth: Payer: Self-pay

## 2024-06-30 NOTE — Telephone Encounter (Signed)
-----   Message from [facility] sent at 01/04/2024  1:47 PM EDT ----- Leigh Elspeth SQUIBB, MD to Edmundo Clarita HERO, CMA      01/04/24  1:22 PM Result Note Jan can you please place a recall for CBC and TIBC ferritin in 6 months.  Thanks

## 2024-06-30 NOTE — Telephone Encounter (Signed)
 Labs  are in.  MyChart message to patient to go to the lab

## 2024-07-03 ENCOUNTER — Other Ambulatory Visit (INDEPENDENT_AMBULATORY_CARE_PROVIDER_SITE_OTHER)

## 2024-07-03 ENCOUNTER — Ambulatory Visit: Payer: Self-pay | Admitting: Gastroenterology

## 2024-07-03 DIAGNOSIS — D509 Iron deficiency anemia, unspecified: Secondary | ICD-10-CM | POA: Diagnosis not present

## 2024-07-03 LAB — CBC WITH DIFFERENTIAL/PLATELET
Basophils Absolute: 0.1 K/uL (ref 0.0–0.1)
Basophils Relative: 0.8 % (ref 0.0–3.0)
Eosinophils Absolute: 0.2 K/uL (ref 0.0–0.7)
Eosinophils Relative: 3.1 % (ref 0.0–5.0)
HCT: 41.8 % (ref 39.0–52.0)
Hemoglobin: 13.6 g/dL (ref 13.0–17.0)
Lymphocytes Relative: 16.4 % (ref 12.0–46.0)
Lymphs Abs: 1 K/uL (ref 0.7–4.0)
MCHC: 32.5 g/dL (ref 30.0–36.0)
MCV: 90.3 fl (ref 78.0–100.0)
Monocytes Absolute: 0.5 K/uL (ref 0.1–1.0)
Monocytes Relative: 7.4 % (ref 3.0–12.0)
Neutro Abs: 4.6 K/uL (ref 1.4–7.7)
Neutrophils Relative %: 72.3 % (ref 43.0–77.0)
Platelets: 181 K/uL (ref 150.0–400.0)
RBC: 4.63 Mil/uL (ref 4.22–5.81)
RDW: 15.7 % — ABNORMAL HIGH (ref 11.5–15.5)
WBC: 6.3 K/uL (ref 4.0–10.5)

## 2024-07-03 LAB — IBC + FERRITIN
Ferritin: 26.1 ng/mL (ref 22.0–322.0)
Iron: 70 ug/dL (ref 42–165)
Saturation Ratios: 18.7 % — ABNORMAL LOW (ref 20.0–50.0)
TIBC: 373.8 ug/dL (ref 250.0–450.0)
Transferrin: 267 mg/dL (ref 212.0–360.0)

## 2024-07-03 NOTE — Telephone Encounter (Signed)
 Added to OV waitlist for January

## 2024-07-04 ENCOUNTER — Ambulatory Visit (HOSPITAL_BASED_OUTPATIENT_CLINIC_OR_DEPARTMENT_OTHER)
Admission: RE | Admit: 2024-07-04 | Discharge: 2024-07-04 | Disposition: A | Discharge status: Home / Self Care | Source: Ambulatory Visit | Attending: Nephrology | Admitting: Nephrology

## 2024-07-04 DIAGNOSIS — N200 Calculus of kidney: Secondary | ICD-10-CM | POA: Insufficient documentation

## 2024-07-04 DIAGNOSIS — R809 Proteinuria, unspecified: Secondary | ICD-10-CM | POA: Insufficient documentation

## 2024-07-04 DIAGNOSIS — N281 Cyst of kidney, acquired: Secondary | ICD-10-CM | POA: Diagnosis not present

## 2024-07-09 ENCOUNTER — Encounter: Payer: Self-pay | Admitting: Family Medicine

## 2024-07-09 ENCOUNTER — Ambulatory Visit (HOSPITAL_COMMUNITY)
Admission: RE | Admit: 2024-07-09 | Discharge: 2024-07-09 | Disposition: A | Discharge status: Home / Self Care | Source: Ambulatory Visit | Attending: Family Medicine | Admitting: Family Medicine

## 2024-07-09 DIAGNOSIS — I739 Peripheral vascular disease, unspecified: Secondary | ICD-10-CM | POA: Insufficient documentation

## 2024-07-10 LAB — VAS US ABI WITH/WO TBI
Left ABI: 1.33
Right ABI: 1.31

## 2024-07-14 DIAGNOSIS — I129 Hypertensive chronic kidney disease with stage 1 through stage 4 chronic kidney disease, or unspecified chronic kidney disease: Secondary | ICD-10-CM | POA: Diagnosis not present

## 2024-07-23 DIAGNOSIS — N189 Chronic kidney disease, unspecified: Secondary | ICD-10-CM | POA: Diagnosis not present

## 2024-07-23 DIAGNOSIS — N200 Calculus of kidney: Secondary | ICD-10-CM | POA: Diagnosis not present

## 2024-07-23 DIAGNOSIS — R809 Proteinuria, unspecified: Secondary | ICD-10-CM | POA: Diagnosis not present

## 2024-07-23 DIAGNOSIS — E1122 Type 2 diabetes mellitus with diabetic chronic kidney disease: Secondary | ICD-10-CM | POA: Diagnosis not present

## 2024-07-23 DIAGNOSIS — I129 Hypertensive chronic kidney disease with stage 1 through stage 4 chronic kidney disease, or unspecified chronic kidney disease: Secondary | ICD-10-CM | POA: Diagnosis not present

## 2024-07-25 ENCOUNTER — Other Ambulatory Visit: Payer: Self-pay | Admitting: Cardiology

## 2024-07-25 DIAGNOSIS — I10 Essential (primary) hypertension: Secondary | ICD-10-CM

## 2024-07-28 ENCOUNTER — Other Ambulatory Visit: Payer: Self-pay | Admitting: Cardiology

## 2024-07-28 DIAGNOSIS — I10 Essential (primary) hypertension: Secondary | ICD-10-CM

## 2024-07-28 MED ORDER — CARVEDILOL 25 MG PO TABS: 25.0000 mg | ORAL_TABLET | Freq: Two times a day (BID) | ORAL | 0 refills | Status: DC

## 2024-07-30 ENCOUNTER — Encounter: Payer: Self-pay | Admitting: Cardiology

## 2024-07-30 NOTE — Progress Notes (Signed)
 HPI: FU CAD, atrial fibrillation and CHF. Also with prior ICD. Previously moved from [place].  Has history of paroxysmal atrial fibrillation and TIA.  Abdominal ultrasound February 2018 showed no aneurysm. PET scan Jan 31, 2023 showed large reversible defect in the apical inferior, inferolateral, anterolateral segment with ejection fraction dropping from 32% to 29% with exercise and study felt to be high risk.  Cardiac catheterization June 2024 showed severe three-vessel coronary artery disease.  Carotid Dopplers June 2024 showed no significant stenosis.  Patient had coronary artery bypass and graft on February 26, 2023 with a LIMA to the LAD, saphenous vein graft to the diagonal, saphenous vein graft to the obtuse marginal and saphenous vein graft to the PDA.  Postop course complicated by atrial fibrillation and he was placed on amiodarone .  He was also started on apixaban .  Patient subsequently underwent cardioversion to sinus rhythm.  Echocardiogram November 2024 showed ejection fraction 45 to 50%, grade 2 diastolic dysfunction, mild left atrial enlargement.  ABIs October 2025 normal.  Since last seen he denies significant chest pain.  He has some dyspnea with more vigorous activities.  He also has fatigue with activities relieved with 2 to 3 minutes of rest.  Some pain in his hips.  No syncope or bleeding.  Current Outpatient Medications  Medication Sig Dispense Refill   amiodarone  (PACERONE ) 200 MG tablet Take 1 tablet daily 90 tablet 1   apixaban  (ELIQUIS ) 5 MG TABS tablet TAKE 1 TABLET(5 MG) BY MOUTH TWICE DAILY 180 tablet 2   aspirin  EC 81 MG tablet Take 81 mg by mouth daily. Swallow whole.     carvedilol  (COREG ) 25 MG tablet TAKE 1 TABLET(25 MG) BY MOUTH TWICE DAILY 180 tablet 0   cetirizine (ZYRTEC) 10 MG tablet Take 10 mg by mouth daily.     ferrous sulfate  325 (65 FE) MG tablet Take 1 tablet (325 mg total) by mouth daily with breakfast. 90 tablet 3   JARDIANCE 10 MG TABS tablet Take 10  mg by mouth daily.     metFORMIN  (GLUCOPHAGE ) 500 MG tablet Take 1 tablet (500 mg total) by mouth 2 (two) times daily with a meal. 180 tablet 3   rosuvastatin  (CRESTOR ) 40 MG tablet TAKE 1 TABLET(40 MG) BY MOUTH DAILY 90 tablet 3   sacubitril -valsartan  (ENTRESTO ) 24-26 MG Take 1 tablet by mouth 2 (two) times daily. 180 tablet 0   tetrahydrozoline 0.05 % ophthalmic solution Place 1 drop into both eyes every morning.     No current facility-administered medications for this visit.     Past Medical History:  Diagnosis Date   A-fib [facility])    AICD (automatic cardioverter/defibrillator) present    CHF (congestive heart failure) (HCC)    Colon polyps    Diabetes mellitus without complication (HCC)    Dysrhythmia    Essential hypertension    Hyperlipidemia    Non-ischemic cardiomyopathy (HCC)    Presence of combination internal cardiac defibrillator (ICD) and pacemaker    Presence of permanent cardiac pacemaker    Prostate cancer (HCC)    Stroke (HCC)    TIA- 1994   TIA (transient ischemic attack)     Past Surgical History:  Procedure Laterality Date   BACK SURGERY     CARDIAC CATHETERIZATION     CARDIOVERSION N/A 03/29/2023   Procedure: CARDIOVERSION;  Surgeon: Pietro Redell RAMAN, MD;  [facility];  Service: Cardiovascular;  Laterality: N/A;   CLIPPING OF ATRIAL APPENDAGE N/A 02/26/2023  Procedure: CLIPPING OF LEFT ATRIAL APPENDAGE;  Surgeon: Shyrl Linnie KIDD, MD;  Location: [facility];  Service: Open Heart Surgery;  Laterality: N/A;   COLONOSCOPY     about 10 years[place]     CORONARY ARTERY BYPASS GRAFT N/A 02/26/2023   Procedure: CORONARY ARTERY BYPASS GRAFTING (CABG) X 4 WITH LEFT INTERNAL MAMMARY ARTERY HARVEST AND RIGHT GREATER SAPHENOUS VEIN HARVESTED ENDOSCOPICALLY;  Surgeon: Shyrl Linnie KIDD, MD;  Location: [facility];  Service: Open Heart Surgery;  Laterality: N/A;   ICD IMPLANT  2012   PROSTATECTOMY     RIGHT/LEFT HEART  CATH AND CORONARY ANGIOGRAPHY N/A 02/12/2023   Procedure: RIGHT/LEFT HEART CATH AND CORONARY ANGIOGRAPHY;  Surgeon: Verlin Lonni BIRCH, MD;  [facility];  Service: Cardiovascular;  Laterality: N/A;   TEE WITHOUT CARDIOVERSION N/A 02/26/2023   Procedure: TRANSESOPHAGEAL ECHOCARDIOGRAM;  Surgeon: Shyrl Linnie KIDD, MD;  Location: [facility];  Service: Open Heart Surgery;  Laterality: N/A;    Social History   Socioeconomic History   Marital status: Married    Spouse name: Not on file   Number of children: 7   Years of education: Not on file   Highest education level: Master's degree (e.g., MA, MS, MEng, MEd, MSW, MBA)  Occupational History   Occupation: Print production planner   Tobacco Use   Smoking status: Former    Current packs/day: 0.00    Types: Cigarettes    Quit date: 09/12/1983    Years since quitting: 40.9    Passive exposure: Never   Smokeless tobacco: Never  Vaping Use   Vaping status: Never Used  Substance and Sexual Activity   Alcohol use: Yes    Comment: very rare   Drug use: No   Sexual activity: Not Currently  Other Topics Concern   Not on file  Social History Narrative   Not on file   Social Drivers of Health   Financial Resource Strain: Low Risk  (06/19/2024)   Overall Financial Resource Strain (CARDIA)    Difficulty of Paying Living Expenses: Not hard at all  Food Insecurity: No Food Insecurity (06/19/2024)   Hunger Vital Sign    Worried About Running Out of Food in the Last Year: Never true    Ran Out of Food in the Last Year: Never true  Transportation Needs: No Transportation Needs (06/19/2024)   PRAPARE - Administrator, Civil Service (Medical): No    Lack of Transportation (Non-Medical): No  Physical Activity: Sufficiently Active (06/19/2024)   Exercise Vital Sign    Days of Exercise per Week: 4 days    Minutes of Exercise per Session: 60 min  Stress: No Stress Concern Present (06/19/2024)   [employer] of  Occupational Health - Occupational Stress Questionnaire    Feeling of Stress: Not at all  Social Connections: Moderately Isolated (06/19/2024)   Social Connection and Isolation Panel    Frequency of Communication with Friends and Family: More than three times a week    Frequency of Social Gatherings with Friends and Family: Three times a week    Attends Religious Services: Never    Active Member of Clubs or Organizations: No    Attends Banker Meetings: Not on file    Marital Status: Married  Intimate Partner Violence: Not At Risk (08/14/2023)   Humiliation, Afraid, Rape, and Kick questionnaire    Fear of Current or Ex-Partner: No    Emotionally Abused: No  Physically Abused: No    Sexually Abused: No    Family History  Problem Relation Age of Onset   Lung cancer Mother    Uterine cancer Mother    Lung cancer Father    Atrial fibrillation Sister    Lung cancer Brother    Colon cancer Neg Hx    Esophageal cancer Neg Hx    Pancreatic cancer Neg Hx    Liver disease Neg Hx    Stomach cancer Neg Hx    Rectal cancer Neg Hx     ROS: no fevers or chills, productive cough, hemoptysis, dysphasia, odynophagia, melena, hematochezia, dysuria, hematuria, rash, seizure activity, orthopnea, PND, pedal edema, claudication. Remaining systems are negative.  Physical Exam: Well-developed well-nourished in no acute distress.  Skin is warm and dry.  HEENT is normal.  Neck is supple.  Chest is clear to auscultation with normal expansion.  Cardiovascular exam is regular rate and rhythm.  Abdominal exam nontender or distended. No masses palpated. Extremities show no edema. neuro grossly intact  EKG Interpretation Date/Time:  Wednesday August 06 2024 10:55:38 EST Ventricular Rate:  50 PR Interval:    QRS Duration:  202 QT Interval:  584 QTC Calculation: 532 R Axis:   269  Text Interpretation: Ventricular-paced rhythm Confirmed by Pietro Rogue ([id number]) on 08/06/2024  11:02:41 AM    A/P  1 coronary artery disease status post coronary bypass and graft-patient denies chest pain.  Continue aspirin  and statin.  2 paroxysmal atrial fibrillation/flutter-unclear if patient atrial fibrillation today as she will has a paced rhythm.  He would like to come off of amiodarone  if possible.  He did have some atrial fibrillation in the past prior to his coronary bypass and graft and understands that he will likely develop atrial fibrillation off of amiodarone .  However he had his device interrogated and as he has not had atrial fibrillation we will discontinue amiodarone  at follow-up.  Will continue carvedilol  and apixaban .    3 hypertension-patient's blood pressure is controlled.  Continue present medications.  4 hyperlipidemia-continue statin.  5 ischemic cardiomyopathy-mild reduction in LV function on most recent echocardiogram.  Continue Entresto  and carvedilol .  6 ICD-per EP.  7 weakness with activities-etiology unclear.  Recent ABIs normal.  Question component of deconditioning.  I have asked him to increase his activities to see if his symptoms improve.  Rogue Pietro, MD

## 2024-08-06 ENCOUNTER — Ambulatory Visit: Attending: Cardiology | Admitting: Cardiology

## 2024-08-06 ENCOUNTER — Encounter: Payer: Self-pay | Admitting: Cardiology

## 2024-08-06 VITALS — BP 95/54 | HR 50 | Ht 72.0 in | Wt 247.0 lb

## 2024-08-06 DIAGNOSIS — Z95810 Presence of automatic (implantable) cardiac defibrillator: Secondary | ICD-10-CM | POA: Diagnosis not present

## 2024-08-06 DIAGNOSIS — I480 Paroxysmal atrial fibrillation: Secondary | ICD-10-CM | POA: Diagnosis not present

## 2024-08-06 DIAGNOSIS — I10 Essential (primary) hypertension: Secondary | ICD-10-CM | POA: Insufficient documentation

## 2024-08-06 DIAGNOSIS — E785 Hyperlipidemia, unspecified: Secondary | ICD-10-CM | POA: Insufficient documentation

## 2024-08-06 DIAGNOSIS — I2510 Atherosclerotic heart disease of native coronary artery without angina pectoris: Secondary | ICD-10-CM | POA: Insufficient documentation

## 2024-08-06 NOTE — Patient Instructions (Signed)

## 2024-08-15 ENCOUNTER — Other Ambulatory Visit: Payer: Self-pay | Admitting: Cardiology

## 2024-08-15 DIAGNOSIS — I5042 Chronic combined systolic (congestive) and diastolic (congestive) heart failure: Secondary | ICD-10-CM

## 2024-08-19 ENCOUNTER — Ambulatory Visit: Payer: Medicare Other

## 2024-08-19 ENCOUNTER — Other Ambulatory Visit: Payer: Self-pay | Admitting: Cardiology

## 2024-08-19 VITALS — Ht 72.0 in | Wt 247.0 lb

## 2024-08-19 DIAGNOSIS — Z0000 Encounter for general adult medical examination without abnormal findings: Secondary | ICD-10-CM | POA: Diagnosis not present

## 2024-08-19 DIAGNOSIS — I5042 Chronic combined systolic (congestive) and diastolic (congestive) heart failure: Secondary | ICD-10-CM

## 2024-08-19 NOTE — Progress Notes (Unsigned)
  Electrophysiology Office Note:   Date:  08/19/2024  ID:  Bobby Bradley, DOB 1946/01/12, MRN [id number]  Primary Cardiologist: Redell Shallow, MD Primary Heart Failure: None Electrophysiologist: Lilyanah Celestin Gladis Norton, MD  {Click to update primary MD,subspecialty MD or APP then REFRESH:1}    History of Present Illness:   Bobby Bradley is a 78 y.o. male with h/o chronic systolic heart failure, atrial fibrillation, hypertension, hyperlipidemia, TIA seen today for routine electrophysiology followup.   Since last being seen in our clinic the patient reports doing ***.  he denies chest pain, palpitations, dyspnea, PND, orthopnea, nausea, vomiting, dizziness, syncope, edema, weight gain, or early satiety.   Review of systems complete and found to be negative unless listed in HPI.      EP Information / Studies Reviewed:    {EKGtoday:28818}      ICD Interrogation-  reviewed in detail today,  See PACEART report.  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  If not, click here to update score.  REFRESH note.  :1} This indicates a  % annual risk of stroke. The patient's score is based upon:    {This patient has a significant risk of stroke if diagnosed with atrial fibrillation.  Please consider VKA or DOAC agent for anticoagulation if the bleeding risk is acceptable.   You can also use the SmartPhrase .[email or web address] for documentation.   :[id number]}    STOP-Bang Score:     { Consider Dx Sleep Disordered Breathing or Sleep Apnea  ICD G47.33          :1}     Physical Exam:   VS:  There were no vitals taken for this visit.   Wt Readings from Last 3 Encounters:  08/19/24 247 lb (112 kg)  08/06/24 247 lb (112 kg)  06/26/24 241 lb 12.8 oz (109.7 kg)     GEN: Well nourished,  well developed in no acute distress NECK: No JVD; No carotid bruits CARDIAC: {EPRHYTHM:28826}, no murmurs, rubs, gallops RESPIRATORY:  Clear to auscultation without rales, wheezing or rhonchi  ABDOMEN: Soft, non-tender, non-distended EXTREMITIES:  No edema; No deformity   ASSESSMENT AND PLAN:    Chronic systolic dysfunction s/p [employer] single chamber ICD  euvolemic today Stable on an appropriate medical regimen Normal ICD function See Pace Art report No changes today  2.  Paroxysmal atrial fibrillation: Was previously on amiodarone  for atrial fibrillation.  Atrial fibrillation recurred after cardiac surgery.  Amiodarone  has since been stopped.***  3.  Secondary hypercoagulable state: On Eliquis   4.  Hypertension:***  5.  Coronary artery disease: Post CABG times 11/15/1722.  No current chest pain.  Plan per primary cardiology.  Disposition:   Follow up with {EPPROVIDERS:28135::EP Team} {EPFOLLOW LE:71826}   Signed, Oakes Mccready Gladis Norton, MD

## 2024-08-19 NOTE — Progress Notes (Signed)
 Chief Complaint  Patient presents with   Medicare Wellness     Subjective:   Bobby Bradley is a 78 y.o. male who presents for a Medicare Annual Wellness Visit.  Visit info / Clinical Intake: Medicare Wellness Visit Type:: Subsequent Annual Wellness Visit Persons participating in visit and providing information:: patient Medicare Wellness Visit Mode:: Telephone If telephone:: video declined Since this visit was completed virtually, some vitals may be partially provided or unavailable. Missing vitals are due to the limitations of the virtual format.: Documented vitals are patient reported If Telephone or Video please confirm:: I connected with patient using audio/video enable telemedicine. I verified patient identity with two identifiers, discussed telehealth limitations, and patient agreed to proceed. Patient Location:: Home Provider Location:: Office Interpreter Needed?: No Pre-visit prep was completed: yes AWV questionnaire completed by patient prior to visit?: yes Date:: 08/13/24 Living arrangements:: lives with spouse/significant other Patient's Overall Health Status Rating: good Typical amount of pain: none Does pain affect daily life?: no Are you currently prescribed opioids?: no  Dietary Habits and Nutritional Risks How many meals a day?: 3 Eats fruit and vegetables daily?: yes Most meals are obtained by: preparing own meals In the last 2 weeks, have you had any of the following?: none Diabetic:: (!) yes Any non-healing wounds?: no How often do you check your BS?: continuous glucose monitor Would you like to be referred to a Nutritionist or for Diabetic Management? : no  Functional Status Activities of Daily Living (to include ambulation/medication): Independent Ambulation: Independent with device- listed below Home Assistive Devices/Equipment: Eyeglasses Medication Administration: Independent Home Management (perform basic housework or laundry):  Independent Manage your own finances?: yes Primary transportation is: driving Concerns about vision?: no *vision screening is required for WTM* Concerns about hearing?: no  Fall Screening Falls in the past year?: 0 Number of falls in past year: 0 Was there an injury with Fall?: 0 Fall Risk Category Calculator: 0 Patient Fall Risk Level: Low Fall Risk  Fall Risk Patient at Risk for Falls Due to: No Fall Risks Fall risk Follow up: Falls evaluation completed  Home and Transportation Safety: All rugs have non-skid backing?: yes All stairs or steps have railings?: yes Grab bars in the bathtub or shower?: (!) no Have non-skid surface in bathtub or shower?: yes Good home lighting?: yes Regular seat belt use?: yes Hospital stays in the last year:: no  Cognitive Assessment Difficulty concentrating, remembering, or making decisions? : no Will 6CIT or Mini Cog be Completed: yes What month is it?: 0 points Give patient an address phrase to remember (5 components): [address] [place] About what time is it?: 0 points Count backwards from 20 to 1: 0 points Say the months of the year in reverse: 0 points Repeat the address phrase from earlier: 0 points  Advance Directives (For Healthcare) Does Patient Have a Medical Advance Directive?: No Would patient like information on creating a medical advance directive?: No - Patient declined  Reviewed/Updated  Reviewed/Updated: Reviewed All (Medical, Surgical, Family, Medications, Allergies, Care Teams, Patient Goals)    Allergies (verified) Lisinopril   Current Medications (verified) Outpatient Encounter Medications as of 08/19/2024  Medication Sig   amiodarone  (PACERONE ) 200 MG tablet Take 1 tablet daily   apixaban  (ELIQUIS ) 5 MG TABS tablet TAKE 1 TABLET(5 MG) BY MOUTH TWICE DAILY   aspirin  EC 81 MG tablet Take 81 mg by mouth daily. Swallow whole.   carvedilol  (COREG ) 25 MG tablet TAKE 1 TABLET(25 MG) BY MOUTH TWICE  DAILY    cetirizine (ZYRTEC) 10 MG tablet Take 10 mg by mouth daily.   ferrous sulfate  325 (65 FE) MG tablet Take 1 tablet (325 mg total) by mouth daily with breakfast.   JARDIANCE 10 MG TABS tablet Take 10 mg by mouth daily.   metFORMIN  (GLUCOPHAGE ) 500 MG tablet Take 1 tablet (500 mg total) by mouth 2 (two) times daily with a meal.   rosuvastatin  (CRESTOR ) 40 MG tablet TAKE 1 TABLET(40 MG) BY MOUTH DAILY   sacubitril -valsartan  (ENTRESTO ) 24-26 MG Take 1 tablet by mouth 2 (two) times daily.   tetrahydrozoline 0.05 % ophthalmic solution Place 1 drop into both eyes every morning.   No facility-administered encounter medications on file as of 08/19/2024.    History: Past Medical History:  Diagnosis Date   A-fib (HCC)    AICD (automatic cardioverter/defibrillator) present    CHF (congestive heart failure) (HCC)    Colon polyps    Diabetes mellitus without complication (HCC)    Dysrhythmia    Essential hypertension    Hyperlipidemia    Non-ischemic cardiomyopathy (HCC)    Presence of combination internal cardiac defibrillator (ICD) and pacemaker    Presence of permanent cardiac pacemaker    Prostate cancer (HCC)    Stroke (HCC)    TIA- 1994   TIA (transient ischemic attack)    Past Surgical History:  Procedure Laterality Date   BACK SURGERY     CARDIAC CATHETERIZATION     CARDIOVERSION N/A 03/29/2023   Procedure: CARDIOVERSION;  Surgeon: Pietro Redell RAMAN, MD;  [facility];  Service: Cardiovascular;  Laterality: N/A;   CLIPPING OF ATRIAL APPENDAGE N/A 02/26/2023   Procedure: CLIPPING OF LEFT ATRIAL APPENDAGE;  Surgeon: Shyrl Linnie KIDD, MD;  Location: [facility];  Service: Open Heart Surgery;  Laterality: N/A;   COLONOSCOPY     about 10 years. [place]     CORONARY ARTERY BYPASS GRAFT N/A 02/26/2023   Procedure: CORONARY ARTERY BYPASS GRAFTING (CABG) X 4 WITH LEFT INTERNAL MAMMARY ARTERY HARVEST AND RIGHT GREATER SAPHENOUS VEIN HARVESTED  ENDOSCOPICALLY;  Surgeon: Shyrl Linnie KIDD, MD;  Location: [facility];  Service: Open Heart Surgery;  Laterality: N/A;   ICD IMPLANT  2012   PROSTATECTOMY     RIGHT/LEFT HEART CATH AND CORONARY ANGIOGRAPHY N/A 02/12/2023   Procedure: RIGHT/LEFT HEART CATH AND CORONARY ANGIOGRAPHY;  Surgeon: Verlin Lonni BIRCH, MD;  [facility];  Service: Cardiovascular;  Laterality: N/A;   TEE WITHOUT CARDIOVERSION N/A 02/26/2023   Procedure: TRANSESOPHAGEAL ECHOCARDIOGRAM;  Surgeon: Shyrl Linnie KIDD, MD;  Location: [facility];  Service: Open Heart Surgery;  Laterality: N/A;   Family History  Problem Relation Age of Onset   Lung cancer Mother    Uterine cancer Mother    Lung cancer Father    Atrial fibrillation Sister    Lung cancer Brother    Colon cancer Neg Hx    Esophageal cancer Neg Hx    Pancreatic cancer Neg Hx    Liver disease Neg Hx    Stomach cancer Neg Hx    Rectal cancer Neg Hx    Social History   Occupational History   Occupation: Print production planner   Tobacco Use   Smoking status: Former    Current packs/day: 0.00    Types: Cigarettes    Quit date: 09/12/1983    Years since quitting: 40.9    Passive exposure: Never   Smokeless tobacco: Never  Vaping Use  Vaping status: Never Used  Substance and Sexual Activity   Alcohol use: Yes    Comment: very rare   Drug use: No   Sexual activity: Not Currently   Tobacco Counseling Counseling given: No  SDOH Screenings   Food Insecurity: No Food Insecurity (08/19/2024)  Housing: Low Risk  (08/19/2024)  Transportation Needs: No Transportation Needs (08/19/2024)  Utilities: Not At Risk (08/19/2024)  Alcohol Screen: Low Risk  (08/13/2024)  Depression (PHQ2-9): Low Risk  (08/19/2024)  Financial Resource Strain: Low Risk  (08/13/2024)  Physical Activity: Insufficiently Active (08/19/2024)  Social Connections: Moderately Isolated (08/19/2024)  Stress: No Stress Concern Present (08/19/2024)  Tobacco Use: Medium Risk  (08/19/2024)  Health Literacy: Adequate Health Literacy (08/19/2024)   See flowsheets for full screening details  Depression Screen PHQ 2 & 9 Depression Scale- Over the past 2 weeks, how often have you been bothered by any of the following problems? Little interest or pleasure in doing things: 0 Feeling down, depressed, or hopeless (PHQ Adolescent also includes...irritable): 0 PHQ-2 Total Score: 0     Goals Addressed               This Visit's Progress     Continue physical activity (pt-stated)        Remain active             Objective:    Today's Vitals   08/19/24 1402  Weight: 247 lb (112 kg)  Height: 6' (1.829 m)   Body mass index is 33.5 kg/m.  Hearing/Vision screen Hearing Screening - Comments:: Denies hearing difficulties   Vision Screening - Comments:: Wears rx glasses - up to date with routine eye exams with  Deferred Immunizations and Health Maintenance Health Maintenance  Topic Date Due   Diabetic kidney evaluation - Urine ACR  Never done   OPHTHALMOLOGY EXAM  08/16/2024   HEMOGLOBIN A1C  12/25/2024   COVID-19 Vaccine (7 - Pfizer risk 2025-26 season) 12/25/2024   Diabetic kidney evaluation - eGFR measurement  03/17/2025   FOOT EXAM  06/26/2025   Medicare Annual Wellness (AWV)  08/19/2025   Colonoscopy  10/24/2025   DTaP/Tdap/Td (3 - Td or Tdap) 09/08/2030   Pneumococcal Vaccine: 50+ Years  Completed   Influenza Vaccine  Completed   Hepatitis C Screening  Completed   Zoster Vaccines- Shingrix  Completed   Meningococcal B Vaccine  Aged Out        Assessment/Plan:  This is a routine wellness examination for Bobby Bradley.  Patient Care Team: Copland, Harlene BROCKS, MD as PCP - General (Family Medicine) Inocencio Soyla Lunger, MD as PCP - Electrophysiology (Cardiology) Pietro Redell RAMAN, MD as PCP - Cardiology (Cardiology)  I have personally reviewed and noted the following in the patient's chart:   Medical and social history Use of alcohol, tobacco or  illicit drugs  Current medications and supplements including opioid prescriptions. Functional ability and status Nutritional status Physical activity Advanced directives List of other physicians Hospitalizations, surgeries, and ER visits in previous 12 months Vitals Screenings to include cognitive, depression, and falls Referrals and appointments  No orders of the defined types were placed in this encounter.  In addition, I have reviewed and discussed with patient certain preventive protocols, quality metrics, and best practice recommendations. A written personalized care plan for preventive services as well as general preventive health recommendations were provided to patient.   Bobby LELON Blush, LPN   [id number]   Return in 1 year on 08/26/25  After Visit Summary: (MyChart) Due to  this being a telephonic visit, the after visit summary with patients personalized plan was offered to patient via MyChart   Nurse Notes: Patient has pending appointment with Ophthalmologist.

## 2024-08-19 NOTE — Patient Instructions (Addendum)
 Bobby Bradley,  Thank you for taking the time for your Medicare Wellness Visit. I appreciate your continued commitment to your health goals. Please review the care plan we discussed, and feel free to reach out if I can assist you further.  Please note that Annual Wellness Visits do not include a physical exam. Some assessments may be limited, especially if the visit was conducted virtually. If needed, we may recommend an in-person follow-up with your provider.  Ongoing Care Seeing your primary care provider every 3 to 6 months helps us  monitor your health and provide consistent, personalized care.   Referrals If a referral was made during today's visit and you haven't received any updates within two weeks, please contact the referred provider directly to check on the status.  Recommended Screenings:  Health Maintenance  Topic Date Due   Yearly kidney health urinalysis for diabetes  Never done   Eye exam for diabetics  08/16/2024   Hemoglobin A1C  12/25/2024   COVID-19 Vaccine (7 - Pfizer risk 2025-26 season) 12/25/2024   Yearly kidney function blood test for diabetes  03/17/2025   Complete foot exam   06/26/2025   Medicare Annual Wellness Visit  08/19/2025   Colon Cancer Screening  10/24/2025   DTaP/Tdap/Td vaccine (3 - Td or Tdap) 09/08/2030   Pneumococcal Vaccine for age over 33  Completed   Flu Shot  Completed   Hepatitis C Screening  Completed   Zoster (Shingles) Vaccine  Completed   Meningitis B Vaccine  Aged Out       08/19/2024    2:03 PM  Advanced Directives  Does Patient Have a Medical Advance Directive? No  Would patient like information on creating a medical advance directive? No - Patient declined    Vision: Annual vision screenings are recommended for early detection of glaucoma, cataracts, and diabetic retinopathy. These exams can also reveal signs of chronic conditions such as diabetes and high blood pressure.  Dental: Annual dental screenings help detect early  signs of oral cancer, gum disease, and other conditions linked to overall health, including heart disease and diabetes.  Please see the attached documents for additional preventive care recommendations.

## 2024-08-20 ENCOUNTER — Encounter: Payer: Self-pay | Admitting: Cardiology

## 2024-08-20 ENCOUNTER — Ambulatory Visit: Attending: Cardiology | Admitting: Cardiology

## 2024-08-20 ENCOUNTER — Ambulatory Visit: Payer: Self-pay | Admitting: Cardiology

## 2024-08-20 VITALS — BP 127/70 | HR 100 | Ht 72.0 in | Wt 237.0 lb

## 2024-08-20 DIAGNOSIS — D6869 Other thrombophilia: Secondary | ICD-10-CM | POA: Diagnosis not present

## 2024-08-20 DIAGNOSIS — I5022 Chronic systolic (congestive) heart failure: Secondary | ICD-10-CM | POA: Diagnosis not present

## 2024-08-20 DIAGNOSIS — I10 Essential (primary) hypertension: Secondary | ICD-10-CM | POA: Insufficient documentation

## 2024-08-20 DIAGNOSIS — I4819 Other persistent atrial fibrillation: Secondary | ICD-10-CM | POA: Insufficient documentation

## 2024-08-20 DIAGNOSIS — I2510 Atherosclerotic heart disease of native coronary artery without angina pectoris: Secondary | ICD-10-CM | POA: Diagnosis not present

## 2024-08-20 LAB — CUP PACEART INCLINIC DEVICE CHECK
Date Time Interrogation Session: 20251210111609
Implantable Lead Connection Status: 753985
Implantable Lead Implant Date: 20160308
Implantable Lead Location: 753860
Implantable Lead Model: 295
Implantable Lead Serial Number: 363165
Implantable Pulse Generator Implant Date: 20160308
Lead Channel Setting Pacing Amplitude: 2.4 V
Lead Channel Setting Pacing Pulse Width: 0.5 ms
Lead Channel Setting Sensing Sensitivity: 0.6 mV
Pulse Gen Serial Number: 200269

## 2024-09-08 ENCOUNTER — Ambulatory Visit (INDEPENDENT_AMBULATORY_CARE_PROVIDER_SITE_OTHER): Payer: Medicare Other

## 2024-09-08 DIAGNOSIS — I480 Paroxysmal atrial fibrillation: Secondary | ICD-10-CM | POA: Diagnosis not present

## 2024-09-09 ENCOUNTER — Ambulatory Visit: Payer: Self-pay | Admitting: Cardiology

## 2024-09-09 LAB — CUP PACEART REMOTE DEVICE CHECK
Battery Remaining Longevity: 78 mo
Battery Remaining Percentage: 72 %
Brady Statistic RV Percent Paced: 62 %
Date Time Interrogation Session: 20251229045300
HighPow Impedance: 44 Ohm
Implantable Lead Connection Status: 753985
Implantable Lead Implant Date: 20160308
Implantable Lead Location: 753860
Implantable Lead Model: 295
Implantable Lead Serial Number: 363165
Implantable Pulse Generator Implant Date: 20160308
Lead Channel Impedance Value: 360 Ohm
Lead Channel Pacing Threshold Amplitude: 0.8 V
Lead Channel Pacing Threshold Pulse Width: 0.5 ms
Lead Channel Setting Pacing Amplitude: 2.4 V
Lead Channel Setting Pacing Pulse Width: 0.5 ms
Lead Channel Setting Sensing Sensitivity: 0.6 mV
Pulse Gen Serial Number: 200269

## 2024-09-17 ENCOUNTER — Ambulatory Visit: Admitting: Gastroenterology

## 2024-09-17 NOTE — Progress Notes (Signed)
 Remote ICD Transmission

## 2024-09-20 ENCOUNTER — Other Ambulatory Visit: Payer: Self-pay | Admitting: Family Medicine

## 2024-09-20 DIAGNOSIS — E119 Type 2 diabetes mellitus without complications: Secondary | ICD-10-CM

## 2024-09-22 ENCOUNTER — Encounter: Payer: Self-pay | Admitting: Urology

## 2024-09-22 ENCOUNTER — Ambulatory Visit: Admitting: Gastroenterology

## 2024-09-22 ENCOUNTER — Ambulatory Visit: Admitting: Urology

## 2024-09-22 VITALS — BP 140/70 | HR 45 | Ht 72.0 in | Wt 242.0 lb

## 2024-09-22 DIAGNOSIS — N281 Cyst of kidney, acquired: Secondary | ICD-10-CM

## 2024-09-22 DIAGNOSIS — Z8546 Personal history of malignant neoplasm of prostate: Secondary | ICD-10-CM

## 2024-09-22 DIAGNOSIS — R310 Gross hematuria: Secondary | ICD-10-CM

## 2024-09-22 DIAGNOSIS — N3945 Continuous leakage: Secondary | ICD-10-CM

## 2024-09-22 DIAGNOSIS — N200 Calculus of kidney: Secondary | ICD-10-CM

## 2024-09-22 LAB — URINALYSIS, ROUTINE W REFLEX MICROSCOPIC
Leukocytes,UA: NEGATIVE
Nitrite, UA: NEGATIVE
Specific Gravity, UA: 1.02 (ref 1.005–1.030)
Urobilinogen, Ur: 0.2 mg/dL (ref 0.2–1.0)
pH, UA: 5.5 (ref 5.0–7.5)

## 2024-09-22 LAB — MICROSCOPIC EXAMINATION: RBC, Urine: >30 /HPF — AB (ref 0–2)

## 2024-09-22 NOTE — Progress Notes (Signed)
 "  Assessment: 1. Gross hematuria - negative evaluation 2/25   2. History of prostate cancer   3. Urinary incontinence with continuous leakage   4. Nephrolithiasis   5. Renal cyst, left      Plan: Urologic evaluation for gross hematuria only revealed nonobstructing nephrolithiasis and benign left renal cyst.   His lower urinary tract symptoms remain stable.  He is not interested in any additional treatment at this time. Return to office in 6 months  Chief Complaint:  Chief Complaint  Patient presents with   Hematuria    History of Present Illness:  Bobby Bradley is a 79 y.o. male who is seen for further evaluation of gross hematuria. He had an episode with blood on his incontinence pad in January 2025.  This lasted for 2 days then resolved.  No other associated symptoms including dysuria or flank pain. Urinalysis from 10/05/2023 showed 3-6 RBCs. Urine culture showed no growth. CT renal stone study from 10/10/2023 showed left renal calculi, possible left renal cyst, no evidence of hydronephrosis or ureteral calculi. CT hematuria profile from 10/25/2023 showed 2 nonobstructing calculi in the left kidney with the largest measuring 7 x 10 mm, no right sided renal calculi, no ureteral calculi or obstruction, no filling defect, left renal cyst measuring 2.8 x 3.1 cm.. At his visit in 2/25, he reported no further episodes of gross hematuria.  No flank pain or dysuria.  His urinary symptoms remain stable. IPSS = 15. Cystoscopy from 2/25 showed no urethral or bladder abnormalities. Urine cytology was negative for malignant cells or dysplasia.  He reported an episode of gross hematuria lasting 2 days beginning on 12/15/2023.  He did not pass any clots.  No flank pain or dysuria.  The gross hematuria resolved.  He continued to have frequency, urgency, and incontinence. Hematuria profile from 4/25 showed no evidence of malignant cells or dysplasia, renal tubular cells, isomorphic erythrocytes  indicating lower urinary tract/uroepithelial bleeding, elevated microalbuminuria, proteinuria. Referral to nephrology initiated.  He has a history of prostate cancer and is status post a radical prostatectomy 20+ years ago in [place] . PSA from 1/25: 0.06 PSA from 10/25:  0.04  He has had urinary incontinence following his prostatectomy.  He underwent a male sling a number of years ago.  Since that time, he has had gradual worsening of his incontinence.  He wears a depends throughout the day due to fairly consistent leakage.  He does report frequency, urgency, nocturia.  He voids with a good stream and feels like he empties his bladder completely.  At his visit in July 2025, he reported no further episodes of gross hematuria.  No flank pain or dysuria.  His lower urinary tract symptoms were stable with urgency, frequency, and incontinence.  He continued to void with a good stream. IPSS = 13/3. He is followed by nephrology.  He returns today for follow-up.  No new lower urinary tract symptoms.  He continues to have urgency, frequency, and incontinence.  No dysuria or gross hematuria. IPSS = 15/3.  Portions of the above documentation were copied from a prior visit for review purposes only.   Past Medical History:  Past Medical History:  Diagnosis Date   A-fib [facility])    AICD (automatic cardioverter/defibrillator) present    CHF (congestive heart failure) (HCC)    Colon polyps    Diabetes mellitus without complication (HCC)    Dysrhythmia    Essential hypertension    Hyperlipidemia    Non-ischemic cardiomyopathy (HCC)  Presence of combination internal cardiac defibrillator (ICD) and pacemaker    Presence of permanent cardiac pacemaker    Prostate cancer (HCC)    Stroke (HCC)    TIA- 1994   TIA (transient ischemic attack)     Past Surgical History:  Past Surgical History:  Procedure Laterality Date   BACK SURGERY     CARDIAC CATHETERIZATION     CARDIOVERSION N/A 03/29/2023    Procedure: CARDIOVERSION;  Surgeon: Pietro Redell RAMAN, MD;  [facility];  Service: Cardiovascular;  Laterality: N/A;   CLIPPING OF ATRIAL APPENDAGE N/A 02/26/2023   Procedure: CLIPPING OF LEFT ATRIAL APPENDAGE;  Surgeon: Shyrl Linnie KIDD, MD;  [facility]: [facility];  Service: Open Heart Surgery;  Laterality: N/A;   COLONOSCOPY     about 10 years. [place]     CORONARY ARTERY BYPASS GRAFT N/A 02/26/2023   Procedure: CORONARY ARTERY BYPASS GRAFTING (CABG) X 4 WITH LEFT INTERNAL MAMMARY ARTERY HARVEST AND RIGHT GREATER SAPHENOUS VEIN HARVESTED ENDOSCOPICALLY;  Surgeon: Shyrl Linnie KIDD, MD;  Location: [facility];  Service: Open Heart Surgery;  Laterality: N/A;   ICD IMPLANT  2012   PROSTATECTOMY     RIGHT/LEFT HEART CATH AND CORONARY ANGIOGRAPHY N/A 02/12/2023   Procedure: RIGHT/LEFT HEART CATH AND CORONARY ANGIOGRAPHY;  Surgeon: Verlin Lonni BIRCH, MD;  [facility];  Service: Cardiovascular;  Laterality: N/A;   TEE WITHOUT CARDIOVERSION N/A 02/26/2023   Procedure: TRANSESOPHAGEAL ECHOCARDIOGRAM;  Surgeon: Shyrl Linnie KIDD, MD;  Location: [facility];  Service: Open Heart Surgery;  Laterality: N/A;    Allergies:  Allergies  Allergen Reactions   Lisinopril     Extreme diarrhea    Family History:  Family History  Problem Relation Age of Onset   Lung cancer Mother    Uterine cancer Mother    Lung cancer Father    Atrial fibrillation Sister    Lung cancer Brother    Colon cancer Neg Hx    Esophageal cancer Neg Hx    Pancreatic cancer Neg Hx    Liver disease Neg Hx    Stomach cancer Neg Hx    Rectal cancer Neg Hx     Social History:  Social History   Tobacco Use   Smoking status: Former    Current packs/day: 0.00    Types: Cigarettes    Quit date: 09/12/1983    Years since quitting: 41.0    Passive exposure: Never   Smokeless tobacco: Never  Vaping Use   Vaping status: Never Used  Substance Use Topics    Alcohol use: Yes    Comment: very rare   Drug use: No    ROS: Constitutional:  Negative for fever, chills, weight loss CV: Negative for chest pain, previous MI, hypertension Respiratory:  Negative for shortness of breath, wheezing, sleep apnea, frequent cough GI:  Negative for nausea, vomiting, bloody stool, GERD   Physical exam: BP (!) 140/70   Pulse (!) 45   Ht 6' (1.829 m)   Wt 242 lb (109.8 kg)   BMI 32.82 kg/m  GENERAL APPEARANCE:  Well appearing, well developed, well nourished, NAD HEENT:  Atraumatic, normocephalic, oropharynx clear NECK:  Supple without lymphadenopathy or thyromegaly ABDOMEN:  Soft, non-tender, no masses EXTREMITIES:  Moves all extremities well, without clubbing, cyanosis, or edema NEUROLOGIC:  Alert and oriented x 3, normal gait, CN II-XII grossly intact MENTAL STATUS:  appropriate BACK:  Non-tender to palpation, No CVAT SKIN:  Warm, dry, and  intact  Results: U/A: 0-5 WBCs, >30 RBCs "

## 2024-09-23 ENCOUNTER — Encounter: Payer: Self-pay | Admitting: Gastroenterology

## 2024-09-23 ENCOUNTER — Ambulatory Visit: Admitting: Gastroenterology

## 2024-09-23 VITALS — BP 122/74 | HR 45 | Ht 72.0 in | Wt 231.2 lb

## 2024-09-23 DIAGNOSIS — D509 Iron deficiency anemia, unspecified: Secondary | ICD-10-CM

## 2024-09-23 DIAGNOSIS — K31819 Angiodysplasia of stomach and duodenum without bleeding: Secondary | ICD-10-CM | POA: Diagnosis not present

## 2024-09-23 DIAGNOSIS — Z7901 Long term (current) use of anticoagulants: Secondary | ICD-10-CM

## 2024-09-23 DIAGNOSIS — Z860100 Personal history of colon polyps, unspecified: Secondary | ICD-10-CM

## 2024-09-23 DIAGNOSIS — R5383 Other fatigue: Secondary | ICD-10-CM | POA: Diagnosis not present

## 2024-09-23 DIAGNOSIS — K5520 Angiodysplasia of colon without hemorrhage: Secondary | ICD-10-CM

## 2024-09-23 NOTE — Progress Notes (Signed)
 "  HPI :  79 year old male here for a follow-up visit for iron deficiency anemia. Recall he has a history of A-fib on Eliquis , CHF, ICD in place, EF 40 to 45%, CAD status post CABG, history of TIA, prostate cancer, history of colon polyps, here for follow-up visit for iron deficiency anemia.   Recall he has had a mild iron deficiency anemia in recent years.  Colonoscopy August 2022 with 14 adenomas, he had a follow-up EGD and colonoscopy in 2024 with no high risk lesions or clear cause for anemia, see results below.  He was placed on oral iron supplementation, while his anemia resolved, he had some persistent iron deficiency despite oral iron intake.  In this late he proceeded with a capsule endoscopy in November 2024.  He had multiple small bowel AVMs noted on this exam as the likely cause for his IDA in the setting of Eliquis .  Also diminutive ulcer in his small bowel.  He does take baby aspirin  in addition to Eliquis .  Denies any other regular NSAID use.  He denies any blood in his stools, states his bowels are regular and normal color brown.  He admits to good compliance with his iron.  He tells me within the past few years he has had a CABG for CAD.  He is completed cardiac rehab.  He thought his fatigue and dyspnea was due to CAD at the time of his CABG but states even despite his surgery he continues to have fatigue that bothers him and does not really resolved.  He has been seen by cardiology, had persistent bradycardia and had a pacemaker placed, currently pacing at a heart rate of 45.  He is now off amiodarone  and followed closely by cardiology.  He inquires if his fatigue is due to anemia.    Prior workup:  Colonoscopy 05/10/2021: - The examined portion of the ileum was normal. - Two 3 to 6 mm polyps in the cecum, removed with a cold snare. Resected and retrieved. - One 3 mm polyp in the ascending colon, removed with a cold snare. Resected and retrieved. - Four 3 to 7 mm polyps at the  hepatic flexure, removed with a cold snare. Resected and retrieved. - Four 3 to 5 mm polyps in the transverse colon, removed with a cold snare. Resected and retrieved. - Two 3 to 4 mm polyps in the descending colon, removed with a cold snare. Resected and retrieved. - One 4 mm polyp in the sigmoid colon, removed with a cold snare. Resected and retrieved. - Diverticulosis in the sigmoid colon. - Anal papilla(e) were hypertrophied. - Internal hemorrhoids. - The examination was otherwise normal. - MULTIPLE FRAGMENTS OF TUBULAR ADENOMA. - MULTIPLE FRAGMENTS OF SESSILE SERRATED POLYP WITHOUT DYSPLASIA. - NO HIGH-GRADE DYSPLASIA OR MALIGNANCY.     EGD 10/24/2022: - Esophagogastric landmarks were identified: the Z-line was found at 45 cm, the gastroesophageal junction was found at 45 cm and the upper extent of the gastric folds was found at 47 cm from the incisors. Findings: - A 2 cm hiatal hernia was present. - The exam of the esophagus was otherwise normal. - The entire examined stomach was normal. Biopsies were taken with a cold forceps for Helicobacter pylori testing given iron deficiency anemia - The examined duodenum was normal.   Colonoscopy 10/24/2022: - The perianal and digital rectal examinations were normal. - The terminal ileum appeared normal. - Three flat and sessile polyps were found in the ascending colon. The polyps were 1 to  3 mm in size. These polyps were removed with a cold snare. Resection and retrieval were complete. - A 4 mm polyp was found in the transverse colon. The polyp was flat. The polyp was removed with a cold snare. Resection and retrieval were complete. - A 3 mm polyp was found in the sigmoid colon. The polyp was sessile. The polyp was removed with a cold snare. Resection and retrieval were complete. - Multiple small-mouthed diverticula were found in the left colon. - Internal hemorrhoids were found during retroflexion. The hemorrhoids were small. - The exam was otherwise  without abnormality.   1. Surgical [P], gastric antrum and gastric body - ANTRAL AND OXYNTIC MUCOSA WITH MILD REACTIVE (CHEMICAL) GASTROPATHY - NEGATIVE FOR HELICOBACTER ORGANISMS ON H&E STAIN 2. Surgical [P], colon, ascending, transverse, sigmoid, polyp (5) - TUBULAR ADENOMA(S) (3 FRAGMENTS) - NEGATIVE FOR HIGH-GRADE DYSPLASIA - HYPERPLASTIC POLYP (1 FRAGMENT) - COLONIC MUCOSA WITH SURFACE HYPERPLASTIC CHANGES (MULTIPLE FRAGMENTS) - NEGATIVE FOR MALIGNANCY 2. Multiple deeper levels were examined.   Repeat colon in 3 years     Capsule endoscopy 08/03/23: Multiple AVMs in the small intestine with 1 diminutive ulceration.  I think the AVMs are likely causing his iron deficiency in the setting of Eliquis .  As long as his hemoglobin stays stable on iron I do not think we need to pursue ablation of these.  These are benign and have no cancerous potential, reassuring.    Echocardiogram 07/17/23: 1. Left ventricular ejection fraction, by estimation, is 45 to 50%. The  left ventricle has mildly decreased function. The left ventricle has no  regional wall motion abnormalities. Left ventricular diastolic parameters  are consistent with Grade II  diastolic dysfunction (pseudonormalization). There is moderate hypokinesis  of the left ventricular, basal-mid anterolateral wall.   2. Right ventricular systolic function is normal. The right ventricular  size is normal.   3. Left atrial size was mildly dilated.   4. The mitral valve is normal in structure. No evidence of mitral valve  regurgitation. No evidence of mitral stenosis.   5. The aortic valve is normal in structure. Aortic valve regurgitation is  not visualized. No aortic stenosis is present.   6. The inferior vena cava is normal in size with greater than 50%  respiratory variability, suggesting right atrial pressure of 3 mmHg.     Past Medical History:  Diagnosis Date   A-fib [facility])    AICD (automatic cardioverter/defibrillator)  present    CHF (congestive heart failure) (HCC)    Colon polyps    Diabetes mellitus without complication (HCC)    Dysrhythmia    Essential hypertension    Hyperlipidemia    Non-ischemic cardiomyopathy (HCC)    Presence of combination internal cardiac defibrillator (ICD) and pacemaker    Presence of permanent cardiac pacemaker    Prostate cancer (HCC)    Stroke (HCC)    TIA- 1994   TIA (transient ischemic attack)      Past Surgical History:  Procedure Laterality Date   BACK SURGERY     CARDIAC CATHETERIZATION     CARDIOVERSION N/A 03/29/2023   Procedure: CARDIOVERSION;  Surgeon: Pietro Redell RAMAN, MD;  Location: [facility] INVASIVE CV [facility];  Service: Cardiovascular;  Laterality: N/A;   CLIPPING OF ATRIAL APPENDAGE N/A 02/26/2023   Procedure: CLIPPING OF LEFT ATRIAL APPENDAGE;  Surgeon: Shyrl Linnie KIDD, MD;  Location: [facility] OR;  Service: Open Heart Surgery;  Laterality: N/A;   COLONOSCOPY     about 10 years. [place] in [place]  or [place]     CORONARY ARTERY BYPASS GRAFT N/A 02/26/2023   Procedure: CORONARY ARTERY BYPASS GRAFTING (CABG) X 4 WITH LEFT INTERNAL MAMMARY ARTERY HARVEST AND RIGHT GREATER SAPHENOUS VEIN HARVESTED ENDOSCOPICALLY;  Surgeon: Shyrl Linnie KIDD, MD;  Location: [facility] OR;  Service: Open Heart Surgery;  Laterality: N/A;   ICD IMPLANT  2012   PROSTATECTOMY     RIGHT/LEFT HEART CATH AND CORONARY ANGIOGRAPHY N/A 02/12/2023   Procedure: RIGHT/LEFT HEART CATH AND CORONARY ANGIOGRAPHY;  Surgeon: Verlin Lonni BIRCH, MD;  Location: [facility];  Service: Cardiovascular;  Laterality: N/A;   TEE WITHOUT CARDIOVERSION N/A 02/26/2023   Procedure: TRANSESOPHAGEAL ECHOCARDIOGRAM;  Surgeon: Shyrl Linnie KIDD, MD;  Location: [facility] OR;  Service: Open Heart Surgery;  Laterality: N/A;   Family History  Problem Relation Age of Onset   Lung cancer Mother    Uterine cancer Mother    Lung cancer Father    Atrial fibrillation Sister    Lung cancer Brother    Colon  cancer Neg Hx    Esophageal cancer Neg Hx    Pancreatic cancer Neg Hx    Liver disease Neg Hx    Stomach cancer Neg Hx    Rectal cancer Neg Hx    Social History[1] Current Outpatient Medications  Medication Sig Dispense Refill   apixaban  (ELIQUIS ) 5 MG TABS tablet TAKE 1 TABLET(5 MG) BY MOUTH TWICE DAILY 180 tablet 2   aspirin  EC 81 MG tablet Take 81 mg by mouth daily. Swallow whole.     carvedilol  (COREG ) 25 MG tablet TAKE 1 TABLET(25 MG) BY MOUTH TWICE DAILY 180 tablet 0   cetirizine (ZYRTEC) 10 MG tablet Take 10 mg by mouth daily.     ferrous sulfate  325 (65 FE) MG tablet Take 1 tablet (325 mg total) by mouth daily with breakfast. 90 tablet 3   JARDIANCE 10 MG TABS tablet Take 10 mg by mouth daily.     metFORMIN  (GLUCOPHAGE ) 500 MG tablet TAKE 1 TABLET(500 MG) BY MOUTH TWICE DAILY WITH A MEAL 180 tablet 3   rosuvastatin  (CRESTOR ) 40 MG tablet TAKE 1 TABLET(40 MG) BY MOUTH DAILY 90 tablet 3   sacubitril -valsartan  (ENTRESTO ) 24-26 MG TAKE 1 TABLET BY MOUTH TWICE DAILY 180 tablet 3   tetrahydrozoline 0.05 % ophthalmic solution Place 1 drop into both eyes every morning.     No current facility-administered medications for this visit.   Allergies[2]   Review of Systems: All systems reviewed and negative except where noted in HPI.    Lab Results  Component Value Date   WBC 6.3 07/03/2024   HGB 13.6 07/03/2024   HCT 41.8 07/03/2024   MCV 90.3 07/03/2024   PLT 181.0 07/03/2024    Lab Results  Component Value Date   IRON 70 07/03/2024   TIBC 373.8 07/03/2024   FERRITIN 26.1 07/03/2024       Latest Ref Rng & Units 07/03/2024    9:37 AM 01/04/2024   10:56 AM 10/04/2023    9:38 AM  CBC  WBC 4.0 - 10.5 K/uL 6.3  7.3  6.6   Hemoglobin 13.0 - 17.0 g/dL 86.3  84.5  85.1   Hematocrit 39.0 - 52.0 % 41.8  46.9  45.1   Platelets 150.0 - 400.0 K/uL 181.0  240.0  201.0        Physical Exam: BP 122/74   Pulse (!) 45 Comment: Pt. states his HR has been running low.  Ht 6'  (1.829 m)  Wt 231 lb 4 oz (104.9 kg)   SpO2 97%   BMI 31.36 kg/m  Constitutional: Pleasant,well-developed, male in no acute distress. Neurological: Alert and oriented to person place and time. Psychiatric: Normal mood and affect. Behavior is normal.   ASSESSMENT: 79 y.o. male here for assessment of the following  1. Iron deficiency anemia, unspecified iron deficiency anemia type   2. AVM (arteriovenous malformation) of small bowel, acquired   3. Anticoagulated   4. History of colon polyps   5. Other fatigue    History of iron deficiency anemia status post EGD, colonoscopy, capsule endoscopy.  He has AVMs in the small bowel that are the likely culprit for this in the setting of anticoagulation and baby aspirin  use.  He has responded to oral iron with resolution of his anemia albeit on occasion his ferritin's have been on the lower side however better recently.  Discussed nature of AVMs with him, increased risk for anemia and bleeding in the setting of anticoagulation and baby aspirin .  As long as he responds to oral iron therapy, no plans for further endoscopic therapy or treatment of these.  He understands this, continue iron for now.  We discussed his history of colon polyps, consideration for repeat colonoscopy in 2027 pending the status of his health, in light of his age.  We reviewed his fatigue and exertional dyspnea.  This has been ongoing for a few years now, I do not think related to his anemia or iron deficiency and reassured him of that.  His EF looks okay, seen by cardiology, unclear if this is related to his bradycardia?  Paced at heart rate of 40s.  He will continue to follow-up with cardiology for this issue.  He can see me once yearly if not sooner with other issues  PLAN: - TIBC / ferritin and CBC in March - continue iron - avoid NSAIDS other than baby aspirin  - repeat colonoscopy 3 years from his last exam - as long as Hgb stable then no need for AVM ablation - do  not think fatigue / dyspnea is related to anemia, he will see cardiology (related to bradycardia?)  Marcey Naval, MD [facility]     [1]  Social History Tobacco Use   Smoking status: Former    Current packs/day: 0.00    Types: Cigarettes    Quit date: 09/12/1983    Years since quitting: 41.0    Passive exposure: Never   Smokeless tobacco: Never  Vaping Use   Vaping status: Never Used  Substance Use Topics   Alcohol use: Yes    Comment: very rare   Drug use: No  [2]  Allergies Allergen Reactions   Lisinopril     Extreme diarrhea   "

## 2024-09-23 NOTE — Patient Instructions (Addendum)
 You will be due for labs in March. We will remind you when it is time to go to the lab.   Thank you for entrusting me with your care and for choosing [facility], Dr. Elspeth Naval  _______________________________________________________  If your blood pressure at your visit was 140/90 or greater, please contact your primary care physician to follow up on this.  _______________________________________________________  If you are age 78 or older, your body mass index should be between 23-30. Your Body mass index is 31.36 kg/m. If this is out of the aforementioned range listed, please consider follow up with your Primary Care Provider.  If you are age 42 or younger, your body mass index should be between 19-25. Your Body mass index is 31.36 kg/m. If this is out of the aformentioned range listed, please consider follow up with your Primary Care Provider.   ________________________________________________________  The [facility] providers would like to encourage you to use MYCHART to communicate with providers for non-urgent requests or questions.  Due to long hold times on the telephone, sending your provider a message by [facility] may be a faster and more efficient way to get a response.  Please allow 48 business hours for a response.  Please remember that this is for non-urgent requests.  _______________________________________________________  [facility] is using a team-based approach to care.  Your team is made up of your doctor and two to three APPS. Our APPS (Nurse Practitioners and Physician Assistants) work with your physician to ensure care continuity for you. They are fully qualified to address your health concerns and develop a treatment plan. They communicate directly with your gastroenterologist to care for you. Seeing the Advanced Practice Practitioners on your physician's team can help you by facilitating care more promptly, often allowing for earlier  appointments, access to diagnostic testing, procedures, and other specialty referrals.

## 2024-10-09 LAB — HM DIABETES EYE EXAM

## 2025-02-04 ENCOUNTER — Ambulatory Visit: Admitting: Cardiology

## 2025-03-25 ENCOUNTER — Ambulatory Visit: Admitting: Urology

## 2025-08-26 ENCOUNTER — Ambulatory Visit
